# Patient Record
Sex: Male | Born: 1960 | Race: White | Hispanic: No | Marital: Single | Smoking: Never smoker
Health system: Southern US, Community
[De-identification: ages and names within clinical notes are randomized; demographics above are authoritative.]

## PROBLEM LIST (undated history)

## (undated) DIAGNOSIS — M5136 Other intervertebral disc degeneration, lumbar region: Secondary | ICD-10-CM

## (undated) DIAGNOSIS — M109 Gout, unspecified: Secondary | ICD-10-CM

## (undated) DIAGNOSIS — K219 Gastro-esophageal reflux disease without esophagitis: Secondary | ICD-10-CM

## (undated) DIAGNOSIS — F329 Major depressive disorder, single episode, unspecified: Secondary | ICD-10-CM

## (undated) DIAGNOSIS — F419 Anxiety disorder, unspecified: Secondary | ICD-10-CM

## (undated) DIAGNOSIS — M51369 Other intervertebral disc degeneration, lumbar region without mention of lumbar back pain or lower extremity pain: Secondary | ICD-10-CM

## (undated) DIAGNOSIS — F32A Depression, unspecified: Secondary | ICD-10-CM

## (undated) DIAGNOSIS — M1990 Unspecified osteoarthritis, unspecified site: Secondary | ICD-10-CM

## (undated) DIAGNOSIS — R112 Nausea with vomiting, unspecified: Secondary | ICD-10-CM

## (undated) DIAGNOSIS — Z9889 Other specified postprocedural states: Secondary | ICD-10-CM

## (undated) DIAGNOSIS — T8859XA Other complications of anesthesia, initial encounter: Secondary | ICD-10-CM

## (undated) DIAGNOSIS — T4145XA Adverse effect of unspecified anesthetic, initial encounter: Secondary | ICD-10-CM

## (undated) HISTORY — PX: BACK SURGERY: SHX140

---

## 1991-07-20 HISTORY — PX: HERNIA REPAIR: SHX51

## 1991-07-20 HISTORY — PX: APPENDECTOMY: SHX54

## 2002-05-14 ENCOUNTER — Emergency Department (HOSPITAL_COMMUNITY): Admission: EM | Admit: 2002-05-14 | Discharge: 2002-05-14 | Payer: Self-pay | Admitting: Emergency Medicine

## 2002-05-18 ENCOUNTER — Emergency Department (HOSPITAL_COMMUNITY): Admission: EM | Admit: 2002-05-18 | Discharge: 2002-05-18 | Payer: Self-pay | Admitting: *Deleted

## 2003-03-24 ENCOUNTER — Emergency Department (HOSPITAL_COMMUNITY): Admission: EM | Admit: 2003-03-24 | Discharge: 2003-03-25 | Payer: Self-pay | Admitting: *Deleted

## 2003-07-17 ENCOUNTER — Emergency Department (HOSPITAL_COMMUNITY): Admission: EM | Admit: 2003-07-17 | Discharge: 2003-07-17 | Payer: Self-pay | Admitting: Internal Medicine

## 2005-08-23 ENCOUNTER — Emergency Department (HOSPITAL_COMMUNITY): Admission: EM | Admit: 2005-08-23 | Discharge: 2005-08-23 | Payer: Self-pay | Admitting: Emergency Medicine

## 2007-06-06 ENCOUNTER — Emergency Department (HOSPITAL_COMMUNITY): Admission: EM | Admit: 2007-06-06 | Discharge: 2007-06-06 | Payer: Self-pay | Admitting: Emergency Medicine

## 2007-06-14 ENCOUNTER — Emergency Department (HOSPITAL_COMMUNITY): Admission: EM | Admit: 2007-06-14 | Discharge: 2007-06-14 | Payer: Self-pay | Admitting: Emergency Medicine

## 2008-07-19 HISTORY — PX: CHOLECYSTECTOMY: SHX55

## 2008-09-03 ENCOUNTER — Emergency Department (HOSPITAL_COMMUNITY): Admission: EM | Admit: 2008-09-03 | Discharge: 2008-09-04 | Payer: Self-pay | Admitting: Emergency Medicine

## 2008-10-07 ENCOUNTER — Ambulatory Visit (HOSPITAL_COMMUNITY): Admission: RE | Admit: 2008-10-07 | Discharge: 2008-10-07 | Payer: Self-pay | Admitting: Pulmonary Disease

## 2008-12-23 ENCOUNTER — Emergency Department (HOSPITAL_COMMUNITY): Admission: EM | Admit: 2008-12-23 | Discharge: 2008-12-23 | Payer: Self-pay | Admitting: Emergency Medicine

## 2009-02-12 ENCOUNTER — Emergency Department (HOSPITAL_COMMUNITY): Admission: EM | Admit: 2009-02-12 | Discharge: 2009-02-12 | Payer: Self-pay | Admitting: Emergency Medicine

## 2010-10-25 LAB — DIFFERENTIAL
Basophils Absolute: 0 10*3/uL (ref 0.0–0.1)
Basophils Relative: 1 % (ref 0–1)
Eosinophils Absolute: 0 10*3/uL (ref 0.0–0.7)
Eosinophils Relative: 1 % (ref 0–5)
Lymphocytes Relative: 26 % (ref 12–46)
Lymphs Abs: 1.2 10*3/uL (ref 0.7–4.0)
Monocytes Absolute: 0.3 10*3/uL (ref 0.1–1.0)
Monocytes Relative: 7 % (ref 3–12)
Neutro Abs: 2.9 10*3/uL (ref 1.7–7.7)
Neutrophils Relative %: 65 % (ref 43–77)

## 2010-10-25 LAB — CBC
HCT: 38.1 % — ABNORMAL LOW (ref 39.0–52.0)
Hemoglobin: 13.5 g/dL (ref 13.0–17.0)
MCHC: 35.5 g/dL (ref 30.0–36.0)
MCV: 88.2 fL (ref 78.0–100.0)
Platelets: 210 10*3/uL (ref 150–400)
RBC: 4.32 MIL/uL (ref 4.22–5.81)
RDW: 12.6 % (ref 11.5–15.5)
WBC: 4.5 10*3/uL (ref 4.0–10.5)

## 2010-10-25 LAB — COMPREHENSIVE METABOLIC PANEL
ALT: 12 U/L (ref 0–53)
AST: 22 U/L (ref 0–37)
Albumin: 3.9 g/dL (ref 3.5–5.2)
Alkaline Phosphatase: 49 U/L (ref 39–117)
BUN: 6 mg/dL (ref 6–23)
CO2: 31 mEq/L (ref 19–32)
Calcium: 8.8 mg/dL (ref 8.4–10.5)
Chloride: 100 mEq/L (ref 96–112)
Creatinine, Ser: 0.8 mg/dL (ref 0.4–1.5)
GFR calc Af Amer: >60 mL/min (ref >60)
GFR calc non Af Amer: >60 mL/min (ref >60)
Glucose, Bld: 86 mg/dL (ref 70–99)
Potassium: 3.6 mEq/L (ref 3.5–5.1)
Sodium: 135 mEq/L (ref 135–145)
Total Bilirubin: 1.4 mg/dL — ABNORMAL HIGH (ref 0.3–1.2)
Total Protein: 6.2 g/dL (ref 6.0–8.3)

## 2010-10-25 LAB — URINALYSIS, ROUTINE W REFLEX MICROSCOPIC
Bilirubin Urine: NEGATIVE
Glucose, UA: NEGATIVE mg/dL
Hgb urine dipstick: NEGATIVE
Ketones, ur: NEGATIVE mg/dL
Nitrite: NEGATIVE
Protein, ur: NEGATIVE mg/dL
Specific Gravity, Urine: <1.005 — ABNORMAL LOW (ref 1.005–1.030)
Urobilinogen, UA: 0.2 mg/dL (ref 0.0–1.0)
pH: 5.5 (ref 5.0–8.0)

## 2010-10-25 LAB — LIPASE, BLOOD: Lipase: 25 U/L (ref 11–59)

## 2010-11-03 LAB — URINALYSIS, ROUTINE W REFLEX MICROSCOPIC
Bilirubin Urine: NEGATIVE
Glucose, UA: NEGATIVE mg/dL
Hgb urine dipstick: NEGATIVE
Ketones, ur: NEGATIVE mg/dL
Nitrite: NEGATIVE
Protein, ur: NEGATIVE mg/dL
Specific Gravity, Urine: <1.005 — ABNORMAL LOW (ref 1.005–1.030)
Urobilinogen, UA: 0.2 mg/dL (ref 0.0–1.0)
pH: 5 (ref 5.0–8.0)

## 2010-11-03 LAB — CBC
HCT: 41.8 % (ref 39.0–52.0)
Hemoglobin: 14.5 g/dL (ref 13.0–17.0)
MCHC: 34.8 g/dL (ref 30.0–36.0)
MCV: 87.5 fL (ref 78.0–100.0)
Platelets: 210 10*3/uL (ref 150–400)
RBC: 4.77 MIL/uL (ref 4.22–5.81)
RDW: 12.5 % (ref 11.5–15.5)
WBC: 5.6 10*3/uL (ref 4.0–10.5)

## 2010-11-03 LAB — BASIC METABOLIC PANEL
BUN: 5 mg/dL — ABNORMAL LOW (ref 6–23)
CO2: 27 mEq/L (ref 19–32)
Calcium: 9.1 mg/dL (ref 8.4–10.5)
Chloride: 103 mEq/L (ref 96–112)
Creatinine, Ser: 0.9 mg/dL (ref 0.4–1.5)
GFR calc Af Amer: >60 mL/min (ref >60)
GFR calc non Af Amer: >60 mL/min (ref >60)
Glucose, Bld: 87 mg/dL (ref 70–99)
Potassium: 3.7 mEq/L (ref 3.5–5.1)
Sodium: 138 mEq/L (ref 135–145)

## 2010-11-03 LAB — DIFFERENTIAL
Basophils Absolute: 0 10*3/uL (ref 0.0–0.1)
Basophils Relative: 0 % (ref 0–1)
Eosinophils Absolute: 0.1 10*3/uL (ref 0.0–0.7)
Eosinophils Relative: 1 % (ref 0–5)
Lymphocytes Relative: 27 % (ref 12–46)
Lymphs Abs: 1.5 10*3/uL (ref 0.7–4.0)
Monocytes Absolute: 0.4 10*3/uL (ref 0.1–1.0)
Monocytes Relative: 8 % (ref 3–12)
Neutro Abs: 3.6 10*3/uL (ref 1.7–7.7)
Neutrophils Relative %: 64 % (ref 43–77)

## 2010-11-03 LAB — POCT CARDIAC MARKERS
CKMB, poc: 2 ng/mL (ref 1.0–8.0)
Myoglobin, poc: 132 ng/mL (ref 12–200)
Troponin i, poc: <0.05 ng/mL (ref 0.00–0.09)

## 2012-01-08 ENCOUNTER — Emergency Department (HOSPITAL_COMMUNITY)
Admission: EM | Admit: 2012-01-08 | Discharge: 2012-01-08 | Disposition: A | Discharge status: Home / Self Care | Payer: Self-pay | Attending: Emergency Medicine | Admitting: Emergency Medicine

## 2012-01-08 ENCOUNTER — Emergency Department (HOSPITAL_COMMUNITY): Payer: Self-pay

## 2012-01-08 ENCOUNTER — Encounter (HOSPITAL_COMMUNITY): Payer: Self-pay | Admitting: *Deleted

## 2012-01-08 DIAGNOSIS — R531 Weakness: Secondary | ICD-10-CM

## 2012-01-08 DIAGNOSIS — R51 Headache: Secondary | ICD-10-CM | POA: Insufficient documentation

## 2012-01-08 DIAGNOSIS — M109 Gout, unspecified: Secondary | ICD-10-CM | POA: Insufficient documentation

## 2012-01-08 DIAGNOSIS — R112 Nausea with vomiting, unspecified: Secondary | ICD-10-CM | POA: Insufficient documentation

## 2012-01-08 DIAGNOSIS — R5381 Other malaise: Secondary | ICD-10-CM | POA: Insufficient documentation

## 2012-01-08 DIAGNOSIS — R109 Unspecified abdominal pain: Secondary | ICD-10-CM | POA: Insufficient documentation

## 2012-01-08 HISTORY — DX: Unspecified osteoarthritis, unspecified site: M19.90

## 2012-01-08 HISTORY — DX: Gout, unspecified: M10.9

## 2012-01-08 LAB — DIFFERENTIAL
Basophils Absolute: 0 10*3/uL (ref 0.0–0.1)
Basophils Relative: 1 % (ref 0–1)
Eosinophils Absolute: 0.1 10*3/uL (ref 0.0–0.7)
Eosinophils Relative: 3 % (ref 0–5)
Lymphocytes Relative: 32 % (ref 12–46)
Lymphs Abs: 1.3 10*3/uL (ref 0.7–4.0)
Monocytes Absolute: 0.4 10*3/uL (ref 0.1–1.0)
Monocytes Relative: 9 % (ref 3–12)
Neutro Abs: 2.3 10*3/uL (ref 1.7–7.7)
Neutrophils Relative %: 56 % (ref 43–77)

## 2012-01-08 LAB — CBC
HCT: 39.2 % (ref 39.0–52.0)
Hemoglobin: 13.8 g/dL (ref 13.0–17.0)
MCH: 29.4 pg (ref 26.0–34.0)
MCHC: 35.2 g/dL (ref 30.0–36.0)
MCV: 83.4 fL (ref 78.0–100.0)
Platelets: 193 10*3/uL (ref 150–400)
RBC: 4.7 MIL/uL (ref 4.22–5.81)
RDW: 12 % (ref 11.5–15.5)
WBC: 4.1 10*3/uL (ref 4.0–10.5)

## 2012-01-08 LAB — URINALYSIS, ROUTINE W REFLEX MICROSCOPIC
Nitrite: NEGATIVE
Protein, ur: NEGATIVE mg/dL
Specific Gravity, Urine: 1.01 (ref 1.005–1.030)
Urobilinogen, UA: 0.2 mg/dL (ref 0.0–1.0)

## 2012-01-08 LAB — BASIC METABOLIC PANEL
BUN: 7 mg/dL (ref 6–23)
CO2: 30 mEq/L (ref 19–32)
Calcium: 9.6 mg/dL (ref 8.4–10.5)
Chloride: 97 mEq/L (ref 96–112)
Creatinine, Ser: 0.99 mg/dL (ref 0.50–1.35)
GFR calc Af Amer: >90 mL/min (ref >90)
GFR calc non Af Amer: >90 mL/min (ref >90)
Glucose, Bld: 83 mg/dL (ref 70–99)
Potassium: 4.2 mEq/L (ref 3.5–5.1)
Sodium: 134 mEq/L — ABNORMAL LOW (ref 135–145)

## 2012-01-08 LAB — CK: Total CK: 139 U/L (ref 7–232)

## 2012-01-08 LAB — TROPONIN I: Troponin I: <0.3 ng/mL (ref <0.30)

## 2012-01-08 MED ORDER — KETOROLAC TROMETHAMINE 30 MG/ML IJ SOLN
30.0000 mg | Freq: Once | INTRAMUSCULAR | Status: AC
  Administered 2012-01-08: 30 mg via INTRAVENOUS
  Filled 2012-01-08: qty 1

## 2012-01-08 MED ORDER — SODIUM CHLORIDE 0.9 % IV BOLUS (SEPSIS)
1000.0000 mL | Freq: Once | INTRAVENOUS | Status: AC
  Administered 2012-01-08: 1000 mL via INTRAVENOUS

## 2012-01-08 MED ORDER — PROMETHAZINE HCL 25 MG PO TABS: 25.0000 mg | ORAL_TABLET | Freq: Four times a day (QID) | ORAL | Status: DC | PRN

## 2012-01-08 MED ORDER — ONDANSETRON HCL 4 MG/2ML IJ SOLN
4.0000 mg | Freq: Once | INTRAMUSCULAR | Status: AC
  Administered 2012-01-08: 4 mg via INTRAVENOUS
  Filled 2012-01-08: qty 2

## 2012-01-08 NOTE — ED Provider Notes (Cosign Needed)
History    This chart was scribed for Ward Givens, MD, MD by Smitty Pluck. The patient was seen in room APA01 and the patient's care was started at 2:10PM.   CSN: [id number]  Arrival date & time 01/08/12  1253   First MD Initiated Contact with Patient 01/08/12 1352      Chief Complaint  Patient presents with  . Emesis  . Abdominal Pain    (Consider location/radiation/quality/duration/timing/severity/associated sxs/prior treatment) Patient is a 51 y.o. male presenting with vomiting and abdominal pain. The history is provided by the patient.  Emesis  Associated symptoms include abdominal pain.  Abdominal Pain The primary symptoms of the illness include abdominal pain and vomiting.   Montrey Buist Sortor is a 51 y.o. male who presents to the Emergency Department complaining of generalized weakness onset 6 days ago, abdominal pain and nausea with emesis onset 3 days ago. Pt reports having emesis 4x. Pt thinks he might have had a fever. The head pain is located on right side of head. He has had headaches like this in the past but this one is worse. Pain is throbbing. Denies visual changes. Pt denies trouble walking. Symptoms have been constant since onset. Pt denies radiation. He states he does not have energy like normal. Pt reports having mild cough. Denies sore throat and rhinorrhea. Denies sick contact. He has had cholecystomy and appendectomy.   PCP Dr. Juanetta Gosling   Past Medical History  Diagnosis Date  . Arthritis   . Gout     Past Surgical History  Procedure Date  . Cholecystectomy   . Appendectomy   . Hernia repair     No family history on file.  History  Substance Use Topics  . Smoking status: Never Smoker   . Smokeless tobacco: Not on file  . 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  All other systems reviewed and are negative.  10 Systems reviewed and all  are negative for acute change except as noted in the HPI.    Allergies  Penicillins  Home Medications   Current Outpatient Rx  Name Route Sig Dispense Refill  . PROMETHAZINE HCL 25 MG PO TABS Oral Take 1 tablet (25 mg total) by mouth every 6 (six) hours as needed for nausea. 6 tablet 0    BP 125/75  Pulse 60  Temp 97.6 F (36.4 C) (Oral)  Resp 16  Ht 5\' 8"  (1.727 m)  Wt 167 lb (75.751 kg)  BMI 25.39 kg/m2  SpO2 100%  Vital signs normal    Physical Exam  Nursing note and vitals reviewed. Constitutional: He is oriented to person, place, and time. He appears well-developed and well-nourished. He is cooperative. No distress.  HENT:  Head: Normocephalic and atraumatic.  Right Ear: External ear normal.  Left Ear: External ear normal.  Nose: Nose normal.  Mouth/Throat: Oropharynx is clear and moist.  Eyes: Conjunctivae and EOM are normal. Pupils are equal, round, and reactive to light.  Neck: Normal range of motion and full passive range of motion without pain. Neck supple.  Cardiovascular: Normal rate, regular rhythm, normal heart sounds and intact distal pulses.   No murmur heard. Pulmonary/Chest: Effort normal and breath sounds normal. Not tachypneic. No respiratory distress. He has no wheezes. He has no rales.  Abdominal: He exhibits no distension. There is tenderness (mild around umbilicus ).  Surgical scars around umbilicus  Musculoskeletal: Normal range of motion. He exhibits no edema and no tenderness.  Neurological: He is alert and oriented to person, place, and time. He has normal strength and normal reflexes. No cranial nerve deficit.  Skin: Skin is warm, dry and intact.  Psychiatric: His speech is normal and behavior is normal. Thought content normal.       Flat affect    ED Course  Procedures (including critical care time)   Medications  sodium chloride 0.9 % bolus 1,000 mL (1000 mL Intravenous Given 01/08/12 1453)   Orthostatic VS are  normal   DIAGNOSTIC STUDIES: Oxygen Saturation is 100% on room air, normal by my interpretation.    COORDINATION OF CARE: 2:17PM EDP discusses pt ED treatment with pt.   3:00PM EDP ordered medication: 0.9% NaCl infusion  Pt requesting work note  Results for orders placed during the hospital encounter of 01/08/12  CBC      Component Value Range   WBC 4.1  4.0 - 10.5 K/uL   RBC 4.70  4.22 - 5.81 MIL/uL   Hemoglobin 13.8  13.0 - 17.0 g/dL   HCT 16.1  09.6 - 04.5 %   MCV 83.4  78.0 - 100.0 fL   MCH 29.4  26.0 - 34.0 pg   MCHC 35.2  30.0 - 36.0 g/dL   RDW 40.9  81.1 - 91.4 %   Platelets 193  150 - 400 K/uL  DIFFERENTIAL      Component Value Range   Neutrophils Relative 56  43 - 77 %   Neutro Abs 2.3  1.7 - 7.7 K/uL   Lymphocytes Relative 32  12 - 46 %   Lymphs Abs 1.3  0.7 - 4.0 K/uL   Monocytes Relative 9  3 - 12 %   Monocytes Absolute 0.4  0.1 - 1.0 K/uL   Eosinophils Relative 3  0 - 5 %   Eosinophils Absolute 0.1  0.0 - 0.7 K/uL   Basophils Relative 1  0 - 1 %   Basophils Absolute 0.0  0.0 - 0.1 K/uL  BASIC METABOLIC PANEL      Component Value Range   Sodium 134 (*) 135 - 145 mEq/L   Potassium 4.2  3.5 - 5.1 mEq/L   Chloride 97  96 - 112 mEq/L   CO2 30  19 - 32 mEq/L   Glucose, Bld 83  70 - 99 mg/dL   BUN 7  6 - 23 mg/dL   Creatinine, Ser 7.82  0.50 - 1.35 mg/dL   Calcium 9.6  8.4 - 95.6 mg/dL   GFR calc non Af Amer >90  >90 mL/min   GFR calc Af Amer >90  >90 mL/min  TROPONIN I      Component Value Range   Troponin I <0.30  <0.30 ng/mL  URINALYSIS, ROUTINE W REFLEX MICROSCOPIC      Component Value Range   Color, Urine YELLOW  YELLOW   APPearance CLEAR  CLEAR   Specific Gravity, Urine 1.010  1.005 - 1.030   pH 7.0  5.0 - 8.0   Glucose, UA NEGATIVE  NEGATIVE mg/dL   Hgb urine dipstick NEGATIVE  NEGATIVE   Bilirubin Urine NEGATIVE  NEGATIVE   Ketones, ur NEGATIVE  NEGATIVE mg/dL   Protein, ur NEGATIVE  NEGATIVE mg/dL   Urobilinogen, UA 0.2  0.0 - 1.0 mg/dL    Nitrite NEGATIVE  NEGATIVE   Leukocytes, UA NEGATIVE  NEGATIVE  CK  Component Value Range   Total CK 139  7 - 232 U/L   Laboratory interpretation all normal   Dg Chest 2 View  01/08/2012  *RADIOLOGY REPORT*  Clinical Data: 51 year old male with weakness and cough.  CHEST - 2 VIEW  Comparison: 07/04/2011 prior chest radiographs  Findings: The cardiomediastinal silhouette is unremarkable. The lungs are clear. There is no evidence of focal airspace disease, pulmonary edema, suspicious pulmonary nodule/mass, pleural effusion, or pneumothorax. No acute bony abnormalities are identified.  IMPRESSION: No evidence of active cardiopulmonary disease.  Original Report Authenticated By: Rosendo Gros, M.D.    Date: 01/08/2012  Rate: 51  Rhythm: sinus bradycardia  QRS Axis: normal  Intervals: normal  ST/T Wave abnormalities: normal  Conduction Disutrbances:none  Narrative Interpretation:   Old EKG Reviewed: unchanged from 09/03/2008 HR was 61     1. Weakness   2. Nausea and vomiting   3. Headache    New Prescriptions   PROMETHAZINE (PHENERGAN) 25 MG TABLET    Take 1 tablet (25 mg total) by mouth every 6 (six) hours as needed for nausea.    Plan discharge  Devoria Albe, MD, FACEP    MDM    I personally performed the services described in this documentation, which was scribed in my presence. The recorded information has been reviewed and considered.  Devoria Albe, MD, Armando Gang        Ward Givens, MD 01/08/12 [phone]

## 2012-01-08 NOTE — Discharge Instructions (Signed)
Drink plenty of fluids (clear liquids) the next 12-24 hours then start the BRAT diet. Use the phenergan for nausea or vomiting.  Recheck if you get worse. Have Dr Juanetta Gosling recheck you if you aren't feeling better over the weekend.

## 2012-01-08 NOTE — ED Notes (Signed)
Patient advised he did have some slight dizziness upon sitting upright and standing.

## 2012-01-08 NOTE — ED Notes (Signed)
Pt states vomiting, nausea and abdominal pain began Sunday, along with generalized weakness and headache. NAD.

## 2012-04-14 ENCOUNTER — Emergency Department (HOSPITAL_COMMUNITY): Payer: Self-pay

## 2012-04-14 ENCOUNTER — Emergency Department (HOSPITAL_COMMUNITY)
Admission: EM | Admit: 2012-04-14 | Discharge: 2012-04-14 | Disposition: A | Discharge status: Home / Self Care | Payer: Self-pay | Attending: Emergency Medicine | Admitting: Emergency Medicine

## 2012-04-14 ENCOUNTER — Encounter (HOSPITAL_COMMUNITY): Payer: Self-pay | Admitting: Emergency Medicine

## 2012-04-14 DIAGNOSIS — X500XXA Overexertion from strenuous movement or load, initial encounter: Secondary | ICD-10-CM | POA: Insufficient documentation

## 2012-04-14 DIAGNOSIS — S93409A Sprain of unspecified ligament of unspecified ankle, initial encounter: Secondary | ICD-10-CM | POA: Insufficient documentation

## 2012-04-14 DIAGNOSIS — M129 Arthropathy, unspecified: Secondary | ICD-10-CM | POA: Insufficient documentation

## 2012-04-14 DIAGNOSIS — S93402A Sprain of unspecified ligament of left ankle, initial encounter: Secondary | ICD-10-CM

## 2012-04-14 DIAGNOSIS — Y990 Civilian activity done for income or pay: Secondary | ICD-10-CM | POA: Insufficient documentation

## 2012-04-14 DIAGNOSIS — Y9239 Other specified sports and athletic area as the place of occurrence of the external cause: Secondary | ICD-10-CM | POA: Insufficient documentation

## 2012-04-14 DIAGNOSIS — M109 Gout, unspecified: Secondary | ICD-10-CM | POA: Insufficient documentation

## 2012-04-14 MED ORDER — OXYCODONE-ACETAMINOPHEN 5-325 MG PO TABS: 1.0000 | ORAL_TABLET | ORAL | Status: DC | PRN

## 2012-04-14 NOTE — ED Provider Notes (Signed)
History     CSN: [id number]  Arrival date & time 04/14/12  0029   First MD Initiated Contact with Patient 04/14/12 0141      Chief Complaint  Patient presents with  . Ankle Pain     Patient is a 51 y.o. male presenting with ankle pain. The history is provided by the patient.  Ankle Pain  The incident occurred yesterday. The incident occurred at work. Injury mechanism: twisting injury. The pain is present in the left ankle. The pain is moderate. The pain has been constant since onset. Pertinent negatives include no muscle weakness. The symptoms are aggravated by activity and bearing weight. He has tried rest for the symptoms. The treatment provided mild relief.    Past Medical History  Diagnosis Date  . Arthritis   . Gout   . Gout     Past Surgical History  Procedure Date  . Cholecystectomy   . Appendectomy   . Hernia repair     No family history on file.  History  Substance Use Topics  . Smoking status: Never Smoker   . Smokeless tobacco: Not on file  . Alcohol Use: No      Review of Systems  Musculoskeletal: Positive for joint swelling.  Neurological: Negative for weakness.    Allergies  Penicillins  Home Medications   Current Outpatient Rx  Name Route Sig Dispense Refill  . OXYCODONE-ACETAMINOPHEN 5-325 MG PO TABS Oral Take 1 tablet by mouth every 4 (four) hours as needed for pain. 15 tablet 0  . PROMETHAZINE HCL 25 MG PO TABS Oral Take 1 tablet (25 mg total) by mouth every 6 (six) hours as needed for nausea. 6 tablet 0    BP 152/88  Pulse 69  Temp 98 F (36.7 C) (Oral)  Resp 18  Ht 5' 8.5" (1.74 m)  Wt 171 lb (77.565 kg)  BMI 25.62 kg/m2  SpO2 100%  Physical Exam CONSTITUTIONAL: Well developed/well nourished HEAD AND FACE: Normocephalic/atraumatic EYES: EOMI ENMT: Mucous membranes moist NECK: supple no meningeal signs LUNGS: , no apparent distress ABDOMEN: soft NEURO: Pt is awake/alert, moves all extremitiesx4 EXTREMITIES: pulses  normal, full ROM, tenderness to palpation of left lateral malleolus, no open skin, no left prox fib tenderness.  Left achilles intact SKIN: warm, color normal PSYCH: no abnormalities of mood noted  ED Course  Procedures  Labs Reviewed - No data to display Dg Ankle Complete Left  04/14/2012  *RADIOLOGY REPORT*  Clinical Data: 51 year old male medial malleolus pain, redness, swelling.  LEFT ANKLE COMPLETE - 3+ VIEW  Comparison: None.  Findings: Mortise joint alignment preserved.  Talar dome intact. Chronic ossific fragments at the medial and lateral malleolus. Evidence of ankle joint effusion.  Moderate to severe degenerative changes in the tarsal articulations.  Calcaneus intact.  Calcified atherosclerosis.  There is soft tissue swelling medially.  IMPRESSION: Soft tissue swelling and evidence of joint effusion.  No acute fracture or dislocation.  Age advanced mid foot degenerative changes and atherosclerosis.   Original Report Authenticated By: Harley Hallmark, M.D.      1. Left ankle sprain       MDM  Nursing notes including past medical history and social history reviewed and considered in documentation xrays reviewed and considered         Joya Gaskins, MD 04/14/12 [phone]

## 2012-04-14 NOTE — ED Notes (Signed)
Patient c/o left ankle pain x 2 days.

## 2012-04-14 NOTE — ED Notes (Signed)
Pt reports left ankle pain that has increased over the past 2 days. Pt states he is on his feet for 8 hours a day. Pt thinks it may be swollen some. No injury or new activity.

## 2012-04-14 NOTE — ED Notes (Signed)
Pt alert & oriented x4, Patient given discharge instructions, paperwork & prescription(s). Patient instructed to stop at the registration desk to finish any additional paperwork. Patient verbalized understanding. Pt left department w/ no further questions.  

## 2012-04-20 ENCOUNTER — Ambulatory Visit: Payer: Self-pay | Admitting: Orthopedic Surgery

## 2012-05-28 ENCOUNTER — Encounter (HOSPITAL_COMMUNITY): Payer: Self-pay | Admitting: Emergency Medicine

## 2012-05-28 ENCOUNTER — Emergency Department (HOSPITAL_COMMUNITY): Payer: Self-pay

## 2012-05-28 ENCOUNTER — Emergency Department (HOSPITAL_COMMUNITY)
Admission: EM | Admit: 2012-05-28 | Discharge: 2012-05-28 | Disposition: A | Discharge status: Home / Self Care | Payer: Self-pay | Attending: Emergency Medicine | Admitting: Emergency Medicine

## 2012-05-28 DIAGNOSIS — M25519 Pain in unspecified shoulder: Secondary | ICD-10-CM | POA: Insufficient documentation

## 2012-05-28 DIAGNOSIS — Z8639 Personal history of other endocrine, nutritional and metabolic disease: Secondary | ICD-10-CM | POA: Insufficient documentation

## 2012-05-28 DIAGNOSIS — Z862 Personal history of diseases of the blood and blood-forming organs and certain disorders involving the immune mechanism: Secondary | ICD-10-CM | POA: Insufficient documentation

## 2012-05-28 DIAGNOSIS — M129 Arthropathy, unspecified: Secondary | ICD-10-CM | POA: Insufficient documentation

## 2012-05-28 MED ORDER — HYDROCODONE-ACETAMINOPHEN 5-325 MG PO TABS
2.0000 | ORAL_TABLET | Freq: Once | ORAL | Status: AC
  Administered 2012-05-28: 2 via ORAL
  Filled 2012-05-28: qty 2

## 2012-05-28 MED ORDER — HYDROCODONE-ACETAMINOPHEN 5-325 MG PO TABS: 1.0000 | ORAL_TABLET | ORAL | Status: AC | PRN

## 2012-05-28 NOTE — ED Notes (Signed)
Patient transported to X-ray 

## 2012-05-28 NOTE — ED Notes (Signed)
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
## 2012-05-28 NOTE — ED Notes (Signed)
Pt states he started having right shoulder pain about 1 week ago.  States he has a history of shoulder pain, but this time his right shoulder pain is increased.

## 2012-06-11 ENCOUNTER — Encounter (HOSPITAL_COMMUNITY): Payer: Self-pay | Admitting: *Deleted

## 2012-06-11 ENCOUNTER — Emergency Department (HOSPITAL_COMMUNITY): Payer: Self-pay

## 2012-06-11 ENCOUNTER — Emergency Department (HOSPITAL_COMMUNITY)
Admission: EM | Admit: 2012-06-11 | Discharge: 2012-06-11 | Disposition: A | Discharge status: Home / Self Care | Payer: Self-pay | Attending: Emergency Medicine | Admitting: Emergency Medicine

## 2012-06-11 DIAGNOSIS — Z79899 Other long term (current) drug therapy: Secondary | ICD-10-CM | POA: Insufficient documentation

## 2012-06-11 DIAGNOSIS — M109 Gout, unspecified: Secondary | ICD-10-CM | POA: Insufficient documentation

## 2012-06-11 DIAGNOSIS — M129 Arthropathy, unspecified: Secondary | ICD-10-CM | POA: Insufficient documentation

## 2012-06-11 MED ORDER — PREDNISONE 10 MG PO TABS: ORAL_TABLET | ORAL | Status: DC

## 2012-06-11 MED ORDER — PREDNISONE 50 MG PO TABS
60.0000 mg | ORAL_TABLET | Freq: Once | ORAL | Status: AC
  Administered 2012-06-11: 60 mg via ORAL
  Filled 2012-06-11: qty 1

## 2012-06-11 MED ORDER — OXYCODONE-ACETAMINOPHEN 5-325 MG PO TABS: 1.0000 | ORAL_TABLET | ORAL | Status: DC | PRN

## 2012-06-11 NOTE — ED Notes (Signed)
Swelling to left hand x 3 days.  Denies injury.

## 2012-06-11 NOTE — ED Provider Notes (Signed)
History     CSN: [id number]  Arrival date & time 06/11/12  1139   First MD Initiated Contact with Patient 06/11/12 1305      Chief Complaint  Patient presents with  . Hand Pain    (Consider location/radiation/quality/duration/timing/severity/associated sxs/prior treatment) HPI Comments: Jared Beck presents with a 3 day history of increased pain,  Swelling and redness to his left 4th finger and dorsal hand which he states is consistent with an acute gouty flare.  His pain is constant and without radiation.  He has taken motrin with no relief of symptoms. He denies fever and  injury. He does have a history of chronic deformity and decreased mobility of his 4th and 5th fingers on this hand secondary to arthritis changes and gout tophi which is not new or different today.  He has taken colchicine and allopurinol in the past for chronic suppression of his gout, but not in recent months.    The history is provided by the patient.    Past Medical History  Diagnosis Date  . Arthritis   . Gout   . Gout     Past Surgical History  Procedure Date  . Cholecystectomy   . Appendectomy   . Hernia repair     No family history on file.  History  Substance Use Topics  . Smoking status: Never Smoker   . Smokeless tobacco: Not on file  . Alcohol Use: No      Review of Systems  Musculoskeletal: Positive for joint swelling and arthralgias.  Skin: Positive for color change. Negative for rash and wound.  Neurological: Negative for weakness and numbness.    Allergies  Penicillins  Home Medications   Current Outpatient Rx  Name  Route  Sig  Dispense  Refill  . IBUPROFEN 200 MG PO TABS   Oral   Take 400 mg by mouth every 8 (eight) hours as needed. pain         . OXYCODONE-ACETAMINOPHEN 5-325 MG PO TABS   Oral   Take 1 tablet by mouth every 4 (four) hours as needed for pain.   20 tablet   0   . PREDNISONE 10 MG PO TABS      6, 5, 4, 3, 2 then 1 tablet by mouth daily  for 6 days total.   21 tablet   0     BP 120/86  Pulse 82  Temp 97.2 F (36.2 C) (Oral)  Resp 16  Ht 5\' 8"  (1.727 m)  Wt 172 lb (78.019 kg)  BMI 26.15 kg/m2  SpO2 99%  Physical Exam  Constitutional: He appears well-developed and well-nourished.  HENT:  Head: Atraumatic.  Neck: Normal range of motion.  Cardiovascular:       Pulses equal bilaterally  Musculoskeletal: He exhibits edema and tenderness.  Lymphadenopathy:    He has no cervical adenopathy.    He has no axillary adenopathy.       Left: No epitrochlear adenopathy present.  Neurological: He is alert. He has normal strength. He displays normal reflexes. No sensory deficit.       Equal strength  Skin: Skin is warm and dry. There is erythema.       Slight erythema and edema involving pip of left 4th finger with erythema and edema with increased warm to dorsal base of left hand.  There is no rash, lesions or evidence of skin disruption or injury.  Radial pulse intact.  Less than 3 sec cap refill.  Tophi  noted on 4th and 5th dip joints.  TTP.  Psychiatric: He has a normal mood and affect.    ED Course  Procedures (including critical care time)  Labs Reviewed - No data to display Dg Hand Complete Left  06/11/2012  *RADIOLOGY REPORT*  Clinical Data: Pain and swelling.  History rheumatoid arthritis and gout.  LEFT HAND - COMPLETE 3+ VIEW  Comparison: Plain films of the left wrist 06/14/2007.  Findings: Soft tissues and appear swollen.  No fracture or dislocation is identified.  There is widening of the scapholunate interval consistent with scapholunate ligament tear. Associated proximal migration of the capitate is noted.  Advanced degenerative disease is seen at the base of the thumb.  Chondrocalcinosis of the triangular fibrocartilage is identified. No definite erosion is seen.  IMPRESSION:  1.  Soft tissue swelling without acute bony abnormality. 2.  Widened scapholunate interval consistent with ligament tear. Proximal  migration of the capitate is consistent with scapholunate advanced collapse (SLAC wrist) 3.  Severe first CMC degenerative disease. 4.  Chondrocalcinosis.   Original Report Authenticated By: Holley Dexter, M.D.      1. Gout attack       MDM  Pt prescribed oxycodone and prednisone taper,  First dose given in ed.  Encouraged elevation, warm compresses.  Referral to pcp,  Also encouraged recheck here if not improved over the next several days.   xrays reviewed and discussed with patient.  No findings on xray that does not appear chronic.     Burgess Amor, PA 06/11/12 2124  Burgess Amor, PA 06/11/12 2126

## 2012-06-12 NOTE — ED Provider Notes (Signed)
`  Medical screening examination/treatment/procedure(s) were performed by non-physician practitioner and as supervising physician I was immediately available for consultation/collaboration.   Benny Lennert, MD 06/12/12 [phone]

## 2012-06-17 ENCOUNTER — Encounter (HOSPITAL_COMMUNITY): Payer: Self-pay

## 2012-06-17 ENCOUNTER — Emergency Department (HOSPITAL_COMMUNITY): Payer: Self-pay

## 2012-06-17 ENCOUNTER — Emergency Department (HOSPITAL_COMMUNITY)
Admission: EM | Admit: 2012-06-17 | Discharge: 2012-06-17 | Disposition: A | Discharge status: Home / Self Care | Payer: Self-pay | Attending: Emergency Medicine | Admitting: Emergency Medicine

## 2012-06-17 DIAGNOSIS — S0181XA Laceration without foreign body of other part of head, initial encounter: Secondary | ICD-10-CM

## 2012-06-17 DIAGNOSIS — S0180XA Unspecified open wound of other part of head, initial encounter: Secondary | ICD-10-CM | POA: Insufficient documentation

## 2012-06-17 DIAGNOSIS — Z23 Encounter for immunization: Secondary | ICD-10-CM | POA: Insufficient documentation

## 2012-06-17 DIAGNOSIS — S0993XA Unspecified injury of face, initial encounter: Secondary | ICD-10-CM

## 2012-06-17 DIAGNOSIS — S4980XA Other specified injuries of shoulder and upper arm, unspecified arm, initial encounter: Secondary | ICD-10-CM | POA: Insufficient documentation

## 2012-06-17 DIAGNOSIS — Z8639 Personal history of other endocrine, nutritional and metabolic disease: Secondary | ICD-10-CM | POA: Insufficient documentation

## 2012-06-17 DIAGNOSIS — S0990XA Unspecified injury of head, initial encounter: Secondary | ICD-10-CM | POA: Insufficient documentation

## 2012-06-17 DIAGNOSIS — S46909A Unspecified injury of unspecified muscle, fascia and tendon at shoulder and upper arm level, unspecified arm, initial encounter: Secondary | ICD-10-CM | POA: Insufficient documentation

## 2012-06-17 DIAGNOSIS — Z8739 Personal history of other diseases of the musculoskeletal system and connective tissue: Secondary | ICD-10-CM | POA: Insufficient documentation

## 2012-06-17 DIAGNOSIS — Z862 Personal history of diseases of the blood and blood-forming organs and certain disorders involving the immune mechanism: Secondary | ICD-10-CM | POA: Insufficient documentation

## 2012-06-17 DIAGNOSIS — S298XXA Other specified injuries of thorax, initial encounter: Secondary | ICD-10-CM | POA: Insufficient documentation

## 2012-06-17 MED ORDER — HYDROCODONE-ACETAMINOPHEN 5-325 MG PO TABS: 1.0000 | ORAL_TABLET | Freq: Four times a day (QID) | ORAL | Status: DC | PRN

## 2012-06-17 MED ORDER — OXYCODONE-ACETAMINOPHEN 5-325 MG PO TABS
1.0000 | ORAL_TABLET | Freq: Once | ORAL | Status: AC
  Administered 2012-06-17: 1 via ORAL
  Filled 2012-06-17: qty 1

## 2012-06-17 MED ORDER — TETANUS-DIPHTH-ACELL PERTUSSIS 5-2.5-18.5 LF-MCG/0.5 IM SUSP
0.5000 mL | Freq: Once | INTRAMUSCULAR | Status: AC
  Administered 2012-06-17: 0.5 mL via INTRAMUSCULAR
  Filled 2012-06-17: qty 0.5

## 2012-06-17 MED ORDER — LIDOCAINE-EPINEPHRINE 2 %-1:100000 IJ SOLN: 20.0000 mL | Freq: Once | INTRAMUSCULAR | Status: DC

## 2012-06-17 MED ORDER — LIDOCAINE-EPINEPHRINE (PF) 2 %-1:200000 IJ SOLN
INTRAMUSCULAR | Status: AC
  Administered 2012-06-17: 14:00:00
  Filled 2012-06-17: qty 20

## 2012-06-17 MED ORDER — IBUPROFEN 800 MG PO TABS
800.0000 mg | ORAL_TABLET | Freq: Once | ORAL | Status: AC
  Administered 2012-06-17: 800 mg via ORAL
  Filled 2012-06-17: qty 1
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
## 2012-06-17 NOTE — ED Provider Notes (Signed)
History/physical exam/procedure(s) were performed by non-physician practitioner and as supervising physician I was immediately available for consultation/collaboration. I have reviewed all notes and am in agreement with care and plan.   Hilario Quarry, MD 06/17/12 [phone]

## 2012-06-24 ENCOUNTER — Encounter (HOSPITAL_COMMUNITY): Payer: Self-pay | Admitting: Emergency Medicine

## 2012-06-24 ENCOUNTER — Emergency Department (HOSPITAL_COMMUNITY)
Admission: EM | Admit: 2012-06-24 | Discharge: 2012-06-24 | Disposition: A | Discharge status: Home / Self Care | Payer: Self-pay | Attending: Emergency Medicine | Admitting: Emergency Medicine

## 2012-06-24 DIAGNOSIS — Z9889 Other specified postprocedural states: Secondary | ICD-10-CM | POA: Insufficient documentation

## 2012-06-24 DIAGNOSIS — Z8639 Personal history of other endocrine, nutritional and metabolic disease: Secondary | ICD-10-CM | POA: Insufficient documentation

## 2012-06-24 DIAGNOSIS — Z862 Personal history of diseases of the blood and blood-forming organs and certain disorders involving the immune mechanism: Secondary | ICD-10-CM | POA: Insufficient documentation

## 2012-06-24 DIAGNOSIS — M25519 Pain in unspecified shoulder: Secondary | ICD-10-CM | POA: Insufficient documentation

## 2012-06-24 DIAGNOSIS — Z4802 Encounter for removal of sutures: Secondary | ICD-10-CM | POA: Insufficient documentation

## 2012-06-24 DIAGNOSIS — G8929 Other chronic pain: Secondary | ICD-10-CM

## 2012-06-24 DIAGNOSIS — Z8739 Personal history of other diseases of the musculoskeletal system and connective tissue: Secondary | ICD-10-CM | POA: Insufficient documentation

## 2012-06-24 NOTE — ED Notes (Signed)
Pt with continued pain to right shoulder, pt has right arm in sling; here for suture removal above right eye as well, site WNL; suture removal tray at bedside

## 2012-06-24 NOTE — ED Provider Notes (Signed)
Medical screening examination/treatment/procedure(s) were performed by non-physician practitioner and as supervising physician I was immediately available for consultation/collaboration.   Ariyan Sinnett B. Bernette Mayers, MD 06/24/12 1414

## 2012-06-24 NOTE — ED Provider Notes (Signed)
History     CSN: [id number]  Arrival date & time 06/24/12  1319   First MD Initiated Contact with Patient 06/24/12 1353      Chief Complaint  Patient presents with  . Shoulder Pain  . Suture / Staple Removal    (Consider location/radiation/quality/duration/timing/severity/associated sxs/prior treatment) HPI Comments: Pt here for suture  Removal from below R eyebrow.  Patient is a 51 y.o. male presenting with shoulder pain and suture removal. The history is provided by the patient. No language interpreter was used.  Shoulder Pain This is a chronic problem. Episode onset: states he has had problems with R shoulder for a long time.  he's also had two injuries to it in ~ one month. The problem occurs constantly. The problem has been gradually improving. Exacerbated by: movement. He has tried nothing for the symptoms.  Suture / Staple Removal     Past Medical History  Diagnosis Date  . Arthritis   . Gout   . Gout     Past Surgical History  Procedure Date  . Cholecystectomy   . Appendectomy   . Hernia repair     No family history on file.  History  Substance Use Topics  . Smoking status: Never Smoker   . Smokeless tobacco: Not on file  . Alcohol Use: No      Review of Systems  Musculoskeletal:       Shoulder pain   Skin: Positive for wound.  All other systems reviewed and are negative.    Allergies  Penicillins  Home Medications   Current Outpatient Rx  Name  Route  Sig  Dispense  Refill  . NAPROXEN SODIUM 220 MG PO TABS   Oral   Take 220 mg by mouth 2 (two) times daily as needed. For pain           BP 111/71  Pulse 70  Temp 98.1 F (36.7 C) (Oral)  Resp 20  SpO2 100%  Physical Exam  Nursing note and vitals reviewed. Constitutional: He is oriented to person, place, and time. He appears well-developed and well-nourished.  HENT:  Head: Normocephalic and atraumatic.  Eyes: EOM are normal.    Neck: Normal range of motion.    Cardiovascular: Normal rate, regular rhythm and intact distal pulses.   Pulmonary/Chest: Effort normal. No respiratory distress.  Abdominal: Soft. He exhibits no distension. There is no tenderness.  Musculoskeletal: Normal range of motion.  Neurological: He is alert and oriented to person, place, and time.  Skin: Skin is warm and dry.  Psychiatric: He has a normal mood and affect. Judgment normal.    ED Course  Procedures (including critical care time)  Labs Reviewed - No data to display No results found.   1. Visit for suture removal   2. Chronic right shoulder pain       MDM  F/u with dr. Hilda Lias prn        Evalina Field, PA 06/24/12 1410
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
## 2012-07-19 HISTORY — PX: OTHER SURGICAL HISTORY: SHX169

## 2012-10-15 DIAGNOSIS — M109 Gout, unspecified: Secondary | ICD-10-CM | POA: Insufficient documentation

## 2012-10-16 ENCOUNTER — Encounter (HOSPITAL_COMMUNITY): Payer: Self-pay | Admitting: Emergency Medicine

## 2012-10-16 ENCOUNTER — Emergency Department (HOSPITAL_COMMUNITY)
Admission: EM | Admit: 2012-10-16 | Discharge: 2012-10-16 | Disposition: A | Discharge status: Home / Self Care | Payer: BC Managed Care – PPO | Attending: Emergency Medicine | Admitting: Emergency Medicine

## 2012-10-16 DIAGNOSIS — M109 Gout, unspecified: Secondary | ICD-10-CM

## 2012-10-16 MED ORDER — OXYCODONE-ACETAMINOPHEN 5-325 MG PO TABS
1.0000 | ORAL_TABLET | Freq: Once | ORAL | Status: AC
  Administered 2012-10-16: 1 via ORAL
  Filled 2012-10-16: qty 1

## 2012-10-16 MED ORDER — COLCHICINE 0.6 MG PO TABS: 0.6000 mg | ORAL_TABLET | Freq: Two times a day (BID) | ORAL | Status: DC

## 2012-10-16 MED ORDER — OXYCODONE-ACETAMINOPHEN 5-325 MG PO TABS: 1.0000 | ORAL_TABLET | ORAL | Status: DC | PRN

## 2012-10-16 MED ORDER — COLCHICINE 0.6 MG PO TABS
0.6000 mg | ORAL_TABLET | Freq: Once | ORAL | Status: AC
  Administered 2012-10-16: 0.6 mg via ORAL
  Filled 2012-10-16: qty 1

## 2012-10-16 MED ORDER — ONDANSETRON 8 MG PO TBDP: 8.0000 mg | ORAL_TABLET | Freq: Three times a day (TID) | ORAL | Status: DC | PRN

## 2012-10-16 NOTE — ED Notes (Signed)
Patient c/o redness and swelling to right knee, right ankle and right foot.  Patient has history of gout.
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
## 2012-10-19 NOTE — ED Provider Notes (Signed)
Medical screening examination/treatment/procedure(s) were performed by non-physician practitioner and as supervising physician I was immediately available for consultation/collaboration.  Nicoletta Dress. Colon Branch, MD 10/19/12 [phone]

## 2013-02-13 DIAGNOSIS — Z880 Allergy status to penicillin: Secondary | ICD-10-CM | POA: Insufficient documentation

## 2013-04-27 ENCOUNTER — Ambulatory Visit (HOSPITAL_COMMUNITY)
Admission: RE | Admit: 2013-04-27 | Discharge: 2013-04-27 | Disposition: A | Discharge status: Home / Self Care | Payer: BC Managed Care – PPO | Source: Ambulatory Visit | Attending: Orthopedic Surgery | Admitting: Orthopedic Surgery

## 2013-04-27 DIAGNOSIS — M75122 Complete rotator cuff tear or rupture of left shoulder, not specified as traumatic: Secondary | ICD-10-CM

## 2013-04-27 DIAGNOSIS — M25512 Pain in left shoulder: Secondary | ICD-10-CM

## 2013-04-27 DIAGNOSIS — M6281 Muscle weakness (generalized): Secondary | ICD-10-CM

## 2013-04-27 DIAGNOSIS — IMO0001 Reserved for inherently not codable concepts without codable children: Secondary | ICD-10-CM | POA: Insufficient documentation

## 2013-04-27 DIAGNOSIS — M25519 Pain in unspecified shoulder: Secondary | ICD-10-CM | POA: Insufficient documentation

## 2013-04-27 DIAGNOSIS — M75120 Complete rotator cuff tear or rupture of unspecified shoulder, not specified as traumatic: Secondary | ICD-10-CM | POA: Insufficient documentation

## 2013-04-27 NOTE — Evaluation (Signed)
Occupational Therapy Evaluation  Patient Details  Name: Jared Beck MRN: [id number] Date of Birth: [birth year]  Today's Date: 04/27/2013 Time: 1315-1350 OT Time Calculation (min): 35 min OT Evaluation 35' Visit#: 1 of 16  Re-eval: 05/25/13  Assessment Diagnosis: S/P Left RCR Surgical Date: 03/14/13 Next MD Visit: 05/16/13 with Dr. Marquis Beck Prior Therapy: n/a  Authorization: n/a  Authorization Time Period:    Authorization Visit#:   of     Past Medical History:  Past Medical History  Diagnosis Date  . Arthritis   . Gout   . Gout    Past Surgical History:  Past Surgical History  Procedure Laterality Date  . Cholecystectomy    . Appendectomy    . Hernia repair      Subjective  S:  I got kicked by a cow at work, and the injury caused my problem with my rotator cuff.   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  He consulted with his MD and was diagnosed with a ruptured left rotator cuff.  He had surgery on 03/14/13 to repair his left rotator cuff.  He has been referred to occupational therapy for evaluation and treatment. Limitations: PROM progressing to AAROM and AROM as tolerated Special Tests: FOTO score 37 Patient Stated Goals: I want to be back to normal Pain Assessment Currently in Pain?: Yes Pain Score: 5  Pain Location: Shoulder Pain Orientation: Left Pain Type: Acute pain  Precautions/Restrictions  Precautions Precautions: None Restrictions Weight Bearing Restrictions: No  Balance Screening Balance Screen Has the patient fallen in the past 6 months: No Has the patient had a decrease in activity level because of a fear of falling? : No Is the patient reluctant to leave their home because of a fear of falling? : No  Prior Functioning  Prior Function Driving: Yes Vocation: Full time employment Vocation Requirements: worked as a farm hand, he has been let go of  this job and is seeking employment currently Comments: enjoys watching TV and Presenter, broadcasting, he lives with his fiance.  Assessment ADL/Vision/Perception ADL ADL Comments: Unable to use his left arm to lift anything heavy, he is not able to reach overhead. Dominant Hand: Right Vision - History Baseline Vision: No visual deficits  Cognition/Observation Cognition Overall Cognitive Status: Within Functional Limits for tasks assessed  Sensation/Coordination/Edema Sensation Light Touch: Appears Intact Coordination Gross Motor Movements are Fluid and Coordinated: Yes Fine Motor Movements are Fluid and Coordinated: Yes  Additional Assessments LUE AROM (degrees) LUE Overall AROM Comments: not assessed LUE PROM (degrees) LUE Overall PROM Comments: assessed in supine, ER/IR with shoulder adducted Left Shoulder Flexion: 98 Degrees Left Shoulder ABduction: 80 Degrees Left Shoulder Internal Rotation: 52 Degrees Left Shoulder External Rotation: 50 Degrees LUE Strength LUE Overall Strength Comments: not assessed due to recent surgery Grip (lbs): 18 (right 60) Palpation Palpation: mod-max fascial restrictions in his left upper arm, shoulder, and scapular region.     Exercise/Treatments    Manual Therapy Manual Therapy: Myofascial release Myofascial Release: MFR and manual stretching to left upper arm, shoulder, and scapular region to assess fascial restrictions.   Occupational Therapy Assessment and Plan OT Assessment and Plan Clinical Impression Statement: A:  Patient presents with decreased A/PROM and strength and increased pain and fascial restrictions s/p left rcr surgery.  Patients deficts are causing decreased indepdendence with his B/IADLs, work, and leisure activities.  Pt will benefit from  skilled therapeutic intervention in order to improve on the following deficits: Decreased range of motion;Decreased strength;Increased muscle spasms;Increased fascial restricitons;Pain Rehab  Potential: Good OT Frequency: Min 2X/week OT Duration: 8 weeks OT Treatment/Interventions: Self-care/ADL training;Therapeutic exercise;Manual therapy;Modalities;Therapeutic activities;Patient/family education OT Plan: P:  Skilled OT intervention to decrease pain and fascial restrictions and increase pain free mobility needed to return to prior level of independence with all B/IADLs, work, and leisure activities.  Treatment Plan:  MFR and manual stretching, PROM progressing to AAROM in supine, isometric strengthening, seated ext, elev, row, ball stretches, thumb tacks, prot/ret//elev/dep.    Goals Short Term Goals Time to Complete Short Term Goals: 4 weeks Short Term Goal 1: Patient will be educated on HEP. Short Term Goal 2: Patient will increase left shoulder PROM to [facility] for increased ability to reach overhead into cabinets. Short Term Goal 3: Patient will increase left shoulder strength to 3+/5 for increased ability to lift yardwork tools. Short Term Goal 4: Patient will decrease pain in left shoulder to 3/10 when completing functional activities.  Short Term Goal 5: Patient will decrease fascial restrictions to mod in his left shoulder region.  Additional Short Term Goals?: Yes Short Term Goal 6: Patient will increase left grip strength by 10# for greater abiilty to maintain grasp on a soda can when opening the can.  Long Term Goals Time to Complete Long Term Goals: 8 weeks Long Term Goal 1: Patient will return to his prior level of independence with all B/IADLs, work, and leisure activities. Long Term Goal 2: Patient will increase his left shoulder AROM to WNL for increased abiity to reach into overhead cabinets and behind his back. Long Term Goal 3: Patient will increase left shoulder strength to 5/5 for increased ability to lift yardwork tools. Long Term Goal 4: Patient will decrease pain in left shoulder to 1/10 when completing functional activities.  Long Term Goal 5: Patient will  decrease fascial restrictions to min in his left shoulder region.  Additional Long Term Goals?: Yes Long Term Goal 6: Patient will increase his left grip strength to 40 pounds or greater for increased ability to open containers.   Problem List Patient Active Problem List   Diagnosis Date Noted  . Complete rupture of rotator cuff 04/27/2013  . Pain in joint, shoulder region 04/27/2013  . Muscle weakness (generalized) 04/27/2013    End of Session Activity Tolerance: Patient tolerated treatment well General Behavior During Therapy: WFL for tasks assessed/performed OT Plan of Care OT Home Exercise Plan: towel slides and pink tputty for grip strengthening.   Consulted and Agree with Plan of Care: Patient  GO    Shirlean Mylar, OTR/L  04/27/2013, 3:18 PM  Physician Documentation Your signature is required to indicate approval of the treatment plan as stated above.  Please sign and either send electronically or make a copy of this report for your files and return this physician signed original.  Please mark one 1.__approve of plan  2. ___approve of plan with the following conditions.   ______________________________                                                          _____________________ Physician Signature  Date  

## 2013-05-01 ENCOUNTER — Ambulatory Visit (HOSPITAL_COMMUNITY)
Admission: RE | Admit: 2013-05-01 | Discharge: 2013-05-01 | Disposition: A | Discharge status: Home / Self Care | Payer: BC Managed Care – PPO | Source: Ambulatory Visit | Attending: Pulmonary Disease | Admitting: Pulmonary Disease

## 2013-05-01 DIAGNOSIS — M6281 Muscle weakness (generalized): Secondary | ICD-10-CM

## 2013-05-01 NOTE — Progress Notes (Signed)
Occupational Therapy Treatment Patient Details  Name: Jared Beck MRN: [id number] Date of Birth: [birth year]  Today's Date: 05/01/2013 Time: 4098-1191 OT Time Calculation (min): 42 min Manual 4782-9562 (18') TherEx 1308-6578 (24')  Visit#: 2 of 16  Re-eval: 05/25/13    Authorization: n/a  Authorization Time Period:    Authorization Visit#:   of    Subjective Symptoms/Limitations Symptoms: S: I feel llike I am able to use it a little bit better than I did last week.  Pain Assessment Currently in Pain?: Yes Pain Score: 5  Pain Location: Shoulder Pain Orientation: Left Pain Type: Acute pain Multiple Pain Sites: No       Exercise/Treatments Supine Protraction: PROM;AAROM;10 reps Horizontal ABduction: PROM;AAROM;10 reps External Rotation: PROM;AAROM;10 reps Internal Rotation: PROM;AAROM;10 reps Flexion: PROM;AAROM;10 reps ABduction: PROM;AAROM;10 reps Seated Elevation: AROM;10 reps Extension: AROM;10 reps Row: AROM;10 reps    Therapy Ball Flexion: 20 reps ABduction: 20 reps ROM / Strengthening / Isometric Strengthening Thumb Tacks: 1' Flexion: 3X3" (performed in supine with elbow at 90degrees ) Extension: 3X3" (performed in supine with elbow at 90degrees) External Rotation: 3X3" (performed in supine with elbow at 90degrees) Internal Rotation: 3X3" (performed in supine with elbow at 90degrees) ABduction: 3X3" (performed in supine with elbow at 90degrees) ADduction: 3X3" (performed in supine with elbow at 90degrees)     Manual Therapy Manual Therapy: Myofascial release Myofascial Release: MFR and manual stretching to left upper arm, shoulder, and scapular region to assess fascial restrictions  Occupational Therapy Assessment and Plan OT Assessment and Plan Clinical Impression Statement: A: Patient with report of increased functional use of LUE in daily tasks over weekend; continue with increase complaint of soreness following A/AAROM activities this date;  Patient reports improved discomfort in shoulder with initial isometric exercises this date.  OT Plan: P: Isometric strengthening; PROM progressing to AAROM in supine, thumbtacks, prot/ret/elev/dep.    Goals Short Term Goals Short Term Goal 1: Patient will be educated on HEP. Short Term Goal 1 Progress: Progressing toward goal Short Term Goal 2: Patient will increase left shoulder PROM to [facility] for increased ability to reach overhead into cabinets. Short Term Goal 2 Progress: Progressing toward goal Short Term Goal 3: Patient will increase left shoulder strength to 3+/5 for increased ability to lift yardwork tools. Short Term Goal 3 Progress: Progressing toward goal Short Term Goal 4: Patient will decrease pain in left shoulder to 3/10 when completing functional activities.  Short Term Goal 4 Progress: Progressing toward goal Short Term Goal 5: Patient will decrease fascial restrictions to mod in his left shoulder region.  Short Term Goal 5 Progress: Progressing toward goal Additional Short Term Goals?: Yes Short Term Goal 6: Patient will increase left grip strength by 10# for greater abiilty to maintain grasp on a soda can when opening the can.  Short Term Goal 6 Progress: Progressing toward goal Long Term Goals Long Term Goal 1: Patient will return to his prior level of independence with all B/IADLs, work, and leisure activities. Long Term Goal 1 Progress: Progressing toward goal Long Term Goal 2: Patient will increase his left shoulder AROM to WNL for increased abiity to reach into overhead cabinets and behind his back. Long Term Goal 2 Progress: Progressing toward goal Long Term Goal 3: Patient will increase left shoulder strength to 5/5 for increased ability to lift yardwork tools. Long Term Goal 3 Progress: Progressing toward goal Long Term Goal 4: Patient will decrease pain in left shoulder to 1/10 when completing functional activities.  Long  Term Goal 4 Progress: Progressing toward  goal Long Term Goal 5: Patient will decrease fascial restrictions to min in his left shoulder region.  Long Term Goal 5 Progress: Progressing toward goal Additional Long Term Goals?: Yes Long Term Goal 6: Patient will increase his left grip strength to 40 pounds or greater for increased ability to open containers.  Long Term Goal 6 Progress: Progressing toward goal  Problem List Patient Active Problem List   Diagnosis Date Noted  . Complete rupture of rotator cuff 04/27/2013  . Pain in joint, shoulder region 04/27/2013  . Muscle weakness (generalized) 04/27/2013    End of Session Activity Tolerance: Patient tolerated treatment well General Behavior During Therapy: [facility] for tasks assessed/performed  GO    Velora Mediate, OTR/L   05/01/2013, 11:46 AM

## 2013-05-03 ENCOUNTER — Ambulatory Visit (HOSPITAL_COMMUNITY)
Admission: RE | Admit: 2013-05-03 | Discharge: 2013-05-03 | Disposition: A | Discharge status: Home / Self Care | Payer: BC Managed Care – PPO | Source: Ambulatory Visit | Attending: Pulmonary Disease | Admitting: Pulmonary Disease

## 2013-05-03 DIAGNOSIS — M6281 Muscle weakness (generalized): Secondary | ICD-10-CM

## 2013-05-03 NOTE — Progress Notes (Signed)
Occupational Therapy Treatment Patient Details  Name: Jared Beck MRN: [id number] Date of Birth: 02-Apr-1961  Today's Date: 05/03/2013 Time: 4098-1191 OT Time Calculation (min): 44 min Manual 4782-9562 (20') TherExercise 1308-6578 (24')  Visit#: 3 of 16  Re-eval: 05/25/13    Authorization: n/a   Subjective Symptoms/Limitations Symptoms: S:  The shoulder is feelinig better but my elbow is feeling worse...  Pain Assessment Currently in Pain?: Yes Pain Score: 4  Pain Location: Shoulder Pain Orientation: Left Pain Type: Acute pain Multiple Pain Sites: Yes      Exercise/Treatments Supine Protraction: PROM;AAROM;10 reps Horizontal ABduction: PROM;AAROM;10 reps External Rotation: PROM;AAROM;10 reps Internal Rotation: PROM;AAROM;10 reps Flexion: PROM;AAROM;10 reps ABduction: PROM;AAROM;10 reps Seated Elevation: AROM;12 reps Extension: AROM;12 reps Row: AROM;12 reps External Rotation: AROM;12 reps Internal Rotation: AROM;10 reps   Therapy Ball Flexion: 20 reps ABduction: 20 reps               Manual Therapy Manual Therapy: Myofascial release Myofascial Release: MFR and manual stretching to left upper arm, shoulder, and scapular region to assess fascial restrictionstaken   Occupational Therapy Assessment and Plan OT Assessment and Plan Clinical Impression Statement: A: Patient with noted slight increased edema in left elbow and wrist this date.  Patient states he has gout flare-ups 3-4 times a month and he feels as though this is the difficulty.  Encouraged patient to contact physician to assist in management to prevent any further complications. Patient with improved shoulder range this date and complaint of pain ~3/10 at end of treatment with reports of improved comfort.   OT Plan: P: Isometric strengthening; prot/ret/elev/dep in sitting.    Goals Short Term Goals Short Term Goal 1: Patient will be educated on HEP. Short Term Goal 1 Progress:  Progressing toward goal Short Term Goal 2: Patient will increase left shoulder PROM to [facility] for increased ability to reach overhead into cabinets. Short Term Goal 2 Progress: Progressing toward goal Short Term Goal 3: Patient will increase left shoulder strength to 3+/5 for increased ability to lift yardwork tools. Short Term Goal 3 Progress: Progressing toward goal Short Term Goal 4: Patient will decrease pain in left shoulder to 3/10 when completing functional activities.  Short Term Goal 4 Progress: Progressing toward goal Short Term Goal 5: Patient will decrease fascial restrictions to mod in his left shoulder region.  Short Term Goal 5 Progress: Progressing toward goal Short Term Goal 6: Patient will increase left grip strength by 10# for greater abiilty to maintain grasp on a soda can when opening the can.  Short Term Goal 6 Progress: Progressing toward goal Long Term Goals Long Term Goal 1: Patient will return to his prior level of independence with all B/IADLs, work, and leisure activities. Long Term Goal 1 Progress: Progressing toward goal Long Term Goal 2: Patient will increase his left shoulder AROM to WNL for increased abiity to reach into overhead cabinets and behind his back. Long Term Goal 2 Progress: Progressing toward goal Long Term Goal 3: Patient will increase left shoulder strength to 5/5 for increased ability to lift yardwork tools. Long Term Goal 3 Progress: Progressing toward goal Long Term Goal 4: Patient will decrease pain in left shoulder to 1/10 when completing functional activities.  Long Term Goal 4 Progress: Progressing toward goal Long Term Goal 5: Patient will decrease fascial restrictions to min in his left shoulder region.  Long Term Goal 5 Progress: Progressing toward goal Long Term Goal 6: Patient will increase his left grip strength to 40  pounds or greater for increased ability to open containers.  Long Term Goal 6 Progress: Progressing toward  goal  Problem List Patient Active Problem List   Diagnosis Date Noted  . Complete rupture of rotator cuff 04/27/2013  . Pain in joint, shoulder region 04/27/2013  . Muscle weakness (generalized) 04/27/2013    End of Session Activity Tolerance: Patient tolerated treatment well General Behavior During Therapy: [facility] for tasks assessed/performed  GO    Velora Mediate, OTR/L  05/03/2013, 9:32 AM

## 2013-05-08 ENCOUNTER — Ambulatory Visit (HOSPITAL_COMMUNITY): Payer: BC Managed Care – PPO | Admitting: Occupational Therapy

## 2013-05-10 ENCOUNTER — Ambulatory Visit (HOSPITAL_COMMUNITY)
Admission: RE | Admit: 2013-05-10 | Discharge: 2013-05-10 | Disposition: A | Discharge status: Home / Self Care | Payer: BC Managed Care – PPO | Source: Ambulatory Visit | Attending: Occupational Therapy | Admitting: Occupational Therapy

## 2013-05-10 DIAGNOSIS — M6281 Muscle weakness (generalized): Secondary | ICD-10-CM

## 2013-05-10 DIAGNOSIS — M75122 Complete rotator cuff tear or rupture of left shoulder, not specified as traumatic: Secondary | ICD-10-CM

## 2013-05-10 DIAGNOSIS — M25512 Pain in left shoulder: Secondary | ICD-10-CM

## 2013-05-10 NOTE — Progress Notes (Signed)
Occupational Therapy Treatment Patient Details  Name: Jared Beck MRN: [id number] Date of Birth: 1960/12/17  Today's Date: 05/10/2013 Time: 1308-6578 OT Time Calculation (min): 44 min Manual 1302-1319 (17') TherExercise [id number]  Visit#: 4 of 16  Re-eval: 05/25/13     Subjective Symptoms/Limitations Symptoms: S: I have been exercising it at home and I believe I am doing better Pain Assessment Currently in Pain?: No/denies      Exercise/Treatments Supine Protraction: PROM;AAROM;10 reps Horizontal ABduction: PROM;AAROM;10 reps External Rotation: PROM;AAROM;10 reps Internal Rotation: PROM;AAROM;10 reps Flexion: PROM;AAROM;10 reps ABduction: PROM;AAROM;10 reps Seated Elevation: AROM;12 reps Extension: AROM;12 reps Row: AROM;12 reps External Rotation: AROM;12 reps Internal Rotation: AROM;10 reps Therapy Ball Flexion: 25 reps ABduction: 25 reps ROM / Strengthening / Isometric Strengthening Wall Wash: 1' Thumb Tacks: 1' Flexion: 3X5" Extension: 3X5" External Rotation: 3X5" Internal Rotation: 3X5" ABduction: 3X5" ADduction: 3X5"   Manual Therapy Manual Therapy: Myofascial release Myofascial Release: MFR and manual stretching to left upper arm, shoulder, and scapular region to assess fascial restrictions  Occupational Therapy Assessment and Plan OT Assessment and Plan Clinical Impression Statement: A: Added wall wash this date with fair tolerance. Reports improvement in elbow pain from last session.  Increased exercise reps with good tolerance.  Performed Isometric exercises with no complaint of pain.  0/10 pain at end of session, complaint of moderate fatigue.   OT Plan: P: AROM in supine to increase strength and functional use; prot/ret/elev/dep in sitting.    Goals Short Term Goals Short Term Goal 1: Patient will be educated on HEP. Short Term Goal 1 Progress: Progressing toward goal Short Term Goal 2: Patient will increase left shoulder PROM to [facility] for  increased ability to reach overhead into cabinets. Short Term Goal 2 Progress: Progressing toward goal Short Term Goal 3: Patient will increase left shoulder strength to 3+/5 for increased ability to lift yardwork tools. Short Term Goal 3 Progress: Progressing toward goal Short Term Goal 4: Patient will decrease pain in left shoulder to 3/10 when completing functional activities.  Short Term Goal 4 Progress: Progressing toward goal Short Term Goal 5: Patient will decrease fascial restrictions to mod in his left shoulder region.  Short Term Goal 5 Progress: Progressing toward goal Short Term Goal 6: Patient will increase left grip strength by 10# for greater abiilty to maintain grasp on a soda can when opening the can.  Short Term Goal 6 Progress: Progressing toward goal Long Term Goals Long Term Goal 1: Patient will return to his prior level of independence with all B/IADLs, work, and leisure activities. Long Term Goal 1 Progress: Progressing toward goal Long Term Goal 2: Patient will increase his left shoulder AROM to WNL for increased abiity to reach into overhead cabinets and behind his back. Long Term Goal 3: Patient will increase left shoulder strength to 5/5 for increased ability to lift yardwork tools. Long Term Goal 3 Progress: Progressing toward goal Long Term Goal 4: Patient will decrease pain in left shoulder to 1/10 when completing functional activities.  Long Term Goal 4 Progress: Progressing toward goal Long Term Goal 5: Patient will decrease fascial restrictions to min in his left shoulder region.  Long Term Goal 5 Progress: Progressing toward goal Long Term Goal 6: Patient will increase his left grip strength to 40 pounds or greater for increased ability to open containers.  Long Term Goal 6 Progress: Progressing toward goal  Problem List Patient Active Problem List   Diagnosis Date Noted  . Complete rupture of rotator cuff  04/27/2013  . Pain in joint, shoulder region  04/27/2013  . Muscle weakness (generalized) 04/27/2013    End of Session Activity Tolerance: Patient tolerated treatment well General Behavior During Therapy: [facility] for tasks assessed/performed  GO    Velora Mediate, OTR/L  05/10/2013, 1:49 PM

## 2013-05-15 ENCOUNTER — Ambulatory Visit (HOSPITAL_COMMUNITY)
Admission: RE | Admit: 2013-05-15 | Discharge: 2013-05-15 | Disposition: A | Discharge status: Home / Self Care | Payer: BC Managed Care – PPO | Source: Ambulatory Visit | Attending: Pulmonary Disease | Admitting: Pulmonary Disease

## 2013-05-16 NOTE — Progress Notes (Signed)
Occupational Therapy Treatment Patient Details  Name: Jared Beck MRN: [id number] Date of Birth: 1961-03-12  Today's Date: 05/16/2013 Time: 7829-5621 OT Time Calculation (min): 43 min Manual 1305-1323 (18') TherExercise 1323-1348 (25')  Visit#: 5 of 16  Re-eval: 05/25/13    Authorization: n/a   Subjective Symptoms/Limitations Symptoms: S: It usually hurts more the next day or two following therapy but then i know I can move it better.  Pain Assessment Currently in Pain?: Yes Pain Score: 2  Pain Location: Shoulder Pain Orientation: Left Pain Type: Acute pain Multiple Pain Sites: No     Exercise/Treatments Supine Protraction: PROM;10 reps;AAROM;15 reps Horizontal ABduction: PROM;AAROM;10 reps External Rotation: PROM;10 reps;AAROM;15 reps Internal Rotation: PROM;10 reps;AAROM;15 reps Flexion: PROM;10 reps;AAROM;15 reps ABduction: PROM;10 reps;AAROM;12 reps Seated Elevation: AROM;15 reps Extension: AROM;15 reps Row: AROM;15 reps External Rotation: AROM;15 reps Internal Rotation: AROM;15 reps Therapy Ball Flexion: 25 reps ABduction: 25 reps Right/Left: 5 reps ROM / Strengthening / Isometric Strengthening Thumb Tacks: 1' Flexion: 3X5" Extension: 3X5" External Rotation: 3X5" Internal Rotation: 3X5" ABduction: 3X5" ADduction: 3X5"            Manual Therapy Manual Therapy: Myofascial release Myofascial Release: MFR and manual stretching to left upper arm, shoulder, and scapular region to assess fascial restrictions  Occupational Therapy Assessment and Plan OT Assessment and Plan Clinical Impression Statement: A:  Increase ROM repititions in supine with good tolerance.  Initiated right/left with therapy ball with fair+ tolerance and no increased complaints.  Continue to report increasing functional use of LUE in daily tasks.   OT Plan: P: AROM in supine to increase strength and functional use; prot/ret/elev/dep in sitting.    Goals Short Term  Goals Short Term Goal 1: Patient will be educated on HEP. Short Term Goal 1 Progress: Progressing toward goal Short Term Goal 2: Patient will increase left shoulder PROM to [facility] for increased ability to reach overhead into cabinets. Short Term Goal 2 Progress: Progressing toward goal Short Term Goal 3: Patient will increase left shoulder strength to 3+/5 for increased ability to lift yardwork tools. Short Term Goal 3 Progress: Progressing toward goal Short Term Goal 4: Patient will decrease pain in left shoulder to 3/10 when completing functional activities.  Short Term Goal 4 Progress: Progressing toward goal Short Term Goal 5: Patient will decrease fascial restrictions to mod in his left shoulder region.  Short Term Goal 5 Progress: Progressing toward goal Short Term Goal 6: Patient will increase left grip strength by 10# for greater abiilty to maintain grasp on a soda can when opening the can.  Short Term Goal 6 Progress: Progressing toward goal Long Term Goals Long Term Goal 1: Patient will return to his prior level of independence with all B/IADLs, work, and leisure activities. Long Term Goal 1 Progress: Progressing toward goal Long Term Goal 2: Patient will increase his left shoulder AROM to WNL for increased abiity to reach into overhead cabinets and behind his back. Long Term Goal 2 Progress: Progressing toward goal Long Term Goal 3: Patient will increase left shoulder strength to 5/5 for increased ability to lift yardwork tools. Long Term Goal 3 Progress: Progressing toward goal Long Term Goal 4: Patient will decrease pain in left shoulder to 1/10 when completing functional activities.  Long Term Goal 4 Progress: Progressing toward goal Long Term Goal 5: Patient will decrease fascial restrictions to min in his left shoulder region.  Long Term Goal 5 Progress: Progressing toward goal Long Term Goal 6: Patient will increase his left  grip strength to 40 pounds or greater for increased  ability to open containers.  Long Term Goal 6 Progress: Progressing toward goal  Problem List Patient Active Problem List   Diagnosis Date Noted  . Complete rupture of rotator cuff 04/27/2013  . Pain in joint, shoulder region 04/27/2013  . Muscle weakness (generalized) 04/27/2013    End of Session Activity Tolerance: Patient tolerated treatment well General Behavior During Therapy: [facility] for tasks assessed/performed  GO    Velora Mediate, OTR/L  05/16/2013, 9:26 AM

## 2013-05-17 ENCOUNTER — Ambulatory Visit (HOSPITAL_COMMUNITY): Payer: BC Managed Care – PPO | Admitting: Occupational Therapy

## 2013-05-22 ENCOUNTER — Ambulatory Visit (HOSPITAL_COMMUNITY)
Admission: RE | Admit: 2013-05-22 | Discharge: 2013-05-22 | Disposition: A | Discharge status: Home / Self Care | Payer: Disability Insurance | Source: Ambulatory Visit | Attending: Pulmonary Disease | Admitting: Pulmonary Disease

## 2013-05-22 DIAGNOSIS — M25519 Pain in unspecified shoulder: Secondary | ICD-10-CM | POA: Insufficient documentation

## 2013-05-22 DIAGNOSIS — M6281 Muscle weakness (generalized): Secondary | ICD-10-CM | POA: Insufficient documentation

## 2013-05-22 DIAGNOSIS — IMO0001 Reserved for inherently not codable concepts without codable children: Secondary | ICD-10-CM | POA: Insufficient documentation

## 2013-05-23 NOTE — Progress Notes (Signed)
Occupational Therapy Treatment Patient Details  Name: Jared Beck MRN: [id number] Date of Birth: 1960/08/26  Today's Date: 05/22/2013 Time: 1191-4782 OT Time Calculation (min): 46 min Manual 1304-1324 (20') TherExercises 1324-1350 (26')   Visit#: 6 of 16  Re-eval: 05/25/13    Authorization: n/a   Subjective Symptoms/Limitations Symptoms: S: I dont know what is going on but certain ways i move my arm it feels just like someone is stabbing me Pain Assessment Currently in Pain?: Yes Pain Score: 3  Pain Location: Shoulder Pain Orientation: Left Pain Type: Acute pain Effect of Pain on Daily Activities: Patient reports increased pain over weekend of 6/10 in shoulder area where he is still having pain today  Multiple Pain Sites: No       Exercise/Treatments Supine Protraction: PROM;10 reps;AAROM;15 reps Horizontal ABduction: PROM;AAROM;10 reps External Rotation: PROM;10 reps;AAROM;15 reps Internal Rotation: PROM;10 reps;AAROM;15 reps Flexion: PROM;10 reps;AAROM;15 reps (with increased complaint of pain ) ABduction: PROM;10 reps;AAROM;12 reps Seated Elevation: AROM;15 reps Extension: AROM;15 reps Row: AROM;15 reps External Rotation: AROM;15 reps Internal Rotation: AROM;15 reps   Therapy Ball Flexion: 10 reps (increased complaint of anterior shoulder pain ) ABduction: 20 reps ROM / Strengthening / Isometric Strengthening   Flexion: 3X5" Extension: 3X5" External Rotation: 3X5" Internal Rotation: 3X5" ABduction: 3X5" ADduction: 3X5"            Manual Therapy Manual Therapy: Myofascial release Myofascial Release: MFR and manual stretching to left upper arm, shoulder, and scapular region to assess fascial restrictions Weight Bearing Technique Weight Bearing Technique: No  Occupational Therapy Assessment and Plan OT Assessment and Plan Clinical Impression Statement: A: Patient with increased complaint of pain this date.  Increased difficulty with flexion  and abduction movements this date.  Encouraged patient to contact MD for follow up appt as he does not have one scheduled at this time.  Patient reports good follow through with HEP at home since unable to attend last tretment.   OT Plan: P: Reassessment. AROM in supine to increase strength and functional use; prot/ret/elev/dep in sitting.    Goals Short Term Goals Short Term Goal 1: Patient will be educated on HEP. Short Term Goal 1 Progress: Progressing toward goal Short Term Goal 2: Patient will increase left shoulder PROM to [facility] for increased ability to reach overhead into cabinets. Short Term Goal 2 Progress: Progressing toward goal Short Term Goal 3: Patient will increase left shoulder strength to 3+/5 for increased ability to lift yardwork tools. Short Term Goal 3 Progress: Progressing toward goal Short Term Goal 4: Patient will decrease pain in left shoulder to 3/10 when completing functional activities.  Short Term Goal 4 Progress: Progressing toward goal Short Term Goal 5: Patient will decrease fascial restrictions to mod in his left shoulder region.  Short Term Goal 5 Progress: Progressing toward goal Short Term Goal 6: Patient will increase left grip strength by 10# for greater abiilty to maintain grasp on a soda can when opening the can.  Short Term Goal 6 Progress: Progressing toward goal Long Term Goals Long Term Goal 1: Patient will return to his prior level of independence with all B/IADLs, work, and leisure activities. Long Term Goal 1 Progress: Progressing toward goal Long Term Goal 2: Patient will increase his left shoulder AROM to WNL for increased abiity to reach into overhead cabinets and behind his back. Long Term Goal 2 Progress: Progressing toward goal Long Term Goal 3: Patient will increase left shoulder strength to 5/5 for increased ability to lift yardwork tools.  Long Term Goal 3 Progress: Progressing toward goal Long Term Goal 4: Patient will decrease pain in  left shoulder to 1/10 when completing functional activities.  Long Term Goal 4 Progress: Progressing toward goal Long Term Goal 5: Patient will decrease fascial restrictions to min in his left shoulder region.  Long Term Goal 5 Progress: Progressing toward goal Long Term Goal 6: Patient will increase his left grip strength to 40 pounds or greater for increased ability to open containers.  Long Term Goal 6 Progress: Progressing toward goal  Problem List Patient Active Problem List   Diagnosis Date Noted  . Complete rupture of rotator cuff 04/27/2013  . Pain in joint, shoulder region 04/27/2013  . Muscle weakness (generalized) 04/27/2013    End of Session Activity Tolerance: Patient tolerated treatment well;Patient limited by pain General Behavior During Therapy: [facility] for tasks assessed/performed   Velora Mediate, OTR/L 05/22/2013, 11:10 PM

## 2013-05-25 ENCOUNTER — Ambulatory Visit (HOSPITAL_COMMUNITY)
Admission: RE | Admit: 2013-05-25 | Discharge: 2013-05-25 | Disposition: A | Discharge status: Home / Self Care | Payer: Disability Insurance | Source: Ambulatory Visit | Attending: Pulmonary Disease | Admitting: Pulmonary Disease

## 2013-05-25 DIAGNOSIS — M75122 Complete rotator cuff tear or rupture of left shoulder, not specified as traumatic: Secondary | ICD-10-CM

## 2013-05-25 DIAGNOSIS — M25512 Pain in left shoulder: Secondary | ICD-10-CM

## 2013-05-25 DIAGNOSIS — M6281 Muscle weakness (generalized): Secondary | ICD-10-CM

## 2013-05-25 NOTE — Progress Notes (Signed)
Occupational Therapy Treatment Patient Details  Name: Jared Beck MRN: [id number] Date of Birth: [birth year]  Today's Date: 05/25/2013 Time: 1310-1340 OT Time Calculation (min): 30 min Manual therapy 9147-8295 12' Therapeutic exercises 1322-1340 18' Visit#: 7 of 16  Re-eval: 05/25/13    Subjective S:  My arm has been having a stabbing pain in the joint and down my arm.  It really hurts when I reach forward (flexion).  I called the MD and I have an appt next Friday. Limitations: PROM progressing to AAROM and AROM as tolerated Pain Assessment Currently in Pain?: Yes Pain Score: 5  Pain Location: Shoulder Pain Orientation: Left Pain Type: Acute pain  Precautions/Restrictions   progress as tolerated  Exercise/Treatments Supine Protraction: PROM;10 reps;AAROM;15 reps Horizontal ABduction: PROM;10 reps;AAROM;15 reps External Rotation: PROM;10 reps;AAROM;15 reps Internal Rotation: PROM;10 reps;AAROM;15 reps Flexion: PROM;10 reps;AAROM;15 reps;Limitations Flexion Limitations: to 70 ABduction: PROM;10 reps;AAROM;15 reps;Limitations ABduction Limitations: to 70 Seated Elevation: AROM;15 reps Extension: AROM;15 reps Row: AROM;15 reps External Rotation: AROM;15 reps Internal Rotation: AROM;15 reps Therapy Ball Flexion:  (hold due to shoulder pain) ABduction: 25 reps Right/Left:  (hold due to shoudler pain) ROM / Strengthening / Isometric Strengthening Thumb Tacks: patient declined       Manual Therapy Manual Therapy: Myofascial release Myofascial Release: MFR and manual stretching to left upper arm, shoulder, and scapular region to assess fascial restrictions  Occupational Therapy Assessment and Plan OT Assessment and Plan Clinical Impression Statement: A: treatment tolerated well, very limited with AAROM due to pain in anterior shoulder and upper arm.   Hold reassessment until next week, as patient is returning to MD at the end of next week. OT Plan: P:  Attempt AAROM  in supine with less facilitation   Goals Short Term Goals Short Term Goal 1: Patient will be educated on HEP. Short Term Goal 2: Patient will increase left shoulder PROM to [facility] for increased ability to reach overhead into cabinets. Short Term Goal 3: Patient will increase left shoulder strength to 3+/5 for increased ability to lift yardwork tools. Short Term Goal 4: Patient will decrease pain in left shoulder to 3/10 when completing functional activities.  Short Term Goal 5: Patient will decrease fascial restrictions to mod in his left shoulder region.  Short Term Goal 6: Patient will increase left grip strength by 10# for greater abiilty to maintain grasp on a soda can when opening the can.  Long Term Goals Long Term Goal 1: Patient will return to his prior level of independence with all B/IADLs, work, and leisure activities. Long Term Goal 2: Patient will increase his left shoulder AROM to WNL for increased abiity to reach into overhead cabinets and behind his back. Long Term Goal 3: Patient will increase left shoulder strength to 5/5 for increased ability to lift yardwork tools. Long Term Goal 4: Patient will decrease pain in left shoulder to 1/10 when completing functional activities.  Long Term Goal 5: Patient will decrease fascial restrictions to min in his left shoulder region.  Long Term Goal 6: Patient will increase his left grip strength to 40 pounds or greater for increased ability to open containers.   Problem List Patient Active Problem List   Diagnosis Date Noted  . Complete rupture of rotator cuff 04/27/2013  . Pain in joint, shoulder region 04/27/2013  . Muscle weakness (generalized) 04/27/2013    End of Session Activity Tolerance: Patient tolerated treatment well General Behavior During Therapy: [facility] for tasks assessed/performed  GO    Bethany H.  Dayton Scrape, OTR/L  05/25/2013, 1:44 PM

## 2013-05-29 ENCOUNTER — Ambulatory Visit (HOSPITAL_COMMUNITY)
Admission: RE | Admit: 2013-05-29 | Payer: Disability Insurance | Source: Ambulatory Visit | Attending: Pulmonary Disease | Admitting: Pulmonary Disease

## 2013-05-31 ENCOUNTER — Ambulatory Visit (HOSPITAL_COMMUNITY): Payer: BC Managed Care – PPO | Admitting: Specialist

## 2013-06-04 ENCOUNTER — Ambulatory Visit (HOSPITAL_COMMUNITY): Payer: BC Managed Care – PPO | Admitting: Specialist

## 2013-06-07 ENCOUNTER — Ambulatory Visit (HOSPITAL_COMMUNITY): Payer: BC Managed Care – PPO | Admitting: Occupational Therapy

## 2013-06-08 ENCOUNTER — Emergency Department (HOSPITAL_COMMUNITY)
Admission: EM | Admit: 2013-06-08 | Discharge: 2013-06-08 | Disposition: A | Discharge status: Home / Self Care | Payer: Disability Insurance | Attending: Emergency Medicine | Admitting: Emergency Medicine

## 2013-06-08 ENCOUNTER — Encounter (HOSPITAL_COMMUNITY): Payer: Self-pay | Admitting: Emergency Medicine

## 2013-06-08 DIAGNOSIS — IMO0002 Reserved for concepts with insufficient information to code with codable children: Secondary | ICD-10-CM | POA: Insufficient documentation

## 2013-06-08 DIAGNOSIS — M109 Gout, unspecified: Secondary | ICD-10-CM

## 2013-06-08 DIAGNOSIS — Z880 Allergy status to penicillin: Secondary | ICD-10-CM | POA: Insufficient documentation

## 2013-06-08 MED ORDER — OXYCODONE-ACETAMINOPHEN 5-325 MG PO TABS: 2.0000 | ORAL_TABLET | ORAL | Status: DC | PRN

## 2013-06-08 MED ORDER — PREDNISONE 10 MG PO TABS: ORAL_TABLET | ORAL | Status: DC

## 2013-06-08 MED ORDER — PREDNISONE 50 MG PO TABS
60.0000 mg | ORAL_TABLET | Freq: Once | ORAL | Status: AC
  Administered 2013-06-08: 60 mg via ORAL
  Filled 2013-06-08 (×2): qty 1

## 2013-06-08 NOTE — ED Notes (Signed)
Pt c/o left foot pain. Pain mostly in the left big toe and 2nd toe. Pt c/o warmth, redness, and swelling to left foot and toes.

## 2013-06-11 ENCOUNTER — Ambulatory Visit (HOSPITAL_COMMUNITY): Payer: BC Managed Care – PPO | Admitting: Occupational Therapy

## 2013-06-11 MED FILL — Oxycodone w/ Acetaminophen Tab 5-325 MG: ORAL | Qty: 6 | Status: AC

## 2013-06-11 NOTE — ED Provider Notes (Signed)
CSN: [id number]     Arrival date & time 06/08/13  2131 History   First MD Initiated Contact with Patient 06/08/13 2155     Chief Complaint  Patient presents with  . Foot Pain  . Foot Swelling   (Consider location/radiation/quality/duration/timing/severity/associated sxs/prior Treatment) Patient is a 52 y.o. male presenting with lower extremity pain. The history is provided by the patient.  Foot Pain This is a recurrent (Patient has a history of gout with todays symptoms similar and in the same location of prior gouty attacks.  He denies injury) problem. The current episode started yesterday. The problem occurs constantly. The problem has been gradually worsening. Associated symptoms include arthralgias and joint swelling. Pertinent negatives include no chills, diaphoresis, fever, myalgias, numbness, sore throat or weakness. The symptoms are aggravated by walking (bending and palpation). He has tried rest and NSAIDs for the symptoms. The treatment provided no relief.    Past Medical History  Diagnosis Date  . Arthritis   . Gout   . Gout    Past Surgical History  Procedure Laterality Date  . Cholecystectomy    . Appendectomy    . Hernia repair    . Rotator cuff surgery     History reviewed. No pertinent family history. History  Substance Use Topics  . Smoking status: Never Smoker   . Smokeless tobacco: Not on file  . Alcohol Use: No    Review of Systems  Constitutional: Negative for fever, chills and diaphoresis.  HENT: Negative for sore throat.   Musculoskeletal: Positive for arthralgias and joint swelling. Negative for myalgias.  Skin: Positive for color change.  Neurological: Negative for weakness and numbness.    Allergies  Penicillins  Home Medications   Current Outpatient Rx  Name  Route  Sig  Dispense  Refill  . naproxen sodium (ALEVE) 220 MG tablet   Oral   Take 220 mg by mouth 2 (two) times daily as needed. For pain         . oxyCODONE-acetaminophen  (PERCOCET/ROXICET) 5-325 MG per tablet   Oral   Take 2 tablets by mouth every 4 (four) hours as needed for severe pain.   6 tablet   0   . predniSONE (DELTASONE) 10 MG tablet      6, 5, 4, 3, 2 then 1 tablet by mouth daily for 6 days total.   21 tablet   0    BP 114/75  Pulse 74  Temp(Src) 97.9 F (36.6 C) (Oral)  Resp 18  Ht 5\' 8"  (1.727 m)  Wt 173 lb (78.472 kg)  BMI 26.31 kg/m2  SpO2 100% Physical Exam  Constitutional: He appears well-developed and well-nourished.  HENT:  Head: Atraumatic.  Neck: Normal range of motion.  Cardiovascular:  Pulses equal bilaterally  Musculoskeletal: He exhibits edema and tenderness.       Feet:  Erythema, edema and ttp with even gentle touch to skin.  No red streaking,  Skin intact.  Neurological: He is alert. He has normal strength. He displays normal reflexes. No sensory deficit.  Equal strength  Skin: Skin is warm and dry. There is erythema.  No punctures, abrasions,  Skin intact   Psychiatric: He has a normal mood and affect.    ED Course  Procedures (including critical care time) Labs Review Labs Reviewed - No data to display Imaging Review No results found.  EKG Interpretation   None       MDM   1. Gout attack    Pt  was prescribed oxycodone and prednisone,  Encouraged elevation, heat tx.  First dose of prednisone given here.  He was advised to f/u with pcp prn.  No risk for infection, no injury to site.  Sx c/w prior gouty flairs.    Burgess Amor, PA-C 06/11/13 1310

## 2013-06-13 ENCOUNTER — Ambulatory Visit (HOSPITAL_COMMUNITY): Payer: BC Managed Care – PPO | Admitting: Specialist

## 2013-06-13 NOTE — ED Provider Notes (Signed)
Medical screening examination/treatment/procedure(s) were performed by non-physician practitioner and as supervising physician I was immediately available for consultation/collaboration.  EKG Interpretation   None       Devoria Albe, MD, Armando Gang   Ward Givens, MD 06/13/13 1501

## 2013-06-15 ENCOUNTER — Emergency Department (HOSPITAL_COMMUNITY): Payer: Disability Insurance

## 2013-06-15 ENCOUNTER — Emergency Department (HOSPITAL_COMMUNITY)
Admission: EM | Admit: 2013-06-15 | Discharge: 2013-06-15 | Disposition: A | Discharge status: Home / Self Care | Payer: Disability Insurance | Attending: Emergency Medicine | Admitting: Emergency Medicine

## 2013-06-15 ENCOUNTER — Encounter (HOSPITAL_COMMUNITY): Payer: Self-pay | Admitting: Emergency Medicine

## 2013-06-15 DIAGNOSIS — Z8639 Personal history of other endocrine, nutritional and metabolic disease: Secondary | ICD-10-CM | POA: Insufficient documentation

## 2013-06-15 DIAGNOSIS — Z862 Personal history of diseases of the blood and blood-forming organs and certain disorders involving the immune mechanism: Secondary | ICD-10-CM | POA: Insufficient documentation

## 2013-06-15 DIAGNOSIS — IMO0002 Reserved for concepts with insufficient information to code with codable children: Secondary | ICD-10-CM | POA: Insufficient documentation

## 2013-06-15 DIAGNOSIS — M25579 Pain in unspecified ankle and joints of unspecified foot: Secondary | ICD-10-CM | POA: Insufficient documentation

## 2013-06-15 DIAGNOSIS — Z880 Allergy status to penicillin: Secondary | ICD-10-CM | POA: Insufficient documentation

## 2013-06-15 DIAGNOSIS — M79671 Pain in right foot: Secondary | ICD-10-CM

## 2013-06-15 DIAGNOSIS — M069 Rheumatoid arthritis, unspecified: Secondary | ICD-10-CM | POA: Insufficient documentation

## 2013-06-15 MED ORDER — KETOROLAC TROMETHAMINE 60 MG/2ML IM SOLN
60.0000 mg | Freq: Once | INTRAMUSCULAR | Status: AC
  Administered 2013-06-15: 60 mg via INTRAMUSCULAR
  Filled 2013-06-15: qty 2

## 2013-06-15 MED ORDER — PREDNISONE 20 MG PO TABS: 60.0000 mg | ORAL_TABLET | Freq: Every day | ORAL | Status: DC

## 2013-06-15 MED ORDER — TRAMADOL HCL 50 MG PO TABS: 50.0000 mg | ORAL_TABLET | Freq: Four times a day (QID) | ORAL | Status: DC | PRN

## 2013-06-15 NOTE — ED Provider Notes (Signed)
CSN: [id number]     Arrival date & time 06/15/13  0409 History   First MD Initiated Contact with Patient 06/15/13 0414     Chief Complaint  Patient presents with  . Foot Pain   (Consider location/radiation/quality/duration/timing/severity/associated sxs/prior Treatment) HPI History per patient. Recently evaluated here for gout in his left great toe. For the last 2 weeks has been working with the [employer] on his feet all day long. Tonight he presents with bilateral foot pain and swelling, worse with ambulating and tonight unable to sleep due to discomfort. No redness or swelling. States this feels different than his gout. He does have a history of rheumatoid arthritis denies history of pain in his feet like this. No fevers. No vomiting. Pain sharp in quality. No fall or trauma otherwise. Past Medical History  Diagnosis Date  . Arthritis   . Gout   . Gout    Past Surgical History  Procedure Laterality Date  . Cholecystectomy    . Appendectomy    . Hernia repair    . Rotator cuff surgery     No family history on file. History  Substance Use Topics  . Smoking status: Never Smoker   . Smokeless tobacco: Not on file  . Alcohol Use: No    Review of Systems  Constitutional: Negative for fever and chills.  Eyes: Negative for visual disturbance.  Respiratory: Negative for shortness of breath.   Cardiovascular: Negative for leg swelling.  Gastrointestinal: Negative for abdominal pain.  Genitourinary: Negative for flank pain.  Musculoskeletal: Negative for back pain, neck pain and neck stiffness.  Skin: Negative for rash.  Neurological: Negative for weakness and numbness.  All other systems reviewed and are negative.    Allergies  Penicillins  Home Medications   Current Outpatient Rx  Name  Route  Sig  Dispense  Refill  . naproxen sodium (ALEVE) 220 MG tablet   Oral   Take 220 mg by mouth 2 (two) times daily as needed. For pain         . oxyCODONE-acetaminophen  (PERCOCET/ROXICET) 5-325 MG per tablet   Oral   Take 2 tablets by mouth every 4 (four) hours as needed for severe pain.   6 tablet   0   . predniSONE (DELTASONE) 10 MG tablet      6, 5, 4, 3, 2 then 1 tablet by mouth daily for 6 days total.   21 tablet   0   . predniSONE (DELTASONE) 20 MG tablet   Oral   Take 3 tablets (60 mg total) by mouth daily.   15 tablet   0   . traMADol (ULTRAM) 50 MG tablet   Oral   Take 1 tablet (50 mg total) by mouth every 6 (six) hours as needed.   15 tablet   0    BP 148/88  Pulse 77  Temp(Src) 97.6 F (36.4 C) (Oral)  Resp 18  Ht 5\' 8"  (1.727 m)  Wt 173 lb (78.472 kg)  BMI 26.31 kg/m2  SpO2 100% Physical Exam  Constitutional: He is oriented to person, place, and time. He appears well-developed and well-nourished.  HENT:  Head: Normocephalic and atraumatic.  Eyes: EOM are normal. Pupils are equal, round, and reactive to light.  Neck: Neck supple.  Cardiovascular: Normal rate, regular rhythm and intact distal pulses.   Pulmonary/Chest: Effort normal and breath sounds normal. No respiratory distress.  Abdominal: Soft. There is no tenderness.  Musculoskeletal: Normal range of motion.  Bilateral foot  tenderness and swelling over the dorsum. No erythema. No increased test. No tenderness over include. No calf tenderness. Distal neurovascular intact. Skin intact throughout.  Neurological: He is alert and oriented to person, place, and time.  Skin: Skin is warm and dry.    ED Course  Procedures (including critical care time) Labs Review Labs Reviewed - No data to display Imaging Review Dg Foot Complete Left  06/15/2013   CLINICAL DATA:  Bilateral foot pain for 2-3 weeks. History of rheumatoid arthritis and gout.  EXAM: LEFT FOOT - COMPLETE 3+ VIEW  COMPARISON:  Left ankle 04/14/2012  FINDINGS: Prominent degenerative changes with joint space loss and sclerosis throughout the intertarsal joints and 1st through 3rd tarsometatarsal joints.  Probable hammertoe deformities. No evidence of acute fracture or subluxation. Vascular calcifications.  IMPRESSION: Prominent degenerative changes throughout the intertarsal joints. No acute fractures.   Electronically Signed   By: Burman Nieves M.D.   On: 06/15/2013 05:06   Dg Foot Complete Right  06/15/2013   CLINICAL DATA:  Bilateral foot pain for 2-3 weeks. History of rheumatoid arthritis and gout. With right 5th toe has been sitting on top of the right 4th toe for 1 year.  EXAM: RIGHT FOOT COMPLETE - 3+ VIEW  COMPARISON:  None.  FINDINGS: Hammertoe deformities and extension contraction of the 5th toe. Prominent degenerative changes with sclerosis, productive osteophytic changes, and bone loss involving the tarsal bones of the midfoot. Cystic changes are demonstrated in the tarsal bones. Changes suggest Charcot joint. Vascular calcifications. No acute fracture.  IMPRESSION: Prominent arthritic changes in the intertarsal joints consistent with Charcot joint. Hammertoe deformities. No acute fracture.   Electronically Signed   By: Burman Nieves M.D.   On: 06/15/2013 05:04   Toradol provided  X-rays reviewed as above. Plan discharge home with prescription for medications as needed. Work note x2 days. Patient advised to stay off his feet keep them elevated when not working. He will followup with his doctor as needed.  Return precautions provided.      MDM   1. Bilateral foot pain    History of arthritis and recently spending a lot of time on his feet. X-rays as above consistent with recurrent No clinical gout Vital signs and nursing notes reviewed and considered   Sunnie Nielsen, MD 06/15/13 [phone]

## 2013-06-15 NOTE — ED Notes (Signed)
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  Patient states he is on his feet 10 hours a day and the pain is getting worse.

## 2013-07-18 ENCOUNTER — Emergency Department (HOSPITAL_COMMUNITY)
Admission: EM | Admit: 2013-07-18 | Discharge: 2013-07-18 | Disposition: A | Discharge status: Home / Self Care | Payer: Disability Insurance | Attending: Emergency Medicine | Admitting: Emergency Medicine

## 2013-07-18 ENCOUNTER — Emergency Department (HOSPITAL_COMMUNITY): Payer: Disability Insurance

## 2013-07-18 ENCOUNTER — Encounter (HOSPITAL_COMMUNITY): Payer: Self-pay | Admitting: Emergency Medicine

## 2013-07-18 DIAGNOSIS — Z79899 Other long term (current) drug therapy: Secondary | ICD-10-CM | POA: Insufficient documentation

## 2013-07-18 DIAGNOSIS — R509 Fever, unspecified: Secondary | ICD-10-CM | POA: Insufficient documentation

## 2013-07-18 DIAGNOSIS — R197 Diarrhea, unspecified: Secondary | ICD-10-CM | POA: Insufficient documentation

## 2013-07-18 DIAGNOSIS — R059 Cough, unspecified: Secondary | ICD-10-CM | POA: Insufficient documentation

## 2013-07-18 DIAGNOSIS — R05 Cough: Secondary | ICD-10-CM

## 2013-07-18 DIAGNOSIS — Z8739 Personal history of other diseases of the musculoskeletal system and connective tissue: Secondary | ICD-10-CM | POA: Insufficient documentation

## 2013-07-18 DIAGNOSIS — R52 Pain, unspecified: Secondary | ICD-10-CM

## 2013-07-18 DIAGNOSIS — R110 Nausea: Secondary | ICD-10-CM | POA: Insufficient documentation

## 2013-07-18 DIAGNOSIS — M109 Gout, unspecified: Secondary | ICD-10-CM | POA: Insufficient documentation

## 2013-07-18 DIAGNOSIS — R6889 Other general symptoms and signs: Secondary | ICD-10-CM

## 2013-07-18 DIAGNOSIS — Z880 Allergy status to penicillin: Secondary | ICD-10-CM | POA: Insufficient documentation

## 2013-07-18 LAB — URINALYSIS, ROUTINE W REFLEX MICROSCOPIC
Glucose, UA: NEGATIVE mg/dL
Ketones, ur: 40 mg/dL — AB
Leukocytes, UA: NEGATIVE
Protein, ur: NEGATIVE mg/dL
pH: 6 (ref 5.0–8.0)

## 2013-07-18 MED ORDER — ACETAMINOPHEN 325 MG PO TABS
650.0000 mg | ORAL_TABLET | Freq: Once | ORAL | Status: AC
  Administered 2013-07-18: 650 mg via ORAL
  Filled 2013-07-18: qty 2

## 2013-07-18 MED ORDER — IBUPROFEN 400 MG PO TABS
600.0000 mg | ORAL_TABLET | Freq: Once | ORAL | Status: AC
  Administered 2013-07-18: 600 mg via ORAL
  Filled 2013-07-18: qty 2

## 2013-07-18 NOTE — ED Provider Notes (Signed)
CSN: [id number]     Arrival date & time 07/18/13  1210 History   This chart was scribed for Enid Skeens, MD, by Yevette Edwards, ED Scribe. This patient was seen in room APA03/APA03 and the patient's care was started at 1:39 PM. First MD Initiated Contact with Patient 07/18/13 1254     Chief Complaint  Patient presents with  . Back Pain  . Diarrhea  . Cough   The history is provided by the patient. No language interpreter was used.   HPI Comments: Jared Beck is a 52 y.o. Male, with a h/o gout and arthritis, who presents to the Emergency Department complaining of an intermittent fever which began four days ago. In the ED his temperature is 97.8 F. As associated symptoms, the pt has also experienced a cough productive of black phlegm, nausea, diarrhea, chills, and myalgia including back pain, neck pain, and headaches. He denies emesis. He reports sick contacts including his girlfriend. He does not use prednisone except when he has a flare-up of gout. He denies any recent travels. The pt is a non-smoker.   Past Medical History  Diagnosis Date  . Arthritis   . Gout   . Gout    Past Surgical History  Procedure Laterality Date  . Cholecystectomy    . Appendectomy    . Hernia repair    . Rotator cuff surgery     No family history on file. History  Substance Use Topics  . Smoking status: Never Smoker   . Smokeless tobacco: Not on file  . Alcohol Use: No    Review of Systems  Constitutional: Positive for fever and chills.  Eyes: Negative for visual disturbance.  Respiratory: Positive for cough.   Gastrointestinal: Positive for nausea and diarrhea. Negative for vomiting and abdominal pain.  Musculoskeletal: Positive for back pain, myalgias and neck pain.  Skin: Negative for rash.  Neurological: Positive for headaches.  All other systems reviewed and are negative.   Allergies  Penicillins  Home Medications   Current Outpatient Rx  Name  Route  Sig  Dispense  Refill   . dextromethorphan (DELSYM) 30 MG/5ML liquid   Oral   Take 60 mg by mouth as needed for cough.         . naproxen sodium (ALEVE) 220 MG tablet   Oral   Take 220 mg by mouth 2 (two) times daily as needed. For pain          Triage Vitals: BP 113/74  Pulse 95  Temp(Src) 97.8 F (36.6 C) (Oral)  Resp 20  Ht 5\' 8"  (1.727 m)  Wt 173 lb (78.472 kg)  BMI 26.31 kg/m2  SpO2 100%  Physical Exam  Nursing note and vitals reviewed. Constitutional: He is oriented to person, place, and time. He appears well-developed and well-nourished. No distress.  HENT:  Head: Normocephalic and atraumatic.  Mild dry mucous membranes.  No exudate.  No posterior swelling.   Eyes: Conjunctivae and EOM are normal. Pupils are equal, round, and reactive to light.  Neck: Normal range of motion. Neck supple. No tracheal deviation present.  No mengismus.   Cardiovascular: Normal rate, regular rhythm and normal heart sounds.   Pulmonary/Chest: Effort normal and breath sounds normal. No respiratory distress. He has no rales.  No retractions.   Abdominal: Soft. He exhibits no distension. There is no tenderness.  Musculoskeletal: Normal range of motion. He exhibits tenderness.  Mild tenderness paraspinal,  no midline vertebral tenderness.    Lymphadenopathy:  He has no cervical adenopathy.  Neurological: He is alert and oriented to person, place, and time. No cranial nerve deficit.  Normal strength at major joints with F/E. Sensation intact.   Skin: Skin is warm and dry. No rash noted.  No petechiae. No purpura. Mild dryness to legs.   Psychiatric: He has a normal mood and affect. His behavior is normal.    ED Course  Procedures (including critical care time)  DIAGNOSTIC STUDIES: Oxygen Saturation is 1002% on room air, normal by my interpretation.    COORDINATION OF CARE:  1:45 PM- Discussed treatment plan with patient, and the patient agreed to the plan.   Labs Review Labs Reviewed   URINALYSIS, ROUTINE W REFLEX MICROSCOPIC - Abnormal; Notable for the following:    Specific Gravity, Urine >1.030 (*)    Bilirubin Urine MODERATE (*)    Ketones, ur 40 (*)    All other components within normal limits   Imaging Review Dg Chest 2 View  07/18/2013   CLINICAL DATA:  Cough and fever.  EXAM: CHEST  2 VIEW  COMPARISON:  06/17/2012  FINDINGS: The heart size and mediastinal contours are within normal limits. Both lungs are clear. Mild thoracic spine degenerative changes noted.  IMPRESSION: No active cardiopulmonary disease.   Electronically Signed   By: Myles Rosenthal M.D.   On: 07/18/2013 13:16    EKG Interpretation   None       MDM   1. Body aches   2. Cough   3. Flu-like symptoms    I personally performed the services described in this documentation, which was scribed in my presence. The recorded information has been reviewed and is accurate.  Well appearing, no risk factors for immunosuppression, flu like illness, no meningismus. No midline back pain, more body aches.   Tylenol, motrin, po fluids.   UA mild dehydration.  CXR no acute process, reviewed.  Results and differential diagnosis were discussed with the patient. Close follow up outpatient was discussed, patient comfortable with the plan.   Diagnosis: above  Enid Skeens, MD 07/18/13 1420

## 2013-07-18 NOTE — ED Notes (Signed)
Pt c/o intermittent fever, back, pain, cough, and diarrhea since Sunday.  Denies vomiting.

## 2013-07-27 ENCOUNTER — Emergency Department (HOSPITAL_COMMUNITY)
Admission: EM | Admit: 2013-07-27 | Discharge: 2013-07-27 | Disposition: A | Discharge status: Home / Self Care | Payer: Disability Insurance | Attending: Emergency Medicine | Admitting: Emergency Medicine

## 2013-07-27 ENCOUNTER — Encounter (HOSPITAL_COMMUNITY): Payer: Self-pay | Admitting: Emergency Medicine

## 2013-07-27 DIAGNOSIS — M2040 Other hammer toe(s) (acquired), unspecified foot: Secondary | ICD-10-CM | POA: Insufficient documentation

## 2013-07-27 DIAGNOSIS — M109 Gout, unspecified: Secondary | ICD-10-CM | POA: Insufficient documentation

## 2013-07-27 DIAGNOSIS — Z880 Allergy status to penicillin: Secondary | ICD-10-CM | POA: Insufficient documentation

## 2013-07-27 DIAGNOSIS — M79671 Pain in right foot: Secondary | ICD-10-CM

## 2013-07-27 MED ORDER — PREDNISONE 5 MG PO TABS: 40.0000 mg | ORAL_TABLET | Freq: Every day | ORAL | Status: DC

## 2013-07-27 MED ORDER — ACETAMINOPHEN 325 MG PO TABS
650.0000 mg | ORAL_TABLET | Freq: Once | ORAL | Status: AC
  Administered 2013-07-27: 650 mg via ORAL
  Filled 2013-07-27: qty 2

## 2013-07-27 NOTE — Discharge Instructions (Signed)
Arthritis, Nonspecific Arthritis is inflammation of a joint. This usually means pain, redness, warmth or swelling are present. One or more joints may be involved. There are a number of types of arthritis. Your caregiver may not be able to tell what type of arthritis you have right away. CAUSES  The most common cause of arthritis is the wear and tear on the joint (osteoarthritis). This causes damage to the cartilage, which can break down over time. The knees, hips, back and neck are most often affected by this type of arthritis. Other types of arthritis and common causes of joint pain include:  Sprains and other injuries near the joint. Sometimes minor sprains and injuries cause pain and swelling that develop hours later.  Rheumatoid arthritis. This affects hands, feet and knees. It usually affects both sides of your body at the same time. It is often associated with chronic ailments, fever, weight loss and general weakness.  Crystal arthritis. Gout and pseudo gout can cause occasional acute severe pain, redness and swelling in the foot, ankle, or knee.  Infectious arthritis. Bacteria can get into a joint through a break in overlying skin. This can cause infection of the joint. Bacteria and viruses can also spread through the blood and affect your joints.  Drug, infectious and allergy reactions. Sometimes joints can become mildly painful and slightly swollen with these types of illnesses. SYMPTOMS   Pain is the main symptom.  Your joint or joints can also be red, swollen and warm or hot to the touch.  You may have a fever with certain types of arthritis, or even feel overall ill.  The joint with arthritis will hurt with movement. Stiffness is present with some types of arthritis. DIAGNOSIS  Your caregiver will suspect arthritis based on your description of your symptoms and on your exam. Testing may be needed to find the type of arthritis:  Blood and sometimes urine tests.  X-ray tests  and sometimes CT or MRI scans.  Removal of fluid from the joint (arthrocentesis) is done to check for bacteria, crystals or other causes. Your caregiver (or a specialist) will numb the area over the joint with a local anesthetic, and use a needle to remove joint fluid for examination. This procedure is only minimally uncomfortable.  Even with these tests, your caregiver may not be able to tell what kind of arthritis you have. Consultation with a specialist (rheumatologist) may be helpful. TREATMENT  Your caregiver will discuss with you treatment specific to your type of arthritis. If the specific type cannot be determined, then the following general recommendations may apply. Treatment of severe joint pain includes:  Rest.  Elevation.  Anti-inflammatory medication (for example, ibuprofen) may be prescribed. Avoiding activities that cause increased pain.  Only take over-the-counter or prescription medicines for pain and discomfort as recommended by your caregiver.  Cold packs over an inflamed joint may be used for 10 to 15 minutes every hour. Hot packs sometimes feel better, but do not use overnight. Do not use hot packs if you are diabetic without your caregiver's permission.  A cortisone shot into arthritic joints may help reduce pain and swelling.  Any acute arthritis that gets worse over the next 1 to 2 days needs to be looked at to be sure there is no joint infection. Long-term arthritis treatment involves modifying activities and lifestyle to reduce joint stress jarring. This can include weight loss. Also, exercise is needed to nourish the joint cartilage and remove waste. This helps keep the muscles   around the joint strong. HOME CARE INSTRUCTIONS   Do not take aspirin to relieve pain if gout is suspected. This elevates uric acid levels.  Only take over-the-counter or prescription medicines for pain, discomfort or fever as directed by your caregiver.  Rest the joint as much as  possible.  If your joint is swollen, keep it elevated.  Use crutches if the painful joint is in your leg.  Drinking plenty of fluids may help for certain types of arthritis.  Follow your caregiver's dietary instructions.  Try low-impact exercise such as:  Swimming.  Water aerobics.  Biking.  Walking.  Morning stiffness is often relieved by a warm shower.  Put your joints through regular range-of-motion. SEEK MEDICAL CARE IF:   You do not feel better in 24 hours or are getting worse.  You have side effects to medications, or are not getting better with treatment. SEEK IMMEDIATE MEDICAL CARE IF:   You have a fever.  You develop severe joint pain, swelling or redness.  Many joints are involved and become painful and swollen.  There is severe back pain and/or leg weakness.  You have loss of bowel or bladder control. Document Released: 08/12/2004 Document Revised: 09/27/2011 Document Reviewed: 08/28/2008 Exit[employer] Patient Information 2014 [employer]. Gout Gout is an inflammatory arthritis caused by a buildup of uric acid crystals in the joints. Uric acid is a chemical that is normally present in the blood. When the level of uric acid in the blood is too high it can form crystals that deposit in your joints and tissues. This causes joint redness, soreness, and swelling (inflammation). Repeat attacks are common. Over time, uric acid crystals can form into masses (tophi) near a joint, destroying bone and causing disfigurement. Gout is treatable and often preventable. CAUSES  The disease begins with elevated levels of uric acid in the blood. Uric acid is produced by your body when it breaks down a naturally found substance called purines. Certain foods you eat, such as meats and fish, contain high amounts of purines. Causes of an elevated uric acid level include:  Being passed down from parent to child (heredity).  Diseases that cause increased uric acid production (such as  obesity, psoriasis, and certain cancers).  Excessive alcohol use.  Diet, especially diets rich in meat and seafood.  Medicines, including certain cancer-fighting medicines (chemotherapy), water pills (diuretics), and aspirin.  Chronic kidney disease. The kidneys are no longer able to remove uric acid well.  Problems with metabolism. Conditions strongly associated with gout include:  Obesity.  High blood pressure.  High cholesterol.  Diabetes. Not everyone with elevated uric acid levels gets gout. It is not understood why some people get gout and others do not. Surgery, joint injury, and eating too much of certain foods are some of the factors that can lead to gout attacks. SYMPTOMS   An attack of gout comes on quickly. It causes intense pain with redness, swelling, and warmth in a joint.  Fever can occur.  Often, only one joint is involved. Certain joints are more commonly involved:  Base of the big toe.  Knee.  Ankle.  Wrist.  Finger. Without treatment, an attack usually goes away in a few days to weeks. Between attacks, you usually will not have symptoms, which is different from many other forms of arthritis. DIAGNOSIS  Your caregiver will suspect gout based on your symptoms and exam. In some cases, tests may be recommended. The tests may include:  Blood tests.  Urine tests.    X-rays.  Joint fluid exam. This exam requires a needle to remove fluid from the joint (arthrocentesis). Using a microscope, gout is confirmed when uric acid crystals are seen in the joint fluid. TREATMENT  There are two phases to gout treatment: treating the sudden onset (acute) attack and preventing attacks (prophylaxis).  Treatment of an Acute Attack.  Medicines are used. These include anti-inflammatory medicines or steroid medicines.  An injection of steroid medicine into the affected joint is sometimes necessary.  The painful joint is rested. Movement can worsen the  arthritis.  You may use warm or cold treatments on painful joints, depending which works best for you.  Treatment to Prevent Attacks.  If you suffer from frequent gout attacks, your caregiver may advise preventive medicine. These medicines are started after the acute attack subsides. These medicines either help your kidneys eliminate uric acid from your body or decrease your uric acid production. You may need to stay on these medicines for a very long time.  The early phase of treatment with preventive medicine can be associated with an increase in acute gout attacks. For this reason, during the first few months of treatment, your caregiver may also advise you to take medicines usually used for acute gout treatment. Be sure you understand your caregiver's directions. Your caregiver may make several adjustments to your medicine dose before these medicines are effective.  Discuss dietary treatment with your caregiver or dietitian. Alcohol and drinks high in sugar and fructose and foods such as meat, poultry, and seafood can increase uric acid levels. Your caregiver or dietician can advise you on drinks and foods that should be limited. HOME CARE INSTRUCTIONS   Do not take aspirin to relieve pain. This raises uric acid levels.  Only take over-the-counter or prescription medicines for pain, discomfort, or fever as directed by your caregiver.  Rest the joint as much as possible. When in bed, keep sheets and blankets off painful areas.  Keep the affected joint raised (elevated).  Apply warm or cold treatments to painful joints. Use of warm or cold treatments depends on which works best for you.  Use crutches if the painful joint is in your leg.  Drink enough fluids to keep your urine clear or pale yellow. This helps your body get rid of uric acid. Limit alcohol, sugary drinks, and fructose drinks.  Follow your dietary instructions. Pay careful attention to the amount of protein you eat. Your  daily diet should emphasize fruits, vegetables, whole grains, and fat-free or low-fat milk products. Discuss the use of coffee, vitamin C, and cherries with your caregiver or dietician. These may be helpful in lowering uric acid levels.  Maintain a healthy body weight. SEEK MEDICAL CARE IF:   You develop diarrhea, vomiting, or any side effects from medicines.  You do not feel better in 24 hours, or you are getting worse. SEEK IMMEDIATE MEDICAL CARE IF:   Your joint becomes suddenly more tender, and you have chills or a fever. MAKE SURE YOU:   Understand these instructions.  Will watch your condition.  Will get help right away if you are not doing well or get worse. Document Released: 07/02/2000 Document Revised: 10/30/2012 Document Reviewed: 02/16/2012 [employer] Patient Information 2014 [employer].  

## 2013-07-27 NOTE — ED Provider Notes (Signed)
CSN: [id number]     Arrival date & time 07/27/13  1726 History   First MD Initiated Contact with Patient 07/27/13 1745     Chief Complaint  Patient presents with  . Foot Pain   (Consider location/radiation/quality/duration/timing/severity/associated sxs/prior Treatment) HPI  Patient with past medical history of gout and arthritis presents to the emergency department with complaints of right foot pain for the past couple of days. He reports that he has had been getting gout since he was in his 73s. He states he used to take all to seen but they no longer have a generic version therefore he cannot take it anymore. He's been taking ibuprofen for pain but has not helped. He states that prednisone normally helps for his pain. Denies there being any redness to the joints or having any fevers, nausea, vomiting, diarrhea, chills or weakness.  Past Medical History  Diagnosis Date  . Arthritis   . Gout   . Gout    Past Surgical History  Procedure Laterality Date  . Cholecystectomy    . Appendectomy    . Hernia repair    . Rotator cuff surgery     Family History  Problem Relation Age of Onset  . Cancer Mother   . Cancer Father    History  Substance Use Topics  . Smoking status: Never Smoker   . Smokeless tobacco: Not on file  . Alcohol Use: No    Review of Systems The patient denies anorexia, fever, weight loss,, vision loss, decreased hearing, hoarseness, chest pain, syncope, dyspnea on exertion, peripheral edema, balance deficits, hemoptysis, abdominal pain, melena, hematochezia, severe indigestion/heartburn, hematuria, incontinence, genital sores, muscle weakness, suspicious skin lesions, transient blindness, difficulty walking, depression, unusual weight change, abnormal bleeding, enlarged lymph nodes, angioedema, and breast masses.   Allergies  Penicillins  Home Medications   Current Outpatient Rx  Name  Route  Sig  Dispense  Refill  . naproxen sodium (ALEVE) 220 MG tablet    Oral   Take 220 mg by mouth 2 (two) times daily as needed. For pain         . predniSONE (DELTASONE) 5 MG tablet   Oral   Take 8 tablets (40 mg total) by mouth daily.   40 tablet   0    BP 111/75  Pulse 88  Temp(Src) 98.2 F (36.8 C) (Oral)  Resp 16  Ht 5\' 8"  (1.727 m)  Wt 170 lb (77.111 kg)  BMI 25.85 kg/m2  SpO2 99% Physical Exam  Nursing note and vitals reviewed. Constitutional: He appears well-developed and well-nourished. No distress.  HENT:  Head: Normocephalic and atraumatic.  Eyes: Pupils are equal, round, and reactive to light.  Neck: Normal range of motion. Neck supple.  Cardiovascular: Normal rate and regular rhythm.   Pulmonary/Chest: Effort normal.  Abdominal: Soft.  Musculoskeletal:  Hammertoe deformity of right pinky toe Tenderness to palpation of dorsum of foot No cellulitis or joint swelling pedal pulses are symmetrical.  Neurological: He is alert.  Skin: Skin is warm and dry.    ED Course  Procedures (including critical care time) Labs Review Labs Reviewed - No data to display Imaging Review No results found.  EKG Interpretation   None       MDM   1. Foot pain, right    He shouldn't with symptoms consistent of arthritis versus gout. He is a nondiabetic therefore will start on prednisone. Colchicine is too expensive he says he will not be able to afford it. He  can take Tylenol for pain. Patient told not to take any NSAIDs while on prednisone.  52 y.o.Jared Beck's evaluation in the Emergency Department is complete. It has been determined that no acute conditions requiring further emergency intervention are present at this time. The patient/guardian have been advised of the diagnosis and plan. We have discussed signs and symptoms that warrant return to the ED, such as changes or worsening in symptoms.  Vital signs are stable at discharge. Filed Vitals:   07/27/13 1742  BP: 111/75  Pulse: 88  Temp: 98.2 F (36.8 C)  Resp: 16     Patient/guardian has voiced understanding and agreed to follow-up with the PCP or specialist.    Dorthula Matasiffany G Ellard Nan, PA-C 07/27/13 1755

## 2013-07-27 NOTE — ED Notes (Signed)
Pt c/o pain to R foot, reports hx of gout.

## 2013-07-28 NOTE — ED Provider Notes (Signed)
Medical screening examination/treatment/procedure(s) were performed by non-physician practitioner and as supervising physician I was immediately available for consultation/collaboration.  Toy BakerAnthony T Rashawnda Gaba, MD 07/28/13 [phone]

## 2013-08-03 ENCOUNTER — Encounter (HOSPITAL_COMMUNITY): Payer: Self-pay | Admitting: Emergency Medicine

## 2013-08-03 ENCOUNTER — Emergency Department (HOSPITAL_COMMUNITY)
Admission: EM | Admit: 2013-08-03 | Discharge: 2013-08-03 | Disposition: A | Discharge status: Home / Self Care | Payer: Disability Insurance | Attending: Emergency Medicine | Admitting: Emergency Medicine

## 2013-08-03 DIAGNOSIS — M79673 Pain in unspecified foot: Secondary | ICD-10-CM

## 2013-08-03 DIAGNOSIS — Z880 Allergy status to penicillin: Secondary | ICD-10-CM | POA: Insufficient documentation

## 2013-08-03 DIAGNOSIS — M1990 Unspecified osteoarthritis, unspecified site: Secondary | ICD-10-CM | POA: Insufficient documentation

## 2013-08-03 DIAGNOSIS — G8929 Other chronic pain: Secondary | ICD-10-CM | POA: Insufficient documentation

## 2013-08-03 DIAGNOSIS — M109 Gout, unspecified: Secondary | ICD-10-CM

## 2013-08-03 DIAGNOSIS — Z9889 Other specified postprocedural states: Secondary | ICD-10-CM | POA: Insufficient documentation

## 2013-08-03 MED ORDER — OXYCODONE-ACETAMINOPHEN 5-325 MG PO TABS: 1.0000 | ORAL_TABLET | ORAL | Status: DC | PRN

## 2013-08-03 MED ORDER — OXYCODONE-ACETAMINOPHEN 5-325 MG PO TABS
1.0000 | ORAL_TABLET | Freq: Once | ORAL | Status: AC
  Administered 2013-08-03: 1 via ORAL
  Filled 2013-08-03: qty 1

## 2013-08-03 MED ORDER — PREDNISONE 50 MG PO TABS
50.0000 mg | ORAL_TABLET | Freq: Once | ORAL | Status: AC
Start: 2013-08-03 — End: 2013-08-03
  Administered 2013-08-03: 50 mg via ORAL
  Filled 2013-08-03: qty 1

## 2013-08-03 MED ORDER — PREDNISONE 50 MG PO TABS: ORAL_TABLET | ORAL | Status: DC

## 2013-08-03 NOTE — Discharge Instructions (Signed)
Gout  Gout is when your joints become red, sore, and swell (inflammed). This is caused by the buildup of uric acid crystals in the joints. Uric acid is a chemical that is normally in the blood. If the level of uric acid gets too high in the blood, these crystals form in your joints and tissues. Over time, these crystals can form into masses near the joints and tissues. These masses can destroy bone and cause the bone to look misshapen (deformed).  HOME CARE   · Do not take aspirin for pain.  · Only take medicine as told by your doctor.  · Rest the joint as much as you can. When in bed, keep sheets and blankets off painful areas.  · Keep the sore joints raised (elevated).  · Put warm or cold packs on painful joints. Use of warm or cold packs depends on which works best for you.  · Use crutches if the painful joint is in your leg.  · Drink enough fluids to keep your pee (urine) clear or pale yellow. Limit alcohol, sugary drinks, and drinks with fructose in them.  · Follow your diet instructions. Pay careful attention to how much protein you eat. Include fruits, vegetables, whole grains, and fat-free or low-fat milk products in your daily diet. Talk to your doctor or dietician about the use of coffee, vitamin C, and cherries. These may help lower uric acid levels.  · Keep a healthy body weight.  GET HELP RIGHT AWAY IF:   · You have watery poop (diarrhea), throw up (vomit), or have any side effects from medicines.  · You do not feel better in 24 hours, or you are getting worse.  · Your joint becomes suddenly more tender, and you have chills or a fever.  MAKE SURE YOU:   · Understand these instructions.  · Will watch your condition.  · Will get help right away if you are not doing well or get worse.  Document Released: 04/13/2008 Document Revised: 10/30/2012 Document Reviewed: 10/13/2009  [employer]® Patient Information ©2014 [employer].

## 2013-08-03 NOTE — ED Notes (Signed)
Pain rt foot,/ankle, chronic from arthritis, No injury.  Seen here recently and treated with prednisone.  Good pedal pulse , foot warm ,dry.. Swelling present.

## 2013-08-03 NOTE — ED Notes (Signed)
Patient has chronic problems w/bilateral foot pain, R worse than L.  Seen here last Friday for c/o R foot, ankle pain and swelling. Was given Prednisone course x 5 days.  This helped ease pain and decreased swelling.  After last dose, pain returned along w/swelling.

## 2013-08-05 NOTE — ED Provider Notes (Signed)
CSN: [id number]     Arrival date & time 08/03/13  1931 History   First MD Initiated Contact with Patient 08/03/13 1947     Chief Complaint  Patient presents with  . Foot Pain   (Consider location/radiation/quality/duration/timing/severity/associated sxs/prior Treatment) Patient is a 53 y.o. male presenting with lower extremity pain. The history is provided by the patient.  Foot Pain This is a chronic problem. The current episode started 1 to 4 weeks ago. The problem occurs constantly. The problem has been gradually improving. Associated symptoms include arthralgias. Pertinent negatives include no chills, fever, joint swelling, nausea, numbness, rash or weakness. The symptoms are aggravated by standing and walking. He has tried NSAIDs and position changes (prednisone) for the symptoms. The treatment provided mild relief.   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  He was seen here recently and treated for same.  He was given prednisone and has been taking aleve.  States the pain was improving but returned as soon as the finished the prednisone.  He denies recent injury, redness , fever, or chills.  Pain feels similar to previous foot pain  Past Medical History  Diagnosis Date  . Arthritis   . Gout   . Gout    Past Surgical History  Procedure Laterality Date  . Cholecystectomy    . Appendectomy    . Hernia repair    . Rotator cuff surgery     Family History  Problem Relation Age of Onset  . Cancer Mother   . Cancer Father    History  Substance Use Topics  . Smoking status: Never Smoker   . Smokeless tobacco: Not on file  . Alcohol Use: No    Review of Systems  Constitutional: Negative for fever and chills.  Gastrointestinal: Negative for nausea.  Genitourinary: Negative for dysuria and difficulty urinating.  Musculoskeletal: Positive for arthralgias. Negative for joint swelling.  Skin: Negative for color change, rash and wound.  Neurological: Negative for weakness and  numbness.  All other systems reviewed and are negative.    Allergies  Penicillins  Home Medications   Current Outpatient Rx  Name  Route  Sig  Dispense  Refill  . naproxen sodium (ALEVE) 220 MG tablet   Oral   Take 220 mg by mouth 2 (two) times daily as needed. For pain         . oxyCODONE-acetaminophen (PERCOCET/ROXICET) 5-325 MG per tablet   Oral   Take 1 tablet by mouth every 4 (four) hours as needed for severe pain.   20 tablet   0   . predniSONE (DELTASONE) 50 MG tablet      One tab po qd x 3 days   3 tablet   0    BP 108/85  Pulse 89  Temp(Src) 97.4 F (36.3 C) (Oral)  Resp 17  Ht 5\' 8"  (1.727 m)  Wt 170 lb (77.111 kg)  BMI 25.85 kg/m2  SpO2 100% Physical Exam  Nursing note and vitals reviewed. Constitutional: He is oriented to person, place, and time. He appears well-developed and well-nourished. No distress.  HENT:  Head: Normocephalic and atraumatic.  Cardiovascular: Normal rate, regular rhythm, normal heart sounds and intact distal pulses.   Pulmonary/Chest: Effort normal and breath sounds normal.  Musculoskeletal: He exhibits tenderness.  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   ttp of the medial aspect of the right foot.   DP pulse is brisk,distal sensation intact.  No erythema, abrasion, or  edema.  No proximal tenderness.  Neurological: He is alert and oriented to person, place, and time. He exhibits normal muscle tone. Coordination normal.  Skin: Skin is warm and dry.    ED Course  Procedures (including critical care time) Labs Review Labs Reviewed - No data to display Imaging Review No results found.  EKG Interpretation   None       MDM   1. Foot pain   2. Gout    Patient with hx of chronic pain of the right foot secondary to OA and gout.  NO concerning sx's for septic joint or cellulitis.  Foot is NV intact.  No proximal tenderness.  Compartments soft.  Will prescribe prednisone short coarse and #20  percocet for pain.  Pt advised to f/u with podiatry.  Referral info given.      Querida Beretta L. Kenedie Dirocco, PA-C 08/05/13 0038

## 2013-08-05 NOTE — ED Provider Notes (Signed)
Medical screening examination/treatment/procedure(s) were performed by non-physician practitioner and as supervising physician I was immediately available for consultation/collaboration.  EKG Interpretation   None        Glynn OctaveStephen Marsa Matteo, MD 08/05/13 [phone]

## 2013-09-27 ENCOUNTER — Other Ambulatory Visit (HOSPITAL_COMMUNITY): Payer: Self-pay | Admitting: Family Medicine

## 2013-09-27 ENCOUNTER — Ambulatory Visit (HOSPITAL_COMMUNITY)
Admission: RE | Admit: 2013-09-27 | Discharge: 2013-09-27 | Disposition: A | Discharge status: Home / Self Care | Payer: Disability Insurance | Source: Ambulatory Visit | Attending: Family Medicine | Admitting: Family Medicine

## 2013-09-27 DIAGNOSIS — M47817 Spondylosis without myelopathy or radiculopathy, lumbosacral region: Secondary | ICD-10-CM | POA: Insufficient documentation

## 2013-09-27 DIAGNOSIS — M545 Low back pain, unspecified: Secondary | ICD-10-CM | POA: Insufficient documentation

## 2013-09-27 DIAGNOSIS — IMO0002 Reserved for concepts with insufficient information to code with codable children: Secondary | ICD-10-CM

## 2013-09-27 DIAGNOSIS — M259 Joint disorder, unspecified: Secondary | ICD-10-CM | POA: Insufficient documentation

## 2013-09-27 DIAGNOSIS — R52 Pain, unspecified: Secondary | ICD-10-CM

## 2013-09-27 DIAGNOSIS — M25519 Pain in unspecified shoulder: Secondary | ICD-10-CM | POA: Insufficient documentation

## 2013-12-13 ENCOUNTER — Emergency Department (HOSPITAL_COMMUNITY)
Admission: EM | Admit: 2013-12-13 | Discharge: 2013-12-13 | Disposition: A | Discharge status: Home / Self Care | Payer: Medicaid Other | Attending: Emergency Medicine | Admitting: Emergency Medicine

## 2013-12-13 ENCOUNTER — Encounter (HOSPITAL_COMMUNITY): Payer: Self-pay | Admitting: Emergency Medicine

## 2013-12-13 DIAGNOSIS — Z9889 Other specified postprocedural states: Secondary | ICD-10-CM | POA: Insufficient documentation

## 2013-12-13 DIAGNOSIS — M109 Gout, unspecified: Secondary | ICD-10-CM

## 2013-12-13 DIAGNOSIS — M549 Dorsalgia, unspecified: Secondary | ICD-10-CM | POA: Insufficient documentation

## 2013-12-13 DIAGNOSIS — G8929 Other chronic pain: Secondary | ICD-10-CM | POA: Insufficient documentation

## 2013-12-13 DIAGNOSIS — IMO0002 Reserved for concepts with insufficient information to code with codable children: Secondary | ICD-10-CM | POA: Insufficient documentation

## 2013-12-13 DIAGNOSIS — Z79899 Other long term (current) drug therapy: Secondary | ICD-10-CM | POA: Insufficient documentation

## 2013-12-13 DIAGNOSIS — Z880 Allergy status to penicillin: Secondary | ICD-10-CM | POA: Insufficient documentation

## 2013-12-13 MED ORDER — PREDNISONE 50 MG PO TABS
60.0000 mg | ORAL_TABLET | Freq: Once | ORAL | Status: AC
  Administered 2013-12-13: 60 mg via ORAL
  Filled 2013-12-13 (×2): qty 1

## 2013-12-13 MED ORDER — PREDNISONE 10 MG PO TABS
40.0000 mg | ORAL_TABLET | Freq: Every day | ORAL | Status: DC
Start: 2013-12-13 — End: 2013-12-23

## 2013-12-13 MED ORDER — HYDROCODONE-ACETAMINOPHEN 5-325 MG PO TABS: 1.0000 | ORAL_TABLET | Freq: Four times a day (QID) | ORAL | Status: DC | PRN

## 2013-12-13 NOTE — ED Provider Notes (Signed)
CSN: [id number]     Arrival date & time 12/13/13  0508 History  This chart was scribed for Vanetta MuldersScott Nancyann Cotterman, MD by Dorothey Basemania Sutton, ED Scribe. This patient was seen in room APA01/APA01 and the patient's care was started at 7:25 AM.   Chief Complaint  Patient presents with  . Gout   Patient is a 53 y.o. male presenting with lower extremity pain and wrist pain. The history is provided by the patient. No language interpreter was used.  Foot Pain This is a recurrent problem. The current episode started 12 to 24 hours ago. The problem occurs constantly. The problem has not changed since onset.Pertinent negatives include no chest pain, no abdominal pain, no headaches and no shortness of breath. The symptoms are aggravated by walking. Nothing relieves the symptoms. He has tried nothing for the symptoms.  Wrist Pain This is a recurrent problem. The current episode started 12 to 24 hours ago. The problem occurs constantly. The problem has not changed since onset.Pertinent negatives include no chest pain, no abdominal pain, no headaches and no shortness of breath. Nothing relieves the symptoms. He has tried nothing for the symptoms.   HPI Comments: Jared Beck is a 53 y.o. Male with a history of gout and arthritis who presents to the Emergency Department complaining of a constant pain to the left foot and right wrist onset yesterday that he states feels similar to his prior history of gout. He denies any potential injury or trauma to the areas. Patient states that the foot pain is exacerbated with walking/bearing weight. He rates the foot pain a 7/10 currently and the wrist pain a 9/10 currently. Patient reports an associated fever (patient is afebrile at 97.7 in the ED). Patient states that his gout has been treated in the past with prednisone and hydrocodone. He reports some back pain that he states is chronic in nature and denies any recent changes. He denies visual disturbance, cough, rhinorrhea, sore throat,  abdominal pain, nausea, vomiting, diarrhea, chest pain, shortness of breath, rash, headache. Patient has an allergy to penicillins. Patient has no other pertinent medical history.    Past Medical History  Diagnosis Date  . Arthritis   . Gout   . Gout    Past Surgical History  Procedure Laterality Date  . Cholecystectomy    . Appendectomy    . Hernia repair    . Rotator cuff surgery     Family History  Problem Relation Age of Onset  . Cancer Mother   . Cancer Father    History  Substance Use Topics  . Smoking status: Never Smoker   . Smokeless tobacco: Not on file  . Alcohol Use: No    Review of Systems  Constitutional: Positive for fever (subjective, resolved). Negative for chills.  HENT: Negative for rhinorrhea and sore throat.   Eyes: Negative for visual disturbance.  Respiratory: Negative for cough and shortness of breath.   Cardiovascular: Negative for chest pain.  Gastrointestinal: Negative for nausea, vomiting, abdominal pain and diarrhea.  Genitourinary: Negative for dysuria and hematuria.  Musculoskeletal: Positive for arthralgias and back pain (chronic, unchanged). Negative for neck pain.  Skin: Negative for rash.  Neurological: Negative for headaches.  Hematological: Does not bruise/bleed easily.  Psychiatric/Behavioral: Negative for confusion.      Allergies  Penicillins  Home Medications   Prior to Admission medications   Medication Sig Start Date End Date Taking? Authorizing Provider  naproxen sodium (ALEVE) 220 MG tablet Take 220 mg by mouth  2 (two) times daily as needed. For pain   Yes Historical Provider, MD  oxyCODONE-acetaminophen (PERCOCET/ROXICET) 5-325 MG per tablet Take 1 tablet by mouth every 4 (four) hours as needed for severe pain. 08/03/13   Tammy L. Triplett, PA-C  predniSONE (DELTASONE) 50 MG tablet One tab po qd x 3 days 08/03/13   Tammy L. Triplett, PA-C   Triage Vitals: BP 124/75  Pulse 72  Temp(Src) 97.7 F (36.5 C) (Oral)   Resp 18  Ht 5\' 8"  (1.727 m)  Wt 180 lb (81.647 kg)  BMI 27.38 kg/m2  SpO2 98%  Physical Exam  Nursing note and vitals reviewed. Constitutional: He is oriented to person, place, and time. He appears well-developed and well-nourished. No distress.  HENT:  Head: Normocephalic and atraumatic.  Eyes: Conjunctivae are normal.  Neck: Normal range of motion. Neck supple.  Cardiovascular: Normal rate, regular rhythm, normal heart sounds and intact distal pulses.   Pulmonary/Chest: Effort normal. No respiratory distress.  Abdominal: He exhibits no distension.  Musculoskeletal: Normal range of motion. He exhibits tenderness.  Swelling to the back of the left hand without tenderness to palpation, increased warmth, erythema.   Right wrist has similar swelling with some increased warmth and mild erythema. Brisk capillary refill. 2+ radial pulse.   No knee swelling. No calf swelling. Capillary refill < 1 second in bilateral great toes.   Neurological: He is alert and oriented to person, place, and time. No cranial nerve deficit. He exhibits normal muscle tone. Coordination normal.  Skin: Skin is warm and dry. There is erythema.  Psychiatric: He has a normal mood and affect. His behavior is normal.    ED Course  Procedures (including critical care time)  DIAGNOSTIC STUDIES: Oxygen Saturation is 98% on room air, normal by my interpretation.    COORDINATION OF CARE: 7:30 AM- Will discharge patient with prednisone and hydrocodone to manage symptoms. Discussed treatment plan with patient at bedside and patient verbalized agreement.     Labs Review Labs Reviewed - No data to display  Imaging Review No results found.   EKG Interpretation None      MDM   Final diagnoses:  Gout attack    Patient with history of gout and history of arthritis. Patient was swelling and some mild redness to right wrist and pain in the left foot. No history of any trauma x-rays not done. Patient's had this  problem in the past. Normally treated with prednisone and pain medication. Patient has primary care Dr. to followup with. Nontoxic no acute distress. Patient given first dose prednisone here in the emergency department.    I personally performed the services described in this documentation, which was scribed in my presence. The recorded information has been reviewed and is accurate.      Vanetta Mulders, MD 12/13/13 [phone]

## 2013-12-13 NOTE — ED Notes (Signed)
nad noted prior to dc. Dc instructions reviewed and explained. Voiced understanding. 2 scripts given.

## 2013-12-13 NOTE — ED Notes (Signed)
Pain in left foot and right wrist since yesterday, relates as being his typical gout pain

## 2013-12-13 NOTE — Discharge Instructions (Signed)
Gout Gout is when your joints become red, sore, and swell (inflammed). This is caused by the buildup of uric acid crystals in the joints. Uric acid is a chemical that is normally in the blood. If the level of uric acid gets too high in the blood, these crystals form in your joints and tissues. Over time, these crystals can form into masses near the joints and tissues. These masses can destroy bone and cause the bone to look misshapen (deformed). HOME CARE   Do not take aspirin for pain.  Only take medicine as told by your doctor.  Rest the joint as much as you can. When in bed, keep sheets and blankets off painful areas.  Keep the sore joints raised (elevated).  Put warm or cold packs on painful joints. Use of warm or cold packs depends on which works best for you.  Use crutches if the painful joint is in your leg.  Drink enough fluids to keep your pee (urine) clear or pale yellow. Limit alcohol, sugary drinks, and drinks with fructose in them.  Follow your diet instructions. Pay careful attention to how much protein you eat. Include fruits, vegetables, whole grains, and fat-free or low-fat milk products in your daily diet. Talk to your doctor or dietician about the use of coffee, vitamin C, and cherries. These may help lower uric acid levels.  Keep a healthy body weight. GET HELP RIGHT AWAY IF:   You have watery poop (diarrhea), throw up (vomit), or have any side effects from medicines.  You do not feel better in 24 hours, or you are getting worse.  Your joint becomes suddenly more tender, and you have chills or a fever. MAKE SURE YOU:   Understand these instructions.  Will watch your condition.  Will get help right away if you are not doing well or get worse. Document Released: 04/13/2008 Document Revised: 10/30/2012 Document Reviewed: 10/13/2009 [facility] Patient Information 2014 [place], [place].  Take medications as directed. Followup with your doctor next few days if not  better. Work note provided.

## 2013-12-23 ENCOUNTER — Encounter (HOSPITAL_COMMUNITY): Payer: Self-pay | Admitting: Emergency Medicine

## 2013-12-23 ENCOUNTER — Emergency Department (HOSPITAL_COMMUNITY)
Admission: EM | Admit: 2013-12-23 | Discharge: 2013-12-23 | Disposition: A | Discharge status: Home / Self Care | Payer: Medicaid Other | Attending: Emergency Medicine | Admitting: Emergency Medicine

## 2013-12-23 DIAGNOSIS — M1A9XX0 Chronic gout, unspecified, without tophus (tophi): Secondary | ICD-10-CM | POA: Insufficient documentation

## 2013-12-23 DIAGNOSIS — Z880 Allergy status to penicillin: Secondary | ICD-10-CM | POA: Insufficient documentation

## 2013-12-23 DIAGNOSIS — Z791 Long term (current) use of non-steroidal anti-inflammatories (NSAID): Secondary | ICD-10-CM | POA: Insufficient documentation

## 2013-12-23 DIAGNOSIS — M1A00X0 Idiopathic chronic gout, unspecified site, without tophus (tophi): Secondary | ICD-10-CM | POA: Insufficient documentation

## 2013-12-23 DIAGNOSIS — Z79899 Other long term (current) drug therapy: Secondary | ICD-10-CM | POA: Insufficient documentation

## 2013-12-23 DIAGNOSIS — M1990 Unspecified osteoarthritis, unspecified site: Secondary | ICD-10-CM

## 2013-12-23 MED ORDER — OXYCODONE-ACETAMINOPHEN 5-325 MG PO TABS: 1.0000 | ORAL_TABLET | ORAL | Status: DC | PRN

## 2013-12-23 MED ORDER — NAPROXEN 500 MG PO TABS: 500.0000 mg | ORAL_TABLET | Freq: Two times a day (BID) | ORAL | Status: DC

## 2013-12-23 NOTE — ED Notes (Signed)
Pt with known dx of arthritis to left wrist, pt states swelling and pain has increased

## 2013-12-23 NOTE — Discharge Instructions (Signed)
Arthritis, Nonspecific °Arthritis is inflammation of a joint. This usually means pain, redness, warmth or swelling are present. One or more joints may be involved. There are a number of types of arthritis. Your caregiver may not be able to tell what type of arthritis you have right away. °CAUSES  °The most common cause of arthritis is the wear and tear on the joint (osteoarthritis). This causes damage to the cartilage, which can break down over time. The knees, hips, back and neck are most often affected by this type of arthritis. °Other types of arthritis and common causes of joint pain include: °· Sprains and other injuries near the joint. Sometimes minor sprains and injuries cause pain and swelling that develop hours later. °· Rheumatoid arthritis. This affects hands, feet and knees. It usually affects both sides of your body at the same time. It is often associated with chronic ailments, fever, weight loss and general weakness. °· Crystal arthritis. Gout and pseudo gout can cause occasional acute severe pain, redness and swelling in the foot, ankle, or knee. °· Infectious arthritis. Bacteria can get into a joint through a break in overlying skin. This can cause infection of the joint. Bacteria and viruses can also spread through the blood and affect your joints. °· Drug, infectious and allergy reactions. Sometimes joints can become mildly painful and slightly swollen with these types of illnesses. °SYMPTOMS  °· Pain is the main symptom. °· Your joint or joints can also be red, swollen and warm or hot to the touch. °· You may have a fever with certain types of arthritis, or even feel overall ill. °· The joint with arthritis will hurt with movement. Stiffness is present with some types of arthritis. °DIAGNOSIS  °Your caregiver will suspect arthritis based on your description of your symptoms and on your exam. Testing may be needed to find the type of arthritis: °· Blood and sometimes urine tests. °· X-ray tests  and sometimes CT or MRI scans. °· Removal of fluid from the joint (arthrocentesis) is done to check for bacteria, crystals or other causes. Your caregiver (or a specialist) will numb the area over the joint with a local anesthetic, and use a needle to remove joint fluid for examination. This procedure is only minimally uncomfortable. °· Even with these tests, your caregiver may not be able to tell what kind of arthritis you have. Consultation with a specialist (rheumatologist) may be helpful. °TREATMENT  °Your caregiver will discuss with you treatment specific to your type of arthritis. If the specific type cannot be determined, then the following general recommendations may apply. °Treatment of severe joint pain includes: °· Rest. °· Elevation. °· Anti-inflammatory medication (for example, ibuprofen) may be prescribed. Avoiding activities that cause increased pain. °· Only take over-the-counter or prescription medicines for pain and discomfort as recommended by your caregiver. °· Cold packs over an inflamed joint may be used for 10 to 15 minutes every hour. Hot packs sometimes feel better, but do not use overnight. Do not use hot packs if you are diabetic without your caregiver's permission. °· A cortisone shot into arthritic joints may help reduce pain and swelling. °· Any acute arthritis that gets worse over the next 1 to 2 days needs to be looked at to be sure there is no joint infection. °Long-term arthritis treatment involves modifying activities and lifestyle to reduce joint stress jarring. This can include weight loss. Also, exercise is needed to nourish the joint cartilage and remove waste. This helps keep the muscles   around the joint strong. HOME CARE INSTRUCTIONS   Do not take aspirin to relieve pain if gout is suspected. This elevates uric acid levels.  Only take over-the-counter or prescription medicines for pain, discomfort or fever as directed by your caregiver.  Rest the joint as much as  possible.  If your joint is swollen, keep it elevated.  Use crutches if the painful joint is in your leg.  Drinking plenty of fluids may help for certain types of arthritis.  Follow your caregiver's dietary instructions.  Try low-impact exercise such as:  Swimming.  Water aerobics.  Biking.  Walking.  Morning stiffness is often relieved by a warm shower.  Put your joints through regular range-of-motion. SEEK MEDICAL CARE IF:   You do not feel better in 24 hours or are getting worse.  You have side effects to medications, or are not getting better with treatment. SEEK IMMEDIATE MEDICAL CARE IF:   You have a fever.  You develop severe joint pain, swelling or redness.  Many joints are involved and become painful and swollen.  There is severe back pain and/or leg weakness.  You have loss of bowel or bladder control. Document Released: 08/12/2004 Document Revised: 09/27/2011 Document Reviewed: 08/28/2008 [facility] Patient Information 2014 [place], [place].    You may take the oxycodone prescribed for pain relief.  This will make you drowsy - do not drive within 4 hours of taking this medication.  Plan to establish care with Dr Janna Arch as planned.

## 2013-12-23 NOTE — ED Provider Notes (Signed)
CSN: [id number]     Arrival date & time 12/23/13  1046 History  This chart was scribed for non-physician practitioner Burgess Amor, PA-C working with Laray Anger, DO by Danella Maiers, ED Scribe. This patient was seen in room APFT22/APFT22 and the patient's care was started at 12:30 PM.    Chief Complaint  Patient presents with  . Arthritis   The history is provided by the patient. No language interpreter was used.   HPI Comments: Jared Beck is a 53 y.o. male with a h/o gout-related arthritis who presents to the Emergency Department complaining of worsening bilateral wrist pain with associated swelling. He describes the pain as aching. He states it feels the same as the arthritic pain he has had in the past, but more severe. He denies injuries or falls. He was seen here one week ago and told he was having a gout flare-up in his right wrist. He also reports bilateral foot pain when he is on his feet for a while. He works at [employer] and has to be on his feet a lot and move furniture. He usually takes Aleve for the pain. He has not been taking Aleve since he started a course of prednisone, which he finished 4 days ago. He states he has been on prednisone "a lot" in the past year. States it helps a little.  He denies fevers or chills.  He has taken colchicine in the past for chronic daily use with some relief but states cannot afford that medicine currently.  He is scheduled to establish care with Dr. Janna Arch within the next month.  PCP - Oval Linsey   Past Medical History  Diagnosis Date  . Arthritis   . Gout   . Gout    Past Surgical History  Procedure Laterality Date  . Cholecystectomy    . Appendectomy    . Hernia repair    . Rotator cuff surgery     Family History  Problem Relation Age of Onset  . Cancer Mother   . Cancer Father    History  Substance Use Topics  . Smoking status: Never Smoker   . Smokeless tobacco: Not on file  . Alcohol Use: No    Review  of Systems  Constitutional: Negative for fever.  Musculoskeletal: Positive for arthralgias and joint swelling. Negative for myalgias.  Neurological: Negative for weakness and numbness.  All other systems reviewed and are negative.     Allergies  Penicillins  Home Medications   Prior to Admission medications   Medication Sig Start Date End Date Taking? Authorizing Provider  naproxen (NAPROSYN) 500 MG tablet Take 1 tablet (500 mg total) by mouth 2 (two) times daily. 12/23/13   Burgess Amor, PA-C  oxyCODONE-acetaminophen (PERCOCET/ROXICET) 5-325 MG per tablet Take 1 tablet by mouth every 4 (four) hours as needed for severe pain. 12/23/13   Burgess Amor, PA-C   BP 121/62  Pulse 74  Temp(Src) 97.6 F (36.4 C) (Oral)  Resp 16  Ht 5\' 8"  (1.727 m)  Wt 173 lb (78.472 kg)  BMI 26.31 kg/m2  SpO2 100% Physical Exam  Constitutional: He appears well-developed and well-nourished.  HENT:  Head: Atraumatic.  Eyes: Conjunctivae are normal.  Neck: Normal range of motion.  Cardiovascular: Normal rate.   Pulses equal bilaterally  Pulmonary/Chest: Effort normal. No respiratory distress.  Musculoskeletal: He exhibits tenderness.  generalized pain across bilateral wrists with tophi nodules and deformity of several fingers.  No edema or erythema in the wrists  or hands. Equal radial pulses.  No erythema or increased warmth.  He does not have increased pain with touch.  Neurological: He is alert. He has normal strength. He displays normal reflexes. No sensory deficit.  Skin: Skin is warm and dry.  Psychiatric: He has a normal mood and affect.    ED Course  Procedures (including critical care time) Medications - No data to display  DIAGNOSTIC STUDIES: Oxygen Saturation is 100% on RA, normal by my interpretation.    COORDINATION OF CARE: 1:42 PM- Discussed treatment plan with pt which includes discharge home with percocet. Pt agrees to plan.    Labs Review Labs Reviewed - No data to  display  Imaging Review No results found.   EKG Interpretation None      MDM   Final diagnoses:  Arthritis    Pt with chronic gouty arthritis changes.  Exam is not consistent with acute flare as he has no edema or erythema.  He was prescribed oxycodone.  Encouraged warm compresses or heat pad.  F/u with new pcp as planned.  I personally performed the services described in this documentation, which was scribed in my presence. The recorded information has been reviewed and is accurate.   Burgess AmorJulie Cieara Stierwalt, PA-C 12/24/13 2050

## 2013-12-23 NOTE — ED Notes (Signed)
Pt verbalized understanding to use caution and no driving within 4 hours of taking percocet due to med causes drowsiness, pt also made aware that med causes constipation as well

## 2013-12-25 NOTE — ED Provider Notes (Signed)
Medical screening examination/treatment/procedure(s) were performed by non-physician practitioner and as supervising physician I was immediately available for consultation/collaboration.   EKG Interpretation None        Geovany Trudo M Damondre Pfeifle, DO 12/25/13 1136 

## 2014-02-13 ENCOUNTER — Emergency Department (HOSPITAL_COMMUNITY)
Admission: EM | Admit: 2014-02-13 | Discharge: 2014-02-13 | Disposition: A | Discharge status: Home / Self Care | Payer: Medicaid Other | Attending: Emergency Medicine | Admitting: Emergency Medicine

## 2014-02-13 ENCOUNTER — Encounter (HOSPITAL_COMMUNITY): Payer: Self-pay | Admitting: Emergency Medicine

## 2014-02-13 DIAGNOSIS — M2550 Pain in unspecified joint: Secondary | ICD-10-CM

## 2014-02-13 DIAGNOSIS — X30XXXA Exposure to excessive natural heat, initial encounter: Secondary | ICD-10-CM | POA: Diagnosis not present

## 2014-02-13 DIAGNOSIS — M25579 Pain in unspecified ankle and joints of unspecified foot: Secondary | ICD-10-CM | POA: Insufficient documentation

## 2014-02-13 DIAGNOSIS — M79609 Pain in unspecified limb: Secondary | ICD-10-CM | POA: Diagnosis present

## 2014-02-13 DIAGNOSIS — M109 Gout, unspecified: Secondary | ICD-10-CM | POA: Diagnosis not present

## 2014-02-13 DIAGNOSIS — Z880 Allergy status to penicillin: Secondary | ICD-10-CM | POA: Diagnosis not present

## 2014-02-13 DIAGNOSIS — Y9289 Other specified places as the place of occurrence of the external cause: Secondary | ICD-10-CM | POA: Diagnosis not present

## 2014-02-13 DIAGNOSIS — T678XXA Other effects of heat and light, initial encounter: Secondary | ICD-10-CM | POA: Insufficient documentation

## 2014-02-13 DIAGNOSIS — Y9389 Activity, other specified: Secondary | ICD-10-CM | POA: Diagnosis not present

## 2014-02-13 DIAGNOSIS — R197 Diarrhea, unspecified: Secondary | ICD-10-CM | POA: Diagnosis not present

## 2014-02-13 DIAGNOSIS — Z791 Long term (current) use of non-steroidal anti-inflammatories (NSAID): Secondary | ICD-10-CM | POA: Insufficient documentation

## 2014-02-13 DIAGNOSIS — T679XXA Effect of heat and light, unspecified, initial encounter: Secondary | ICD-10-CM

## 2014-02-13 LAB — I-STAT CHEM 8, ED
BUN: 7 mg/dL (ref 6–23)
CREATININE: 1 mg/dL (ref 0.50–1.35)
Calcium, Ion: 1.15 mmol/L (ref 1.12–1.23)
Chloride: 99 mEq/L (ref 96–112)
Glucose, Bld: 90 mg/dL (ref 70–99)
HEMATOCRIT: 40 % (ref 39.0–52.0)
HEMOGLOBIN: 13.6 g/dL (ref 13.0–17.0)
POTASSIUM: 3.5 meq/L — AB (ref 3.7–5.3)
SODIUM: 137 meq/L (ref 137–147)
TCO2: 26 mmol/L (ref 0–100)

## 2014-02-13 MED ORDER — HYDROCODONE-ACETAMINOPHEN 5-325 MG PO TABS: 2.0000 | ORAL_TABLET | ORAL | Status: DC | PRN

## 2014-02-13 MED ORDER — POTASSIUM CHLORIDE CRYS ER 20 MEQ PO TBCR
40.0000 meq | EXTENDED_RELEASE_TABLET | Freq: Once | ORAL | Status: AC
  Administered 2014-02-13: 40 meq via ORAL
  Filled 2014-02-13: qty 2

## 2014-02-13 MED ORDER — IBUPROFEN 800 MG PO TABS: 800.0000 mg | ORAL_TABLET | Freq: Three times a day (TID) | ORAL | Status: DC

## 2014-02-13 MED ORDER — SODIUM CHLORIDE 0.9 % IV BOLUS (SEPSIS)
1000.0000 mL | Freq: Once | INTRAVENOUS | Status: AC
  Administered 2014-02-13: 1000 mL via INTRAVENOUS

## 2014-02-13 NOTE — ED Provider Notes (Signed)
CSN: [id number]     Arrival date & time 02/13/14  2156 History  This chart was scribed for Gilda Creasehristopher J. Pollina, * by Milly JakobJohn Lee Graves, ED Scribe. The patient was seen in room APA01/APA01. Patient's care was started at 10:10 PM.  Chief Complaint  Patient presents with  . Leg Pain   The history is provided by the patient. No language interpreter was used.   HPI Comments: Jared Beck is a 53 y.o. male with a history of rheumatoid arthritis and gout who presents to the Emergency Department complaining of bilateral leg cramps and ankle pain onset 2 days ago. He reports diarrhea, which he attributes to lack of eating and extra hydration for the past few days. He reports that he has been staying in his car and walking around in the heat for the past 7 days.   Past Medical History  Diagnosis Date  . Arthritis   . Gout   . Gout    Past Surgical History  Procedure Laterality Date  . Cholecystectomy    . Appendectomy    . Hernia repair    . Rotator cuff surgery     Family History  Problem Relation Age of Onset  . Cancer Mother   . Cancer Father    History  Substance Use Topics  . Smoking status: Never Smoker   . Smokeless tobacco: Not on file  . Alcohol Use: No    Review of Systems  Gastrointestinal: Positive for diarrhea.  Musculoskeletal: Positive for arthralgias (bilateral ankle pain) and myalgias (bilateral leg cramps).  All other systems reviewed and are negative.  Allergies  Penicillins  Home Medications   Prior to Admission medications   Medication Sig Start Date End Date Taking? Authorizing Provider  naproxen (NAPROSYN) 500 MG tablet Take 1 tablet (500 mg total) by mouth 2 (two) times daily. 12/23/13   Burgess AmorJulie Idol, PA-C  oxyCODONE-acetaminophen (PERCOCET/ROXICET) 5-325 MG per tablet Take 1 tablet by mouth every 4 (four) hours as needed for severe pain. 12/23/13   Burgess AmorJulie Idol, PA-C   Triage Vitals: BP 122/74  Pulse 97  Temp(Src) 97.8 F (36.6 C) (Oral)  Resp 22  Wt  174 lb (78.926 kg)  SpO2 99% Physical Exam  Constitutional: He is oriented to person, place, and time. He appears well-developed and well-nourished. No distress.  HENT:  Head: Normocephalic and atraumatic.  Right Ear: Hearing normal.  Left Ear: Hearing normal.  Nose: Nose normal.  Mouth/Throat: Oropharynx is clear and moist and mucous membranes are normal.  Eyes: Conjunctivae and EOM are normal. Pupils are equal, round, and reactive to light.  Neck: Normal range of motion. Neck supple.  Cardiovascular: Regular rhythm, S1 normal and S2 normal.  Exam reveals no gallop and no friction rub.   No murmur heard. Pulmonary/Chest: Effort normal and breath sounds normal. No respiratory distress. He exhibits no tenderness.  Abdominal: Soft. Normal appearance and bowel sounds are normal. There is no hepatosplenomegaly. There is no tenderness. There is no rebound, no guarding, no tenderness at McBurney's point and negative Murphy's sign. No hernia.  Musculoskeletal: Normal range of motion. He exhibits tenderness (bilateral ankles. No swelling, no effusion, no erythema, no warmth.). He exhibits no edema.  Neurological: He is alert and oriented to person, place, and time. He has normal strength. No cranial nerve deficit or sensory deficit. Coordination normal. GCS eye subscore is 4. GCS verbal subscore is 5. GCS motor subscore is 6.  Skin: Skin is warm, dry and intact. No rash  noted. No cyanosis or erythema.  Psychiatric: He has a normal mood and affect. His speech is normal and behavior is normal. Thought content normal.    ED Course  Procedures (including critical care time) DIAGNOSTIC STUDIES: Oxygen Saturation is 99% on room air, normal by my interpretation.    COORDINATION OF CARE: 10:14 PM-Discussed treatment plan with pt at bedside and pt agreed to plan.   Labs Review Labs Reviewed - No data to display  Imaging Review No results found.   EKG Interpretation None      MDM   Final  diagnoses:  None   arthralgia  Heat exposure   Patient presented to the ER for evaluation of pain in the ankles bilaterally. Patient does have history of gout as well as rheumatoid arthritis. Patient reports that he had a dispute with his significant other and has been staying in his car for the last 5 days. He has been doing a lot of walking as well. The patient has been to the heat, cramps from hypokalemia and dehydration was considered. Patient administered IV fluids. Potassium is 3.5, was given repletion here in the ER. Examination of the joints does not reveal any swelling, effusion, redness, I do not suspect gout. This is likely from increased ambulation and walking and his known arthritis. Will be treated with rest and analgesia.  Patient did develop diarrhea today. He has not had any diarrhea here in the ER. Abdominal exam is benign, nontender.  I personally performed the services described in this documentation, which was scribed in my presence. The recorded information has been reviewed and is accurate.     Gilda Crease, MD 02/13/14 2312

## 2014-02-13 NOTE — ED Notes (Signed)
Leg cramps for the past 3-4 days, ankles have been burning. Also had some diarrhea for the past few days.

## 2014-02-13 NOTE — Discharge Instructions (Signed)
Arthralgia °Your caregiver has diagnosed you as suffering from an arthralgia. Arthralgia means there is pain in a joint. This can come from many reasons including: °· Bruising the joint which causes soreness (inflammation) in the joint. °· Wear and tear on the joints which occur as we grow older (osteoarthritis). °· Overusing the joint. °· Various forms of arthritis. °· Infections of the joint. °Regardless of the cause of pain in your joint, most of these different pains respond to anti-inflammatory drugs and rest. The exception to this is when a joint is infected, and these cases are treated with antibiotics, if it is a bacterial infection. °HOME CARE INSTRUCTIONS  °· Rest the injured area for as long as directed by your caregiver. Then slowly start using the joint as directed by your caregiver and as the pain allows. Crutches as directed may be useful if the ankles, knees or hips are involved. If the knee was splinted or casted, continue use and care as directed. If an stretchy or elastic wrapping bandage has been applied today, it should be removed and re-applied every 3 to 4 hours. It should not be applied tightly, but firmly enough to keep swelling down. Watch toes and feet for swelling, bluish discoloration, coldness, numbness or excessive pain. If any of these problems (symptoms) occur, remove the ace bandage and re-apply more loosely. If these symptoms persist, contact your caregiver or return to this location. °· For the first 24 hours, keep the injured extremity elevated on pillows while lying down. °· Apply ice for 15-20 minutes to the sore joint every couple hours while awake for the first half day. Then 03-04 times per day for the first 48 hours. Put the ice in a plastic bag and place a towel between the bag of ice and your skin. °· Wear any splinting, casting, elastic bandage applications, or slings as instructed. °· Only take over-the-counter or prescription medicines for pain, discomfort, or fever as  directed by your caregiver. Do not use aspirin immediately after the injury unless instructed by your physician. Aspirin can cause increased bleeding and bruising of the tissues. °· If you were given crutches, continue to use them as instructed and do not resume weight bearing on the sore joint until instructed. °Persistent pain and inability to use the sore joint as directed for more than 2 to 3 days are warning signs indicating that you should see a caregiver for a follow-up visit as soon as possible. Initially, a hairline fracture (break in bone) may not be evident on X-rays. Persistent pain and swelling indicate that further evaluation, non-weight bearing or use of the joint (use of crutches or slings as instructed), or further X-rays are indicated. X-rays may sometimes not show a small fracture until a week or 10 days later. Make a follow-up appointment with your own caregiver or one to whom we have referred you. A radiologist (specialist in reading X-rays) may read your X-rays. Make sure you know how you are to obtain your X-ray results. Do not assume everything is normal if you do not hear from us. °SEEK MEDICAL CARE IF: °Bruising, swelling, or pain increases. °SEEK IMMEDIATE MEDICAL CARE IF:  °· Your fingers or toes are numb or blue. °· The pain is not responding to medications and continues to stay the same or get worse. °· The pain in your joint becomes severe. °· You develop a fever over 102° F (38.9° C). °· It becomes impossible to move or use the joint. °MAKE SURE YOU:  °·   Understand these instructions.  Will watch your condition.  Will get help right away if you are not doing well or get worse. Document Released: 07/05/2005 Document Revised: 09/27/2011 Document Reviewed: 02/21/2008 [facility] Patient Information 2015 [place], [place]. This information is not intended to replace advice given to you by your health care provider. Make sure you discuss any questions you have with your health care  provider.  Heat-Related Illness Heat-related illnesses occur when the body is unable to properly cool itself. The body normally cools itself by sweating. However, under some conditions sweating is not enough. In these cases, a person's body temperature rises rapidly. Very high body temperatures may damage the brain or other vital organs. Some examples of heat-related illnesses include:  Heat stroke. This occurs when the body is unable to regulate its temperature. The body's temperature rises rapidly, the sweating mechanism fails, and the body is unable to cool down. Body temperature may rise to 106 F (41 C) or higher within 10 to 15 minutes. Heat stroke can cause death or permanent disability if emergency treatment is not provided.  Heat exhaustion. This is a milder form of heat-related illness that can develop after several days of exposure to high temperatures and not enough fluids. It is the body's response to an excessive loss of the water and salt contained in sweat.  Heat cramps. These usually affect people who sweat a lot during heavy activity. This sweating drains the body's salt and moisture. The low salt level in the muscles causes painful cramps. Heat cramps may also be a symptom of heat exhaustion. Heat cramps usually occur in the abdomen, arms, or legs. Get medical attention for cramps if you have heart problems or are on a low-sodium diet. Those that are at greatest risk for heat-related illnesses include:   The elderly.  Infant and the very young.  People with mental illness and chronic diseases.  People who are overweight (obese).  Young and healthy people can even succumb to heat if they participate in strenuous physical activities during hot weather. CAUSES  Several factors affect the body's ability to cool itself during extremely hot weather. When the humidity is high, sweat will not evaporate as quickly. This prevents the body from releasing heat quickly. Other factors  that can affect the body's ability to cool down include:   Age.  Obesity.  Fever.  Dehydration.  Heart disease.  Mental illness.  Poor circulation.  Sunburn.  Prescription drug use.  Alcohol use. SYMPTOMS  Heat stroke: Warning signs of heat stroke vary, but may include:  An extremely high body temperature (above 103F orally).  A fast, strong pulse.  Dizziness.  Confusion.  Red, hot, and dry skin.  No sweating.  Throbbing headache.  Feeling sick to your stomach (nauseous).  Unconsciousness. Heat exhaustion: Warning signs of heat exhaustion include:  Heavy sweating.  Tiredness.  Headache.  Paleness.  Weakness.  Feeling sick to your stomach (nauseous) or vomiting.  Muscle cramps. Heat cramps  Muscle pains or spasms. TREATMENT  Heat stroke  Get into a cool environment. An indoor place that is air-conditioned may be best.  Take a cool shower or bath. Have someone around to make sure you are okay.  Take your temperature. Make sure it is going down. Heat exhaustion  Drink plenty of fluids. Do not drink liquids that contain caffeine, alcohol, or large amounts of sugar. These cause you to lose more body fluid. Also, avoid very cold drinks. They can cause stomach cramps.  Get  into a cool environment. An indoor place that is air-conditioned may be best.  Take a cool shower or bath. Have someone around to make sure you are okay.  Put on lightweight clothing. Heat cramps  Stop whatever activity you were doing. Do not attempt to do that activity for at least 3 hours after the cramps have gone away.  Get into a cool environment. An indoor place that is air-conditioned may be best. HOME CARE INSTRUCTIONS  To protect your health when temperatures are extremely high, follow these tips:  During heavy exercise in a hot environment, drink two to four glasses (16-32 ounces) of cool fluids each hour. Do not wait until you are thirsty to drink. Warning: If  your caregiver limits the amount of fluid you drink or has you on water pills, ask how much you should drink while the weather is hot.  Do not drink liquids that contain caffeine, alcohol, or large amounts of sugar. These cause you to lose more body fluid.  Avoid very cold drinks. They can cause stomach cramps.  Wear appropriate clothing. Choose lightweight, light-colored, loose-fitting clothing.  If you must be outdoors, try to limit your outdoor activity to morning and evening hours. Try to rest often in shady areas.  If you are not used to working or exercising in a hot environment, start slowly and pick up the pace gradually.  Stay cool in an air-conditioned place if possible. If your home does not have air conditioning, go to the shopping mall or [employer].  Taking a cool shower or bath may help you cool off. SEEK MEDICAL CARE IF:   You see any of the symptoms listed above. You may be dealing with a life-threatening emergency.  Symptoms worsen or last longer than 1 hour.  Heat cramps do not get better in 1 hour. MAKE SURE YOU:   Understand these instructions.  Will watch your condition.  Will get help right away if you are not doing well or get worse. Document Released: 04/13/2008 Document Revised: 09/27/2011 Document Reviewed: 04/13/2008 [facility] Patient Information 2015 [place], [place]. This information is not intended to replace advice given to you by your health care provider. Make sure you discuss any questions you have with your health care provider.

## 2014-03-20 ENCOUNTER — Encounter (HOSPITAL_COMMUNITY): Payer: Self-pay | Admitting: Emergency Medicine

## 2014-03-20 ENCOUNTER — Emergency Department (HOSPITAL_COMMUNITY)
Admission: EM | Admit: 2014-03-20 | Discharge: 2014-03-20 | Disposition: A | Discharge status: Home / Self Care | Payer: Medicaid Other | Attending: Emergency Medicine | Admitting: Emergency Medicine

## 2014-03-20 ENCOUNTER — Emergency Department (HOSPITAL_COMMUNITY): Payer: Medicaid Other

## 2014-03-20 DIAGNOSIS — S99919A Unspecified injury of unspecified ankle, initial encounter: Secondary | ICD-10-CM | POA: Diagnosis present

## 2014-03-20 DIAGNOSIS — Y9389 Activity, other specified: Secondary | ICD-10-CM | POA: Diagnosis not present

## 2014-03-20 DIAGNOSIS — Z880 Allergy status to penicillin: Secondary | ICD-10-CM | POA: Insufficient documentation

## 2014-03-20 DIAGNOSIS — W2203XA Walked into furniture, initial encounter: Secondary | ICD-10-CM | POA: Diagnosis not present

## 2014-03-20 DIAGNOSIS — Z791 Long term (current) use of non-steroidal anti-inflammatories (NSAID): Secondary | ICD-10-CM | POA: Insufficient documentation

## 2014-03-20 DIAGNOSIS — Y929 Unspecified place or not applicable: Secondary | ICD-10-CM | POA: Diagnosis not present

## 2014-03-20 DIAGNOSIS — M109 Gout, unspecified: Secondary | ICD-10-CM | POA: Insufficient documentation

## 2014-03-20 DIAGNOSIS — M79671 Pain in right foot: Secondary | ICD-10-CM

## 2014-03-20 DIAGNOSIS — S99929A Unspecified injury of unspecified foot, initial encounter: Principal | ICD-10-CM

## 2014-03-20 DIAGNOSIS — S8990XA Unspecified injury of unspecified lower leg, initial encounter: Secondary | ICD-10-CM | POA: Insufficient documentation

## 2014-03-20 MED ORDER — HYDROCODONE-ACETAMINOPHEN 5-325 MG PO TABS: 2.0000 | ORAL_TABLET | ORAL | Status: DC | PRN

## 2014-03-20 MED ORDER — DOXYCYCLINE HYCLATE 100 MG PO CAPS: 100.0000 mg | ORAL_CAPSULE | Freq: Two times a day (BID) | ORAL | Status: DC

## 2014-03-20 NOTE — ED Notes (Signed)
Patient complaining of right foot swelling x 2 days. States "I stumped my toe a couple of nights ago but didn't hurt it." States he has history of arthritis and gout.

## 2014-03-20 NOTE — ED Provider Notes (Signed)
CSN: [id number]     Arrival date & time 03/20/14  2040 History  This chart was scribed for Jared Octave, MD by Bronson Curb, ED Scribe. This patient was seen in room APA06/APA06 and the patient's care was started at 9:22 PM.      Chief Complaint  Patient presents with  . Foot Pain      The history is provided by the patient. No language interpreter was used.    HPI Comments: Jared Beck is a 53 y.o. male who presents to the Emergency Department complaining of right foot pain onset 2 days ago. Pateint states he stubbed the fifth toe on his right foot on the wooden leg of his couch, and has experienced pain and swelling since. He denies CP, SOB, emesis, abdominal pain, neck pain, back pain, dizziness, or light headedness. He has taken naproxen without significant improvement. Patient has history of arthritis and gout.    Past Medical History  Diagnosis Date  . Arthritis   . Gout   . Gout   . Arthritis    Past Surgical History  Procedure Laterality Date  . Cholecystectomy    . Appendectomy    . Hernia repair    . Rotator cuff surgery     Family History  Problem Relation Age of Onset  . Cancer Mother   . Cancer Father    History  Substance Use Topics  . Smoking status: Never Smoker   . Smokeless tobacco: Not on file  . Alcohol Use: No    Review of Systems A complete 10 system review of systems was obtained and all systems are negative except as noted in the HPI and PMH.     Allergies  Penicillins  Home Medications   Prior to Admission medications   Medication Sig Start Date End Date Taking? Authorizing Provider  naproxen (NAPROSYN) 500 MG tablet Take 500 mg by mouth 2 (two) times daily with a meal.   Yes Historical Provider, MD  doxycycline (VIBRAMYCIN) 100 MG capsule Take 1 capsule (100 mg total) by mouth 2 (two) times daily. 03/20/14   Jared Octave, MD  HYDROcodone-acetaminophen (NORCO/VICODIN) 5-325 MG per tablet Take 2 tablets by mouth every 4  (four) hours as needed. 03/20/14   Jared Octave, MD   Triage Vitals: BP 130/66  Pulse 74  Temp(Src) 98.2 F (36.8 C) (Oral)  Resp 20  Ht  (1.727 m)  Wt 173 lb (78.472 kg)  BMI 26.31 kg/m2  SpO2 99%  Physical Exam  Nursing note and vitals reviewed. Constitutional: He is oriented to person, place, and time. He appears well-developed and well-nourished. No distress.  HENT:  Head: Normocephalic and atraumatic.  Mouth/Throat: Oropharynx is clear and moist. No oropharyngeal exudate.  Eyes: Conjunctivae and EOM are normal. Pupils are equal, round, and reactive to light.  Neck: Normal range of motion. Neck supple.  No meningismus.  Cardiovascular: Normal rate, regular rhythm, normal heart sounds and intact distal pulses.   No murmur heard. Pulmonary/Chest: Effort normal and breath sounds normal. No respiratory distress.  Abdominal: Soft. There is no tenderness. There is no rebound and no guarding.  Musculoskeletal: Normal range of motion. He exhibits no edema and no tenderness.  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 5th toe is erythematous without open wound. No red streaking Diffuse swelling of the entire right foot with tenderness along the lateral aspect. Intact DP and PT pulses. FROM of the ankle. No erythema no open wounds. No calf swelling or  tenderness.  Neurological: He is alert and oriented to person, place, and time. No cranial nerve deficit. He exhibits normal muscle tone. Coordination normal.  No ataxia on finger to nose bilaterally. No pronator drift. 5/5 strength throughout. CN 2-12 intact. Negative Romberg. Equal grip strength. Sensation intact. Gait is normal.   Skin: Skin is warm.  Psychiatric: He has a normal mood and affect. His behavior is normal.    ED Course  Procedures (including critical care time)  DIAGNOSTIC STUDIES: Oxygen Saturation is 99% on room air, normal by my interpretation.    COORDINATION OF CARE: At 2127 Discussed treatment plan with  patient which includes imaging. Patient agrees.   Labs Review Labs Reviewed - No data to display  Imaging Review Dg Ankle Complete Right  03/20/2014   CLINICAL DATA:  Lateral right ankle pain and swelling. Recent trauma. History of arthritis and gout.  EXAM: RIGHT ANKLE - COMPLETE 3+ VIEW  COMPARISON:  06/15/2013 radiographs  FINDINGS: Soft tissue swelling about the ankle. Ankle mortise intact. No displaced acute fracture or dislocation. Advanced atherosclerotic vascular calcifications. Several small rounded soft tissue calcifications anterior to the mid to distal tibia. Severe talonavicular hypertrophic/ degenerative changes. See separate foot radiograph report.  IMPRESSION: Soft tissue swelling without acute osseous finding of the right ankle.   Electronically Signed   By: Jearld Lesch M.D.   On: 03/20/2014 21:58   Dg Foot Complete Right  03/20/2014   CLINICAL DATA:  Lateral right foot and ankle pain and swelling.  EXAM: RIGHT FOOT COMPLETE - 3+ VIEW  COMPARISON:  Contemporaneous ankle radiographs, 11/30/2013 radiographs  FINDINGS: Increased soft tissue swelling about the foot. Medial angulation and hyperextension at the fifth MTP. Hyperflexion at the second PIP. Lisfranc joint intact. No displaced acute fracture or dislocation. 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  IMPRESSION: Increased soft tissue swelling about the foot. No acute osseous finding.   Electronically Signed   By: Jearld Lesch M.D.   On: 03/20/2014 22:01     EKG Interpretation None      MDM   Final diagnoses:  Foot pain, right   Right foot pain and swelling after hitting it on a piece of furniture 2 days ago. No fever. No weakness, numbness or tingling.  There is swelling to the entire right foot with slight area of erythema overlying the fifth toe. Neurovascular intact.  X-rays negative for fracture. Will give empiric Antibiotics for possible early cellulitis   discussed pain control, ice, elevation, followup with PCP. Return to ED sooner with spreading redness, worsening swelling or fever or any other concerns   I personally performed the services described in this documentation, which was scribed in my presence. The recorded information has been reviewed and is accurate.   Jared Octave, MD 03/20/14 [phone]

## 2014-03-20 NOTE — Discharge Instructions (Signed)
Foot Contusion Take the antibiotic for a possible early infection. Return to the ED if you develop worsening pain, swelling, fever or any other concerns. A foot contusion is a deep bruise to the foot. Contusions are the result of an injury that caused bleeding under the skin. The contusion may turn blue, purple, or yellow. Minor injuries will give you a painless contusion, but more severe contusions may stay painful and swollen for a few weeks. CAUSES  A foot contusion comes from a direct blow to that area, such as a heavy object falling on the foot. SYMPTOMS   Swelling of the foot.  Discoloration of the foot.  Tenderness or soreness of the foot. DIAGNOSIS  You will have a physical exam and will be asked about your history. You may need an X-ray of your foot to look for a broken bone (fracture).  TREATMENT  An elastic wrap may be recommended to support your foot. Resting, elevating, and applying cold compresses to your foot are often the best treatments for a foot contusion. Over-the-counter medicines may also be recommended for pain control. HOME CARE INSTRUCTIONS   Put ice on the injured area.  Put ice in a plastic bag.  Place a towel between your skin and the bag.  Leave the ice on for 15-20 minutes, 03-04 times a day.  Only take over-the-counter or prescription medicines for pain, discomfort, or fever as directed by your caregiver.  If told, use an elastic wrap as directed. This can help reduce swelling. You may remove the wrap for sleeping, showering, and bathing. If your toes become numb, cold, or blue, take the wrap off and reapply it more loosely.  Elevate your foot with pillows to reduce swelling.  Try to avoid standing or walking while the foot is painful. Do not resume use until instructed by your caregiver. Then, begin use gradually. If pain develops, decrease use. Gradually increase activities that do not cause discomfort until you have normal use of your foot.  See  your caregiver as directed. It is very important to keep all follow-up appointments in order to avoid any lasting problems with your foot, including long-term (chronic) pain. SEEK IMMEDIATE MEDICAL CARE IF:   You have increased redness, swelling, or pain in your foot.  Your swelling or pain is not relieved with medicines.  You have loss of feeling in your foot or are unable to move your toes.  Your foot turns cold or blue.  You have pain when you move your toes.  Your foot becomes warm to the touch.  Your contusion does not improve in 2 days. MAKE SURE YOU:   Understand these instructions.  Will watch your condition.  Will get help right away if you are not doing well or get worse. Document Released: 04/26/2006 Document Revised: 01/04/2012 Document Reviewed: 06/08/2011 [facility] Patient Information 2015 [place], [place]. This information is not intended to replace advice given to you by your health care provider. Make sure you discuss any questions you have with your health care provider.

## 2014-03-26 DIAGNOSIS — M19049 Primary osteoarthritis, unspecified hand: Secondary | ICD-10-CM | POA: Insufficient documentation

## 2014-03-26 DIAGNOSIS — M19079 Primary osteoarthritis, unspecified ankle and foot: Secondary | ICD-10-CM | POA: Insufficient documentation

## 2014-04-09 ENCOUNTER — Emergency Department (HOSPITAL_COMMUNITY): Payer: Medicaid Other

## 2014-04-09 ENCOUNTER — Encounter (HOSPITAL_COMMUNITY): Payer: Self-pay | Admitting: Emergency Medicine

## 2014-04-09 ENCOUNTER — Emergency Department (HOSPITAL_COMMUNITY)
Admission: EM | Admit: 2014-04-09 | Discharge: 2014-04-09 | Disposition: A | Discharge status: Home / Self Care | Payer: Medicaid Other | Attending: Emergency Medicine | Admitting: Emergency Medicine

## 2014-04-09 DIAGNOSIS — Z791 Long term (current) use of non-steroidal anti-inflammatories (NSAID): Secondary | ICD-10-CM | POA: Insufficient documentation

## 2014-04-09 DIAGNOSIS — Z862 Personal history of diseases of the blood and blood-forming organs and certain disorders involving the immune mechanism: Secondary | ICD-10-CM | POA: Insufficient documentation

## 2014-04-09 DIAGNOSIS — Y9289 Other specified places as the place of occurrence of the external cause: Secondary | ICD-10-CM | POA: Insufficient documentation

## 2014-04-09 DIAGNOSIS — Z880 Allergy status to penicillin: Secondary | ICD-10-CM | POA: Insufficient documentation

## 2014-04-09 DIAGNOSIS — R296 Repeated falls: Secondary | ICD-10-CM | POA: Diagnosis not present

## 2014-04-09 DIAGNOSIS — M129 Arthropathy, unspecified: Secondary | ICD-10-CM | POA: Diagnosis not present

## 2014-04-09 DIAGNOSIS — S46001A Unspecified injury of muscle(s) and tendon(s) of the rotator cuff of right shoulder, initial encounter: Secondary | ICD-10-CM

## 2014-04-09 DIAGNOSIS — Z8639 Personal history of other endocrine, nutritional and metabolic disease: Secondary | ICD-10-CM | POA: Insufficient documentation

## 2014-04-09 DIAGNOSIS — S46909A Unspecified injury of unspecified muscle, fascia and tendon at shoulder and upper arm level, unspecified arm, initial encounter: Secondary | ICD-10-CM | POA: Diagnosis not present

## 2014-04-09 DIAGNOSIS — S4980XA Other specified injuries of shoulder and upper arm, unspecified arm, initial encounter: Secondary | ICD-10-CM | POA: Insufficient documentation

## 2014-04-09 DIAGNOSIS — Y9389 Activity, other specified: Secondary | ICD-10-CM | POA: Insufficient documentation

## 2014-04-09 DIAGNOSIS — Z792 Long term (current) use of antibiotics: Secondary | ICD-10-CM | POA: Insufficient documentation

## 2014-04-09 MED ORDER — HYDROCODONE-ACETAMINOPHEN 5-325 MG PO TABS: 2.0000 | ORAL_TABLET | Freq: Four times a day (QID) | ORAL | Status: DC | PRN

## 2014-04-09 NOTE — ED Notes (Signed)
Patient states he fell last night and is complaining of right shoulder pain.

## 2014-04-09 NOTE — Discharge Instructions (Signed)
Rotator Cuff Injury Rotator cuff injury is any type of injury to the set of muscles and tendons that make up the stabilizing unit of your shoulder. This unit holds the ball of your upper arm bone (humerus) in the socket of your shoulder blade (scapula).  CAUSES Injuries to your rotator cuff most commonly come from sports or activities that cause your arm to be moved repeatedly over your head. Examples of this include throwing, weight lifting, swimming, or racquet sports. Long lasting (chronic) irritation of your rotator cuff can cause soreness and swelling (inflammation), bursitis, and eventual damage to your tendons, such as a tear (rupture). SIGNS AND SYMPTOMS Acute rotator cuff tear:  Sudden tearing sensation followed by severe pain shooting from your upper shoulder down your arm toward your elbow.  Decreased range of motion of your shoulder because of pain and muscle spasm.  Severe pain.  Inability to raise your arm out to the side because of pain and loss of muscle power (large tears). Chronic rotator cuff tear:  Pain that usually is worse at night and may interfere with sleep.  Gradual weakness and decreased shoulder motion as the pain worsens.  Decreased range of motion. Rotator cuff tendinitis:  Deep ache in your shoulder and the outside upper arm over your shoulder.  Pain that comes on gradually and becomes worse when lifting your arm to the side or turning it inward. DIAGNOSIS Rotator cuff injury is diagnosed through a medical history, physical exam, and imaging exam. The medical history helps determine the type of rotator cuff injury. Your health care provider will look at your injured shoulder, feel the injured area, and ask you to move your shoulder in different positions. X-ray exams typically are done to rule out other causes of shoulder pain, such as fractures. MRI is the exam of choice for the most severe shoulder injuries because the images show muscles and tendons.    TREATMENT  Chronic tear:  Medicine for pain, such as acetaminophen or ibuprofen.  Physical therapy and range-of-motion exercises may be helpful in maintaining shoulder function and strength.  Steroid injections into your shoulder joint.  Surgical repair of the rotator cuff if the injury does not heal with noninvasive treatment. Acute tear:  Anti-inflammatory medicines such as ibuprofen and naproxen to help reduce pain and swelling.  A sling to help support your arm and rest your rotator cuff muscles. Long-term use of a sling is not advised. It may cause significant stiffening of the shoulder joint.  Surgery may be considered within a few weeks, especially in younger, active people, to return the shoulder to full function.  Indications for surgical treatment include the following:  Age younger than 60 years.  Rotator cuff tears that are complete.  Physical therapy, rest, and anti-inflammatory medicines have been used for 6-8 weeks, with no improvement.  Employment or sporting activity that requires constant shoulder use. Tendinitis:  Anti-inflammatory medicines such as ibuprofen and naproxen to help reduce pain and swelling.  A sling to help support your arm and rest your rotator cuff muscles. Long-term use of a sling is not advised. It may cause significant stiffening of the shoulder joint.  Severe tendinitis may require:  Steroid injections into your shoulder joint.  Physical therapy.  Surgery. HOME CARE INSTRUCTIONS   Apply ice to your injury:  Put ice in a plastic bag.  Place a towel between your skin and the bag.  Leave the ice on for 20 minutes, 2-3 times a day.  If you   have a shoulder immobilizer (sling and straps), wear it until told otherwise by your health care provider.  You may want to sleep on several pillows or in a recliner at night to lessen swelling and pain.  Only take over-the-counter or prescription medicines for pain, discomfort, or fever as  directed by your health care provider.  Do simple hand squeezing exercises with a soft rubber ball to decrease hand swelling. SEEK MEDICAL CARE IF:   Your shoulder pain increases, or new pain or numbness develops in your arm, hand, or fingers.  Your hand or fingers are colder than your other hand. SEEK IMMEDIATE MEDICAL CARE IF:   Your arm, hand, or fingers are numb or tingling.  Your arm, hand, or fingers are increasingly swollen and painful, or they turn white or blue. MAKE SURE YOU:  Understand these instructions.  Will watch your condition.  Will get help right away if you are not doing well or get worse. Document Released: 07/02/2000 Document Revised: 07/10/2013 Document Reviewed: 02/14/2013 Exit[employer] Patient Information 2015 [employer]. This information is not intended to replace advice given to you by your health care provider. Make sure you discuss any questions you have with your health care provider.  

## 2014-04-09 NOTE — ED Provider Notes (Signed)
CSN: [id number]     Arrival date & time 04/09/14  1517 History   First MD Initiated Contact with Patient 04/09/14 1530     Chief Complaint  Patient presents with  . Shoulder Pain     (Consider location/radiation/quality/duration/timing/severity/associated sxs/prior Treatment) HPI Comments: Pt states that he fell last night getting out of the recliner and it now having pain in the right shoulder. No loc States that he is unable to posteriorly rotate the shoulder or lift it above the shoulder level. Has had problems with it in the past but this feels worse then normal  The history is provided by the patient. No language interpreter was used.    Past Medical History  Diagnosis Date  . Arthritis   . Gout   . Gout   . Arthritis    Past Surgical History  Procedure Laterality Date  . Cholecystectomy    . Appendectomy    . Hernia repair    . Rotator cuff surgery     Family History  Problem Relation Age of Onset  . Cancer Mother   . Cancer Father    History  Substance Use Topics  . Smoking status: Never Smoker   . Smokeless tobacco: Not on file  . Alcohol Use: No    Review of Systems  Constitutional: Negative.   Respiratory: Negative.   Cardiovascular: Negative.       Allergies  Penicillins  Home Medications   Prior to Admission medications   Medication Sig Start Date End Date Taking? Authorizing Provider  doxycycline (VIBRAMYCIN) 100 MG capsule Take 1 capsule (100 mg total) by mouth 2 (two) times daily. 03/20/14   [name], MD  HYDROcodone-acetaminophen (NORCO/VICODIN) 5-325 MG per tablet Take 2 tablets by mouth every 4 (four) hours as needed. 03/20/14   [name], MD  naproxen (NAPROSYN) 500 MG tablet Take 500 mg by mouth 2 (two) times daily with a meal.    Historical Provider, MD   BP 96/70  Pulse 74  Temp(Src) 97.7 F (36.5 C) (Oral)  Resp 16  Ht  (1.727 m)  Wt 179 lb (81.194 kg)  BMI 27.22 kg/m2  SpO2 100% Physical Exam  Nursing note and  vitals reviewed. Constitutional: He is oriented to person, place, and time. He appears well-developed and well-nourished.  Cardiovascular: Normal rate and regular rhythm.   Pulmonary/Chest: Effort normal and breath sounds normal.  Musculoskeletal:  Tender to palpation in the  Right shoulder without gross deformity or swelling  Neurological: He is alert and oriented to person, place, and time.  Grip strength equal    ED Course  Procedures (including critical care time) Labs Review Labs Reviewed - No data to display  Imaging Review Dg Shoulder Right  04/09/2014   CLINICAL DATA:  Right shoulder pain.  EXAM: RIGHT SHOULDER - 2+ VIEW  COMPARISON:  June 17, 2012.  FINDINGS: No acute fracture or dislocation is noted. Visualized ribs appear normal. Severe degenerative change of the right acromioclavicular joint is noted. Narrowing of the subacromial space is noted consistent with rotator cuff injury. Mild narrowing and osteophyte formation is seen involving the right glenohumeral joint.  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 Narrowing of the right subacromial space is noted consistent with rotator cuff injury. No acute abnormality is noted in the right shoulder.   Electronically Signed   By: Roque Lias M.D.   On: 04/09/2014 16:06     EKG Interpretation None  MDM   Final diagnoses:  Rotator cuff injury, right, initial encounter    Pt given pain medication. From chart review this is not a new injury and pt can follow up with them for continued symptoms    Teressa Lower, NP 04/09/14 1614

## 2014-04-10 ENCOUNTER — Telehealth: Payer: Self-pay | Admitting: Orthopedic Surgery

## 2014-04-10 NOTE — Telephone Encounter (Signed)
Jared Beck called asking for an appointment he was seen in the ER on 04/08/14 and patient states the ER Dr advised him to follow up with a Orthopedic Dr. He is [place] Medicaid and is a current patient of Dr. Roxy Horseman per Jared Beck at Dr. Kassie Mends office, she stated patient would need to make an appointment there first and they would then send [place] a referral if they find it necessary to do so. Patient seemed to understand and stated that he would call his PCP. Appointment Pending

## 2014-04-10 NOTE — ED Provider Notes (Signed)
Medical screening examination/treatment/procedure(s) were performed by non-physician practitioner and as supervising physician I was immediately available for consultation/collaboration.   EKG Interpretation None        Esperanza Madrazo L Nichole Neyer, MD 04/10/14 1550 

## 2014-04-24 NOTE — Telephone Encounter (Signed)
As of 04/24/14, no referral or further response received.  Patient aware we can schedule upon receipt of referral.

## 2014-04-27 ENCOUNTER — Emergency Department (HOSPITAL_COMMUNITY): Admission: EM | Admit: 2014-04-27 | Payer: Disability Insurance | Source: Home / Self Care

## 2014-07-26 DIAGNOSIS — Z8739 Personal history of other diseases of the musculoskeletal system and connective tissue: Secondary | ICD-10-CM | POA: Insufficient documentation

## 2014-07-26 DIAGNOSIS — M19011 Primary osteoarthritis, right shoulder: Secondary | ICD-10-CM | POA: Insufficient documentation

## 2014-08-06 ENCOUNTER — Encounter (HOSPITAL_COMMUNITY): Payer: Self-pay | Admitting: Emergency Medicine

## 2014-08-06 ENCOUNTER — Emergency Department (HOSPITAL_COMMUNITY)
Admission: EM | Admit: 2014-08-06 | Discharge: 2014-08-06 | Disposition: A | Discharge status: Home / Self Care | Payer: Medicaid Other | Attending: Emergency Medicine | Admitting: Emergency Medicine

## 2014-08-06 DIAGNOSIS — M25531 Pain in right wrist: Secondary | ICD-10-CM

## 2014-08-06 DIAGNOSIS — R2231 Localized swelling, mass and lump, right upper limb: Secondary | ICD-10-CM | POA: Diagnosis present

## 2014-08-06 DIAGNOSIS — Z880 Allergy status to penicillin: Secondary | ICD-10-CM | POA: Diagnosis not present

## 2014-08-06 DIAGNOSIS — M1990 Unspecified osteoarthritis, unspecified site: Secondary | ICD-10-CM | POA: Insufficient documentation

## 2014-08-06 DIAGNOSIS — Z791 Long term (current) use of non-steroidal anti-inflammatories (NSAID): Secondary | ICD-10-CM | POA: Diagnosis not present

## 2014-08-06 DIAGNOSIS — Z792 Long term (current) use of antibiotics: Secondary | ICD-10-CM | POA: Diagnosis not present

## 2014-08-06 HISTORY — DX: Other intervertebral disc degeneration, lumbar region without mention of lumbar back pain or lower extremity pain: M51.369

## 2014-08-06 HISTORY — DX: Other intervertebral disc degeneration, lumbar region: M51.36

## 2014-08-06 MED ORDER — IBUPROFEN 600 MG PO TABS: 600.0000 mg | ORAL_TABLET | Freq: Four times a day (QID) | ORAL | Status: DC | PRN

## 2014-08-06 MED ORDER — PREDNISONE 20 MG PO TABS
40.0000 mg | ORAL_TABLET | Freq: Once | ORAL | Status: AC
  Administered 2014-08-06: 40 mg via ORAL
  Filled 2014-08-06: qty 2

## 2014-08-06 MED ORDER — OXYCODONE-ACETAMINOPHEN 5-325 MG PO TABS: 1.0000 | ORAL_TABLET | ORAL | Status: DC | PRN

## 2014-08-06 MED ORDER — COLCHICINE 0.6 MG PO TABS: 1.2000 mg | ORAL_TABLET | Freq: Two times a day (BID) | ORAL | Status: DC

## 2014-08-06 MED ORDER — COLCHICINE 0.6 MG PO TABS
1.2000 mg | ORAL_TABLET | Freq: Once | ORAL | Status: AC
  Administered 2014-08-06: 1.2 mg via ORAL
  Filled 2014-08-06: qty 2

## 2014-08-06 MED ORDER — OXYCODONE-ACETAMINOPHEN 5-325 MG PO TABS
2.0000 | ORAL_TABLET | Freq: Once | ORAL | Status: AC
  Administered 2014-08-06: 2 via ORAL
  Filled 2014-08-06: qty 2

## 2014-08-06 NOTE — ED Provider Notes (Signed)
CSN: [id number]     Arrival date & time 08/06/14  1136 History  This chart was scribed for Raeford RazorStephen Amiliah Campisi, MD by Ronney LionSuzanne Le, ED Scribe. This patient was seen in room APA12/APA12 and the patient's care was started at 12:19 PM.    Chief Complaint  Patient presents with  . Joint Swelling   The history is provided by the patient. No language interpreter was used.     HPI Comments: Jared Beck is a 54 y.o. male with a history of gout and RA who presents to the Emergency Department complaining of right wrist swelling and pain that began yesterday afternoon and worsened overnight. He denies falls, trauma, or injury. Patient has had gout in his wrist before; today's symptoms are the same. He complains of associated right hand and forearm pain, as well as subjective fever. Patient has taken Tylenol, which has been ineffective. He denies any recent changes to medications. Patient has known allergies to Penicillin and Vicodin. His girlfriend will drive him home today.  Past Medical History  Diagnosis Date  . Arthritis   . Gout   . Gout   . Arthritis   . DDD (degenerative disc disease), lumbar    Past Surgical History  Procedure Laterality Date  . Cholecystectomy    . Appendectomy    . Hernia repair    . Rotator cuff surgery     Family History  Problem Relation Age of Onset  . Cancer Mother   . Cancer Father    History  Substance Use Topics  . Smoking status: Never Smoker   . Smokeless tobacco: Never Used  . Alcohol Use: No    Review of Systems  Musculoskeletal: Positive for joint swelling and arthralgias.  All other systems reviewed and are negative.     Allergies  Penicillins and Vicodin  Home Medications   Prior to Admission medications   Medication Sig Start Date End Date Taking? Authorizing Provider  allopurinol (ZYLOPRIM) 100 MG tablet Take 100 mg by mouth daily. 03/26/14 03/26/15  Historical Provider, MD  colchicine 0.6 MG tablet Take 0.6 mg by mouth daily. 03/26/14  03/26/15  Historical Provider, MD  doxycycline (VIBRAMYCIN) 100 MG capsule Take 1 capsule (100 mg total) by mouth 2 (two) times daily. 03/20/14   Glynn OctaveStephen Rancour, MD  HYDROcodone-acetaminophen (NORCO/VICODIN) 5-325 MG per tablet Take 2 tablets by mouth every 4 (four) hours as needed. 03/20/14   Glynn OctaveStephen Rancour, MD  HYDROcodone-acetaminophen (NORCO/VICODIN) 5-325 MG per tablet Take 2 tablets by mouth every 6 (six) hours as needed. 04/09/14   Teressa LowerVrinda Pickering, NP  naproxen (NAPROSYN) 500 MG tablet Take 500 mg by mouth 2 (two) times daily with a meal.    Historical Provider, MD   BP 116/77 mmHg  Pulse 85  Temp(Src) 97.8 F (36.6 C) (Oral)  Resp 18  Ht 5\' 8"  (1.727 m)  Wt 169 lb (76.658 kg)  BMI 25.70 kg/m2  SpO2 100% Physical Exam  Constitutional: He appears well-developed and well-nourished. No distress.  HENT:  Head: Normocephalic and atraumatic.  Eyes: Conjunctivae are normal. Right eye exhibits no discharge. Left eye exhibits no discharge.  Neck: Neck supple.  Cardiovascular: Normal rate, regular rhythm and normal heart sounds.  Exam reveals no gallop and no friction rub.   No murmur heard. Pulmonary/Chest: Effort normal and breath sounds normal. No respiratory distress.  Abdominal: Soft. He exhibits no distension. There is no tenderness.  Musculoskeletal: He exhibits edema (Swelling to dorsal aspect of right wrist.) and tenderness (Severe  right wrist pain with ROM.).  ROM is limited by pain. Tophus to left PP finger.   Neurological: He is alert.  Skin: Skin is warm and dry.  Psychiatric: He has a normal mood and affect. His behavior is normal. Thought content normal.  Nursing note and vitals reviewed.   ED Course  Procedures (including critical care time)  DIAGNOSTIC STUDIES: Oxygen Saturation is 100% on room air, normal by my interpretation.    COORDINATION OF CARE: 12:22 PM - Discussed treatment plan with pt at bedside which includes colchicine, NSAIDs, and Percocet and pt agreed  to plan. Patient advised to return to ED if he notices no improvement.   Labs Review Labs Reviewed - No data to display  Imaging Review No results found.   EKG Interpretation None      MDM   Final diagnoses:  Wrist pain, right   53yM with atraumatic R wrist pain. Likely gout. Long standing hx of same. Similar current symptoms. Afebrile. Plan presumptive tx of gout at this time. Return precautions discussed.   I personally preformed the services scribed in my presence. The recorded information has been reviewed is accurate. Raeford Razor, MD.     Raeford Razor, MD 08/07/14 [phone]

## 2014-08-06 NOTE — ED Notes (Signed)
Patient c/o swelling in right wrist and hand. Per patient started yesterday and has progressively gotten worse. Denies any injury. Patient reports hx of gout and arthritis but states the pain is different, "constant." Per patient took tylenol with no relief. Patient unable to make fist. Radial pulse present, strong. Capillary refill WNL.

## 2014-12-02 NOTE — Pre-Procedure Instructions (Addendum)
Jared Beck  12/02/2014   Your procedure is scheduled on:  Friday, May 27  [facility] at 5:30 AM.  Call this number if you have problems the morning of surgery: [phone]               Any questions prior to surgery call pre-admission [phone] (Monday-Friday 8:00am-4:00pm)   Remember:   Do not eat food or drink liquids after midnight on Thursday, May 26                  Take these medicines the morning of surgery with A SIP OF WATER: allopurinol, abilify, colchicine, prozac,               STOP all herbel meds, nsaids (aleve,naproxen,advil,ibuprofen) 5 days prior to surgery starting 5.22.16 including vitamins, aspirin   Do not wear jewelry, make-up or nail polish.  Do not wear lotions, powders, or perfumes. You may not wear deodorant.  Do not shave 48 hours prior to surgery. Men may shave face and neck.  Do not bring valuables to the hospital.  [facility] is not responsible for any belongings or valuables.               Contacts, dentures or bridgework may not be worn into surgery.  Leave suitcase in the car. After surgery it may be brought to your room.  For patients admitted to the hospital, discharge time is determined by your  treatment team.               Patients discharged the day of surgery will not be allowed to drive home.  Name and phone number of your driver:   Special Instructions:  Special Instructions: [place] - Preparing for Surgery  Before surgery, you can play an important role.  Because skin is not sterile, your skin needs to be as free of germs as possible.  You can reduce the number of germs on you skin by washing with CHG (chlorahexidine gluconate) soap before surgery.  CHG is an antiseptic cleaner which kills germs and bonds with the skin to continue killing germs even after washing.  Please DO NOT use if you have an allergy to CHG or antibacterial soaps.  If your skin becomes reddened/irritated stop using the  CHG and inform your nurse when you arrive at Short Stay.  Do not shave (including legs and underarms) for at least 48 hours prior to the first CHG shower.  You may shave your face.  Please follow these instructions carefully:   1.  Shower with CHG Soap the night before surgery and the morning of Surgery.  2.  If you choose to wash your hair, wash your hair first as usual with your normal shampoo.  3.  After you shampoo, rinse your hair and body thoroughly to remove the Shampoo.  4.  Use CHG as you would any other liquid soap.  You can apply chg directly  to the skin and wash gently with scrungie or a clean washcloth.  5.  Apply the CHG Soap to your body ONLY FROM THE NECK DOWN.  Do not use on open wounds or open sores.  Avoid contact with your eyes ears, mouth and genitals (private parts).  Wash genitals (private parts)       with your normal soap.  6.  Wash thoroughly, paying special attention to the area where your surgery will be performed.  7.  Thoroughly rinse your body  with warm water from the neck down.  8.  DO NOT shower/wash with your normal soap after using and rinsing off the CHG Soap.  9.  Pat yourself dry with a clean towel.            10.  Wear clean pajamas.            11.  Place clean sheets on your bed the night of your first shower and do not sleep with pets.  Day of Surgery  Do not apply any lotions/deodorants the morning of surgery.  Please wear clean clothes to the hospital/surgery center.   Please read over the following fact sheets that you were given: Pain Booklet, Coughing and Deep Breathing, MRSA Information and Surgical Site Infection Prevention,

## 2014-12-03 ENCOUNTER — Encounter (HOSPITAL_COMMUNITY): Payer: Self-pay

## 2014-12-03 ENCOUNTER — Encounter (HOSPITAL_COMMUNITY)
Admission: RE | Admit: 2014-12-03 | Discharge: 2014-12-03 | Disposition: A | Discharge status: Home / Self Care | Payer: Medicaid Other | Source: Ambulatory Visit | Attending: Orthopedic Surgery | Admitting: Orthopedic Surgery

## 2014-12-03 DIAGNOSIS — M7592 Shoulder lesion, unspecified, left shoulder: Secondary | ICD-10-CM | POA: Insufficient documentation

## 2014-12-03 DIAGNOSIS — Z01812 Encounter for preprocedural laboratory examination: Secondary | ICD-10-CM | POA: Insufficient documentation

## 2014-12-03 HISTORY — DX: Adverse effect of unspecified anesthetic, initial encounter: T41.45XA

## 2014-12-03 HISTORY — DX: Major depressive disorder, single episode, unspecified: F32.9

## 2014-12-03 HISTORY — DX: Other specified postprocedural states: R11.2

## 2014-12-03 HISTORY — DX: Nausea with vomiting, unspecified: Z98.890

## 2014-12-03 HISTORY — DX: Anxiety disorder, unspecified: F41.9

## 2014-12-03 HISTORY — DX: Depression, unspecified: F32.A

## 2014-12-03 HISTORY — DX: Gastro-esophageal reflux disease without esophagitis: K21.9

## 2014-12-03 HISTORY — DX: Other complications of anesthesia, initial encounter: T88.59XA

## 2014-12-03 LAB — COMPREHENSIVE METABOLIC PANEL
ALK PHOS: 64 U/L (ref 38–126)
ALT: 20 U/L (ref 17–63)
ANION GAP: 8 (ref 5–15)
AST: 31 U/L (ref 15–41)
Albumin: 3.8 g/dL (ref 3.5–5.0)
BILIRUBIN TOTAL: 2.4 mg/dL — AB (ref 0.3–1.2)
BUN: 6 mg/dL (ref 6–20)
CHLORIDE: 102 mmol/L (ref 101–111)
CO2: 25 mmol/L (ref 22–32)
CREATININE: 0.99 mg/dL (ref 0.61–1.24)
Calcium: 9 mg/dL (ref 8.9–10.3)
GFR calc non Af Amer: >60 mL/min (ref >60)
GLUCOSE: 88 mg/dL (ref 65–99)
POTASSIUM: 3.5 mmol/L (ref 3.5–5.1)
Sodium: 135 mmol/L (ref 135–145)
Total Protein: 6.8 g/dL (ref 6.5–8.1)

## 2014-12-03 LAB — CBC
HCT: 41.1 % (ref 39.0–52.0)
Hemoglobin: 14.3 g/dL (ref 13.0–17.0)
MCH: 30.6 pg (ref 26.0–34.0)
MCHC: 34.8 g/dL (ref 30.0–36.0)
MCV: 88 fL (ref 78.0–100.0)
PLATELETS: 208 10*3/uL (ref 150–400)
RBC: 4.67 MIL/uL (ref 4.22–5.81)
RDW: 12.5 % (ref 11.5–15.5)
WBC: 5.3 10*3/uL (ref 4.0–10.5)

## 2014-12-03 LAB — SURGICAL PCR SCREEN
MRSA, PCR: NEGATIVE
STAPHYLOCOCCUS AUREUS: NEGATIVE

## 2014-12-03 NOTE — H&P (Signed)
Jared Beck is an 54 y.o. male.    Chief Complaint: left shoulder pain  HPI: Pt is a 54 y.o. male complaining of left shoulder pain for multiple years. Pain had continually increased since the beginning. X-rays in the clinic show end-stage arthritic changes of the left shoulder. Pt has tried various conservative treatments which have failed to alleviate their symptoms, including injections and therapy. Various options are discussed with the patient. Risks, benefits and expectations were discussed with the patient. Patient understand the risks, benefits and expectations and wishes to proceed with surgery.   PCP:  No PCP Per Patient  D/C Plans: Home  PMH: Past Medical History  Diagnosis Date  . Arthritis   . Gout   . Gout   . Arthritis   . DDD (degenerative disc disease), lumbar   . Complication of anesthesia   . PONV (postoperative nausea and vomiting)     nausea  . Depression   . Anxiety   . GERD (gastroesophageal reflux disease)     occ  . Hepatitis     :dating someone with hepatitis C did not know until broke up 6 months ago"    PSH: Past Surgical History  Procedure Laterality Date  . Rotator cuff surgery Left 16  . Hernia repair  89    umbilical, groin as child  . Cholecystectomy  12  . Appendectomy  56    Social History:  reports that he has never smoked. He has never used smokeless tobacco. He reports that he does not drink alcohol or use illicit drugs.  Allergies:  Allergies  Allergen Reactions  . Penicillins Swelling    Swelling of tongue  . Vicodin [Hydrocodone-Acetaminophen] Itching    Medications: No current facility-administered medications for this encounter.   Current Outpatient Prescriptions  Medication Sig Dispense Refill  . allopurinol (ZYLOPRIM) 100 MG tablet Take 100 mg by mouth daily.    . ARIPiprazole (ABILIFY) 5 MG tablet Take 5 mg by mouth daily.    . colchicine 0.6 MG tablet Take 0.6 mg by mouth daily.    Marland Kitchen FLUoxetine (PROZAC) 20 MG  tablet Take 60 mg by mouth daily.    . colchicine 0.6 MG tablet Take 2 tablets (1.2 mg total) by mouth 2 (two) times daily. (Patient not taking: Reported on 12/02/2014) 10 tablet 0  . doxycycline (VIBRAMYCIN) 100 MG capsule Take 1 capsule (100 mg total) by mouth 2 (two) times daily. (Patient not taking: Reported on 12/02/2014) 20 capsule 0  . HYDROcodone-acetaminophen (NORCO/VICODIN) 5-325 MG per tablet Take 2 tablets by mouth every 4 (four) hours as needed. (Patient not taking: Reported on 12/02/2014) 10 tablet 0  . HYDROcodone-acetaminophen (NORCO/VICODIN) 5-325 MG per tablet Take 2 tablets by mouth every 6 (six) hours as needed. (Patient not taking: Reported on 12/02/2014) 10 tablet 0  . ibuprofen (ADVIL,MOTRIN) 200 MG tablet Take 400 mg by mouth every 5 (five) hours as needed.    Marland Kitchen ibuprofen (ADVIL,MOTRIN) 600 MG tablet Take 1 tablet (600 mg total) by mouth every 6 (six) hours as needed. (Patient not taking: Reported on 12/02/2014) 30 tablet 0  . oxyCODONE-acetaminophen (PERCOCET/ROXICET) 5-325 MG per tablet Take 1-2 tablets by mouth every 4 (four) hours as needed for severe pain. (Patient not taking: Reported on 12/02/2014) 12 tablet 0    Results for orders placed or performed during the hospital encounter of 12/03/14 (from the past 48 hour(s))  Surgical pcr screen     Status: None   Collection Time: 12/03/14  9:32 AM  Result Value Ref Range   MRSA, PCR NEGATIVE NEGATIVE   Staphylococcus aureus NEGATIVE NEGATIVE    Comment:        The Xpert SA Assay (FDA approved for NASAL specimens in patients over 21 years of age), is one component of a comprehensive surveillance program.  Test performance has been validated by [facility] for patients greater than or equal to 6 year old. It is not intended to diagnose infection nor to guide or monitor treatment.   Comprehensive metabolic panel     Status: Abnormal   Collection Time: 12/03/14  9:37 AM  Result Value Ref Range   Sodium 135 135 - 145  mmol/L   Potassium 3.5 3.5 - 5.1 mmol/L   Chloride 102 101 - 111 mmol/L   CO2 25 22 - 32 mmol/L   Glucose, Bld 88 65 - 99 mg/dL   BUN 6 6 - 20 mg/dL   Creatinine, Ser 0.99 0.61 - 1.24 mg/dL   Calcium 9.0 8.9 - 10.3 mg/dL   Total Protein 6.8 6.5 - 8.1 g/dL   Albumin 3.8 3.5 - 5.0 g/dL   AST 31 15 - 41 U/L   ALT 20 17 - 63 U/L   Alkaline Phosphatase 64 38 - 126 U/L   Total Bilirubin 2.4 (H) 0.3 - 1.2 mg/dL   GFR calc non Af Amer >60 >60 mL/min   GFR calc Af Amer >60 >60 mL/min    Comment: (NOTE) The eGFR has been calculated using the CKD EPI equation. This calculation has not been validated in all clinical situations. eGFR's persistently <60 mL/min signify possible Chronic Kidney Disease.    Anion gap 8 5 - 15  CBC     Status: None   Collection Time: 12/03/14  9:37 AM  Result Value Ref Range   WBC 5.3 4.0 - 10.5 K/uL   RBC 4.67 4.22 - 5.81 MIL/uL   Hemoglobin 14.3 13.0 - 17.0 g/dL   HCT 41.1 39.0 - 52.0 %   MCV 88.0 78.0 - 100.0 fL   MCH 30.6 26.0 - 34.0 pg   MCHC 34.8 30.0 - 36.0 g/dL   RDW 12.5 11.5 - 15.5 %   Platelets 208 150 - 400 K/uL   No results found.  ROS: Pain with rom of the left upper extremity  Physical Exam:  Alert and oriented 54 y.o. male in no acute distress Cranial nerves 2-12 intact Cervical spine: full rom with no tenderness, nv intact distally Chest: active breath sounds bilaterally, no wheeze rhonchi or rales Heart: regular rate and rhythm, no murmur Abd: non tender non distended with active bowel sounds Hip is stable with rom  Left shoulder with moderate limitation with rom due to pain and weakness nv intact distally No rashes or edema  Assessment/Plan Assessment: left shoulder rotator cuff insufficiency  Plan: Patient will undergo a left reverse total shoulder by Dr. Veverly Fells at [facility]. Risks benefits and expectations were discussed with the patient. Patient understand risks, benefits and expectations and wishes to proceed.

## 2014-12-12 MED ORDER — CLINDAMYCIN PHOSPHATE 900 MG/50ML IV SOLN
900.0000 mg | INTRAVENOUS | Status: AC
  Administered 2014-12-13: 900 mg via INTRAVENOUS
  Filled 2014-12-12: qty 50

## 2014-12-13 ENCOUNTER — Inpatient Hospital Stay (HOSPITAL_COMMUNITY): Payer: Medicaid Other | Admitting: Vascular Surgery

## 2014-12-13 ENCOUNTER — Inpatient Hospital Stay (HOSPITAL_COMMUNITY): Payer: Medicaid Other

## 2014-12-13 ENCOUNTER — Inpatient Hospital Stay (HOSPITAL_COMMUNITY)
Admission: RE | Admit: 2014-12-13 | Discharge: 2014-12-14 | DRG: 483 | Disposition: A | Discharge status: Home / Self Care | Payer: Medicaid Other | Source: Ambulatory Visit | Attending: Orthopedic Surgery | Admitting: Orthopedic Surgery

## 2014-12-13 ENCOUNTER — Inpatient Hospital Stay (HOSPITAL_COMMUNITY): Payer: Medicaid Other | Admitting: Anesthesiology

## 2014-12-13 ENCOUNTER — Encounter (HOSPITAL_COMMUNITY)
Admission: RE | Disposition: A | Discharge status: Home / Self Care | Payer: Self-pay | Source: Ambulatory Visit | Attending: Orthopedic Surgery

## 2014-12-13 ENCOUNTER — Encounter (HOSPITAL_COMMUNITY): Payer: Self-pay | Admitting: General Practice

## 2014-12-13 DIAGNOSIS — F329 Major depressive disorder, single episode, unspecified: Secondary | ICD-10-CM | POA: Diagnosis present

## 2014-12-13 DIAGNOSIS — M109 Gout, unspecified: Secondary | ICD-10-CM | POA: Diagnosis present

## 2014-12-13 DIAGNOSIS — Z880 Allergy status to penicillin: Secondary | ICD-10-CM | POA: Diagnosis not present

## 2014-12-13 DIAGNOSIS — M5136 Other intervertebral disc degeneration, lumbar region: Secondary | ICD-10-CM | POA: Diagnosis present

## 2014-12-13 DIAGNOSIS — F419 Anxiety disorder, unspecified: Secondary | ICD-10-CM | POA: Diagnosis present

## 2014-12-13 DIAGNOSIS — Z9049 Acquired absence of other specified parts of digestive tract: Secondary | ICD-10-CM | POA: Diagnosis present

## 2014-12-13 DIAGNOSIS — M19012 Primary osteoarthritis, left shoulder: Secondary | ICD-10-CM | POA: Diagnosis present

## 2014-12-13 DIAGNOSIS — B1920 Unspecified viral hepatitis C without hepatic coma: Secondary | ICD-10-CM | POA: Diagnosis present

## 2014-12-13 DIAGNOSIS — K219 Gastro-esophageal reflux disease without esophagitis: Secondary | ICD-10-CM | POA: Diagnosis present

## 2014-12-13 DIAGNOSIS — Z96619 Presence of unspecified artificial shoulder joint: Secondary | ICD-10-CM | POA: Insufficient documentation

## 2014-12-13 DIAGNOSIS — Z885 Allergy status to narcotic agent status: Secondary | ICD-10-CM

## 2014-12-13 DIAGNOSIS — Z96612 Presence of left artificial shoulder joint: Secondary | ICD-10-CM

## 2014-12-13 HISTORY — PX: REVERSE SHOULDER ARTHROPLASTY: SHX5054

## 2014-12-13 SURGERY — ARTHROPLASTY, SHOULDER, TOTAL, REVERSE
Anesthesia: General | Site: Shoulder | Laterality: Left

## 2014-12-13 MED ORDER — FLUOXETINE HCL 20 MG PO CAPS
60.0000 mg | ORAL_CAPSULE | Freq: Every day | ORAL | Status: DC
  Administered 2014-12-13 – 2014-12-14 (×2): 60 mg via ORAL
  Filled 2014-12-13 (×2): qty 3

## 2014-12-13 MED ORDER — DEXAMETHASONE SODIUM PHOSPHATE 10 MG/ML IJ SOLN
INTRAMUSCULAR | Status: DC | PRN
  Administered 2014-12-13: 10 mg via INTRAVENOUS

## 2014-12-13 MED ORDER — ACETAMINOPHEN 650 MG RE SUPP: 650.0000 mg | Freq: Four times a day (QID) | RECTAL | Status: DC | PRN

## 2014-12-13 MED ORDER — MIDAZOLAM HCL 5 MG/5ML IJ SOLN
INTRAMUSCULAR | Status: DC | PRN
  Administered 2014-12-13: 2 mg via INTRAVENOUS

## 2014-12-13 MED ORDER — SODIUM CHLORIDE 0.9 % IV SOLN
10.0000 mg | INTRAVENOUS | Status: DC | PRN
  Administered 2014-12-13: 50 ug/min via INTRAVENOUS

## 2014-12-13 MED ORDER — ONDANSETRON HCL 4 MG/2ML IJ SOLN
INTRAMUSCULAR | Status: AC
  Filled 2014-12-13: qty 2

## 2014-12-13 MED ORDER — SCOPOLAMINE 1 MG/3DAYS TD PT72
1.0000 | MEDICATED_PATCH | TRANSDERMAL | Status: DC
  Administered 2014-12-13: 1 via TRANSDERMAL
  Filled 2014-12-13: qty 1

## 2014-12-13 MED ORDER — EPHEDRINE SULFATE 50 MG/ML IJ SOLN
INTRAMUSCULAR | Status: DC | PRN
  Administered 2014-12-13: 10 mg via INTRAVENOUS

## 2014-12-13 MED ORDER — PHENYLEPHRINE 40 MCG/ML (10ML) SYRINGE FOR IV PUSH (FOR BLOOD PRESSURE SUPPORT)
PREFILLED_SYRINGE | INTRAVENOUS | Status: AC
  Filled 2014-12-13: qty 30

## 2014-12-13 MED ORDER — LACTATED RINGERS IV SOLN
INTRAVENOUS | Status: DC | PRN
  Administered 2014-12-13 (×2): via INTRAVENOUS

## 2014-12-13 MED ORDER — FENTANYL CITRATE (PF) 250 MCG/5ML IJ SOLN
INTRAMUSCULAR | Status: AC
  Filled 2014-12-13: qty 5

## 2014-12-13 MED ORDER — NEOSTIGMINE METHYLSULFATE 10 MG/10ML IV SOLN
INTRAVENOUS | Status: DC | PRN
  Administered 2014-12-13: 4 mg via INTRAVENOUS

## 2014-12-13 MED ORDER — 0.9 % SODIUM CHLORIDE (POUR BTL) OPTIME
TOPICAL | Status: DC | PRN
  Administered 2014-12-13: 1000 mL

## 2014-12-13 MED ORDER — PHENOL 1.4 % MT LIQD: 1.0000 | OROMUCOSAL | Status: DC | PRN

## 2014-12-13 MED ORDER — METOCLOPRAMIDE HCL 5 MG PO TABS
5.0000 mg | ORAL_TABLET | Freq: Three times a day (TID) | ORAL | Status: DC | PRN
Start: 2014-12-13 — End: 2014-12-14

## 2014-12-13 MED ORDER — CHLORHEXIDINE GLUCONATE 4 % EX LIQD: 60.0000 mL | Freq: Once | CUTANEOUS | Status: DC

## 2014-12-13 MED ORDER — HYDROMORPHONE HCL 1 MG/ML IJ SOLN
1.0000 mg | INTRAMUSCULAR | Status: DC | PRN
  Administered 2014-12-13 – 2014-12-14 (×2): 1 mg via INTRAVENOUS
  Filled 2014-12-13 (×2): qty 1

## 2014-12-13 MED ORDER — CLINDAMYCIN PHOSPHATE 600 MG/50ML IV SOLN
600.0000 mg | Freq: Four times a day (QID) | INTRAVENOUS | Status: AC
  Administered 2014-12-13 – 2014-12-14 (×3): 600 mg via INTRAVENOUS
  Filled 2014-12-13 (×3): qty 50

## 2014-12-13 MED ORDER — MIDAZOLAM HCL 2 MG/2ML IJ SOLN
INTRAMUSCULAR | Status: AC
  Filled 2014-12-13: qty 2

## 2014-12-13 MED ORDER — GLYCOPYRROLATE 0.2 MG/ML IJ SOLN
INTRAMUSCULAR | Status: AC
  Filled 2014-12-13: qty 4

## 2014-12-13 MED ORDER — OXYCODONE-ACETAMINOPHEN 5-325 MG PO TABS: 1.0000 | ORAL_TABLET | ORAL | Status: DC | PRN

## 2014-12-13 MED ORDER — SODIUM CHLORIDE 0.9 % IJ SOLN
INTRAMUSCULAR | Status: AC
  Filled 2014-12-13: qty 20

## 2014-12-13 MED ORDER — BUPIVACAINE-EPINEPHRINE (PF) 0.25% -1:200000 IJ SOLN
INTRAMUSCULAR | Status: AC
  Filled 2014-12-13: qty 30

## 2014-12-13 MED ORDER — ONDANSETRON HCL 4 MG/2ML IJ SOLN: 4.0000 mg | Freq: Four times a day (QID) | INTRAMUSCULAR | Status: DC | PRN

## 2014-12-13 MED ORDER — PROPOFOL 10 MG/ML IV BOLUS
INTRAVENOUS | Status: AC
  Filled 2014-12-13: qty 20

## 2014-12-13 MED ORDER — ONDANSETRON HCL 4 MG PO TABS: 4.0000 mg | ORAL_TABLET | Freq: Four times a day (QID) | ORAL | Status: DC | PRN

## 2014-12-13 MED ORDER — METHOCARBAMOL 500 MG PO TABS
500.0000 mg | ORAL_TABLET | Freq: Four times a day (QID) | ORAL | Status: DC | PRN
  Administered 2014-12-13 – 2014-12-14 (×2): 500 mg via ORAL
  Filled 2014-12-13 (×2): qty 1

## 2014-12-13 MED ORDER — POLYETHYLENE GLYCOL 3350 17 G PO PACK
17.0000 g | PACK | Freq: Every day | ORAL | Status: DC | PRN
  Administered 2014-12-14: 17 g via ORAL
  Filled 2014-12-13: qty 1

## 2014-12-13 MED ORDER — COLCHICINE 0.6 MG PO TABS
0.6000 mg | ORAL_TABLET | Freq: Every day | ORAL | Status: DC
  Administered 2014-12-13 – 2014-12-14 (×2): 0.6 mg via ORAL
  Filled 2014-12-13 (×2): qty 1

## 2014-12-13 MED ORDER — PROPOFOL 10 MG/ML IV BOLUS
INTRAVENOUS | Status: DC | PRN
  Administered 2014-12-13: 150 mg via INTRAVENOUS

## 2014-12-13 MED ORDER — OXYCODONE-ACETAMINOPHEN 5-325 MG PO TABS
1.0000 | ORAL_TABLET | ORAL | Status: DC | PRN
  Administered 2014-12-13 – 2014-12-14 (×3): 2 via ORAL
  Filled 2014-12-13 (×3): qty 2

## 2014-12-13 MED ORDER — PROMETHAZINE HCL 25 MG/ML IJ SOLN: 6.2500 mg | INTRAMUSCULAR | Status: DC | PRN

## 2014-12-13 MED ORDER — ACETAMINOPHEN 325 MG PO TABS
650.0000 mg | ORAL_TABLET | Freq: Four times a day (QID) | ORAL | Status: DC | PRN
  Administered 2014-12-13: 650 mg via ORAL
  Filled 2014-12-13: qty 2

## 2014-12-13 MED ORDER — EPHEDRINE SULFATE 50 MG/ML IJ SOLN
INTRAMUSCULAR | Status: AC
  Filled 2014-12-13: qty 3

## 2014-12-13 MED ORDER — METHOCARBAMOL 1000 MG/10ML IJ SOLN
500.0000 mg | Freq: Four times a day (QID) | INTRAVENOUS | Status: DC | PRN
  Filled 2014-12-13: qty 5

## 2014-12-13 MED ORDER — ALLOPURINOL 100 MG PO TABS
100.0000 mg | ORAL_TABLET | Freq: Every day | ORAL | Status: DC
  Administered 2014-12-13 – 2014-12-14 (×2): 100 mg via ORAL
  Filled 2014-12-13 (×2): qty 1

## 2014-12-13 MED ORDER — SUCCINYLCHOLINE CHLORIDE 20 MG/ML IJ SOLN
INTRAMUSCULAR | Status: AC
  Filled 2014-12-13: qty 1

## 2014-12-13 MED ORDER — EPHEDRINE SULFATE 50 MG/ML IJ SOLN
INTRAMUSCULAR | Status: AC
  Filled 2014-12-13: qty 1

## 2014-12-13 MED ORDER — HYDROMORPHONE HCL 1 MG/ML IJ SOLN: 0.2500 mg | INTRAMUSCULAR | Status: DC | PRN

## 2014-12-13 MED ORDER — ARIPIPRAZOLE 5 MG PO TABS
5.0000 mg | ORAL_TABLET | Freq: Every day | ORAL | Status: DC
  Administered 2014-12-13 – 2014-12-14 (×2): 5 mg via ORAL
  Filled 2014-12-13 (×2): qty 1

## 2014-12-13 MED ORDER — MENTHOL 3 MG MT LOZG: 1.0000 | LOZENGE | OROMUCOSAL | Status: DC | PRN

## 2014-12-13 MED ORDER — LIDOCAINE HCL (CARDIAC) 20 MG/ML IV SOLN
INTRAVENOUS | Status: AC
  Filled 2014-12-13: qty 10

## 2014-12-13 MED ORDER — FENTANYL CITRATE (PF) 100 MCG/2ML IJ SOLN
INTRAMUSCULAR | Status: DC | PRN
  Administered 2014-12-13: 100 ug via INTRAVENOUS
  Administered 2014-12-13: 50 ug via INTRAVENOUS

## 2014-12-13 MED ORDER — SODIUM CHLORIDE 0.9 % IV SOLN
INTRAVENOUS | Status: DC
  Administered 2014-12-14: 03:00:00 via INTRAVENOUS

## 2014-12-13 MED ORDER — FLUOXETINE HCL 20 MG PO TABS
60.0000 mg | ORAL_TABLET | Freq: Every day | ORAL | Status: DC
  Filled 2014-12-13: qty 3

## 2014-12-13 MED ORDER — GLYCOPYRROLATE 0.2 MG/ML IJ SOLN
INTRAMUSCULAR | Status: DC | PRN
  Administered 2014-12-13: 0.2 mg via INTRAVENOUS
  Administered 2014-12-13: .6 mg via INTRAVENOUS

## 2014-12-13 MED ORDER — SODIUM CHLORIDE 0.9 % IJ SOLN
INTRAMUSCULAR | Status: AC
Start: 2014-12-13 — End: 2014-12-13
  Filled 2014-12-13: qty 10

## 2014-12-13 MED ORDER — COLCHICINE 0.6 MG PO TABS: 1.2000 mg | ORAL_TABLET | Freq: Two times a day (BID) | ORAL | Status: DC

## 2014-12-13 MED ORDER — ONDANSETRON HCL 4 MG/2ML IJ SOLN
INTRAMUSCULAR | Status: DC | PRN
  Administered 2014-12-13: 4 mg via INTRAVENOUS

## 2014-12-13 MED ORDER — BUPIVACAINE-EPINEPHRINE (PF) 0.5% -1:200000 IJ SOLN
INTRAMUSCULAR | Status: DC | PRN
  Administered 2014-12-13: 30 mL via PERINEURAL

## 2014-12-13 MED ORDER — METOCLOPRAMIDE HCL 5 MG/ML IJ SOLN: 5.0000 mg | Freq: Three times a day (TID) | INTRAMUSCULAR | Status: DC | PRN

## 2014-12-13 MED ORDER — DOXYCYCLINE HYCLATE 100 MG PO CAPS: 100.0000 mg | ORAL_CAPSULE | Freq: Two times a day (BID) | ORAL | Status: DC

## 2014-12-13 MED ORDER — ROCURONIUM BROMIDE 100 MG/10ML IV SOLN
INTRAVENOUS | Status: DC | PRN
  Administered 2014-12-13: 50 mg via INTRAVENOUS

## 2014-12-13 MED ORDER — METHOCARBAMOL 500 MG PO TABS: 500.0000 mg | ORAL_TABLET | Freq: Three times a day (TID) | ORAL | Status: DC | PRN

## 2014-12-13 MED ORDER — BUPIVACAINE-EPINEPHRINE 0.25% -1:200000 IJ SOLN
INTRAMUSCULAR | Status: DC | PRN
  Administered 2014-12-13: 9 mL

## 2014-12-13 MED ORDER — NEOSTIGMINE METHYLSULFATE 10 MG/10ML IV SOLN
INTRAVENOUS | Status: AC
  Filled 2014-12-13: qty 1

## 2014-12-13 MED ORDER — BISACODYL 10 MG RE SUPP: 10.0000 mg | Freq: Every day | RECTAL | Status: DC | PRN

## 2014-12-13 SURGICAL SUPPLY — 69 items
BIT DRILL 170X2.5X (BIT) IMPLANT
BIT DRILL 5/64X5 DISP (BIT) ×3 IMPLANT
BIT DRL 170X2.5X (BIT)
BLADE SAG 18X100X1.27 (BLADE) ×3 IMPLANT
CAPT SHLDR REVTOTAL 1 ×2 IMPLANT
CLOSURE WOUND 1/2 X4 (GAUZE/BANDAGES/DRESSINGS) ×1
COVER SURGICAL LIGHT HANDLE (MISCELLANEOUS) ×3 IMPLANT
CWS 400 CLOSED WOUND SUCTION KIT 10FR ×2 IMPLANT
DRAPE IMP U-DRAPE 54X76 (DRAPES) ×6 IMPLANT
DRAPE INCISE IOBAN 66X45 STRL (DRAPES) ×3 IMPLANT
DRAPE ORTHO SPLIT 77X108 STRL (DRAPES) ×6
DRAPE SURG ORHT 6 SPLT 77X108 (DRAPES) ×2 IMPLANT
DRAPE U-SHAPE 47X51 STRL (DRAPES) ×3 IMPLANT
DRAPE X-RAY CASS 24X20 (DRAPES) IMPLANT
DRILL 2.5 (BIT)
DRSG ADAPTIC 3X8 NADH LF (GAUZE/BANDAGES/DRESSINGS) ×3 IMPLANT
DRSG PAD ABDOMINAL 8X10 ST (GAUZE/BANDAGES/DRESSINGS) ×3 IMPLANT
DURAPREP 26ML APPLICATOR (WOUND CARE) ×3 IMPLANT
ELECT BLADE 4.0 EZ CLEAN MEGAD (MISCELLANEOUS) ×3
ELECT NDL TIP 2.8 STRL (NEEDLE) ×1 IMPLANT
ELECT NEEDLE TIP 2.8 STRL (NEEDLE) ×3 IMPLANT
ELECT REM PT RETURN 9FT ADLT (ELECTROSURGICAL) ×3
ELECTRODE BLDE 4.0 EZ CLN MEGD (MISCELLANEOUS) ×1 IMPLANT
ELECTRODE REM PT RTRN 9FT ADLT (ELECTROSURGICAL) ×1 IMPLANT
GAUZE SPONGE 4X4 12PLY STRL (GAUZE/BANDAGES/DRESSINGS) ×3 IMPLANT
GLOVE BIOGEL PI ORTHO PRO 7.5 (GLOVE) ×2
GLOVE BIOGEL PI ORTHO PRO SZ8 (GLOVE) ×2
GLOVE ORTHO TXT STRL SZ7.5 (GLOVE) ×3 IMPLANT
GLOVE PI ORTHO PRO STRL 7.5 (GLOVE) ×1 IMPLANT
GLOVE PI ORTHO PRO STRL SZ8 (GLOVE) ×1 IMPLANT
GLOVE SURG ORTHO 8.5 STRL (GLOVE) ×3 IMPLANT
GOWN STRL REUS W/ TWL LRG LVL3 (GOWN DISPOSABLE) ×1 IMPLANT
GOWN STRL REUS W/ TWL XL LVL3 (GOWN DISPOSABLE) ×2 IMPLANT
GOWN STRL REUS W/TWL LRG LVL3 (GOWN DISPOSABLE) ×3
GOWN STRL REUS W/TWL XL LVL3 (GOWN DISPOSABLE) ×6
HANDPIECE INTERPULSE COAX TIP (DISPOSABLE)
KIT BASIN OR (CUSTOM PROCEDURE TRAY) ×3 IMPLANT
KIT ROOM TURNOVER OR (KITS) ×3 IMPLANT
MANIFOLD NEPTUNE II (INSTRUMENTS) ×3 IMPLANT
NDL 1/2 CIR MAYO (NEEDLE) ×1 IMPLANT
NDL HYPO 25GX1X1/2 BEV (NEEDLE) ×1 IMPLANT
NEEDLE 1/2 CIR MAYO (NEEDLE) ×3 IMPLANT
NEEDLE HYPO 25GX1X1/2 BEV (NEEDLE) ×3 IMPLANT
NS IRRIG 1000ML POUR BTL (IV SOLUTION) ×3 IMPLANT
PACK SHOULDER (CUSTOM PROCEDURE TRAY) ×3 IMPLANT
PAD ARMBOARD 7.5X6 YLW CONV (MISCELLANEOUS) ×6 IMPLANT
PIN GUIDE 1.2 (PIN) IMPLANT
PIN GUIDE GLENOPHERE 1.5MX300M (PIN) IMPLANT
PIN METAGLENE 2.5 (PIN) IMPLANT
SET HNDPC FAN SPRY TIP SCT (DISPOSABLE) IMPLANT
SLING ARM LRG ADULT FOAM STRAP (SOFTGOODS) ×2 IMPLANT
SLING ARM MED ADULT FOAM STRAP (SOFTGOODS) IMPLANT
SPONGE LAP 18X18 X RAY DECT (DISPOSABLE) IMPLANT
SPONGE LAP 4X18 X RAY DECT (DISPOSABLE) ×3 IMPLANT
STRIP CLOSURE SKIN 1/2X4 (GAUZE/BANDAGES/DRESSINGS) ×2 IMPLANT
SUCTION FRAZIER TIP 10 FR DISP (SUCTIONS) ×3 IMPLANT
SUT FIBERWIRE #2 38 T-5 BLUE (SUTURE) ×6
SUT MNCRL AB 4-0 PS2 18 (SUTURE) ×3 IMPLANT
SUT VIC AB 2-0 CT1 27 (SUTURE) ×6
SUT VIC AB 2-0 CT1 TAPERPNT 27 (SUTURE) ×1 IMPLANT
SUT VICRYL 0 CT 1 36IN (SUTURE) ×3 IMPLANT
SUTURE FIBERWR #2 38 T-5 BLUE (SUTURE) ×2 IMPLANT
SYR CONTROL 10ML LL (SYRINGE) ×3 IMPLANT
TOWEL OR 17X24 6PK STRL BLUE (TOWEL DISPOSABLE) ×3 IMPLANT
TOWEL OR 17X26 10 PK STRL BLUE (TOWEL DISPOSABLE) ×3 IMPLANT
TOWER CARTRIDGE SMART MIX (DISPOSABLE) IMPLANT
TRAY FOLEY CATH 16FRSI W/METER (SET/KITS/TRAYS/PACK) IMPLANT
WATER STERILE IRR 1000ML POUR (IV SOLUTION) ×3 IMPLANT
YANKAUER SUCT BULB TIP NO VENT (SUCTIONS) ×3 IMPLANT

## 2014-12-13 NOTE — Anesthesia Preprocedure Evaluation (Addendum)
Anesthesia Evaluation  Patient identified by MRN, date of birth, ID band Patient awake    Reviewed: Allergy & Precautions, H&P , NPO status , Patient's Chart, lab work & pertinent test results  History of Anesthesia Complications (+) PONV and history of anesthetic complications  Airway Mallampati: II  TM Distance: <3 FB Neck ROM: full    Dental  (+) Teeth Intact, Dental Advidsory Given   Pulmonary neg pulmonary ROS,    Pulmonary exam normal       Cardiovascular negative cardio ROS Normal cardiovascular exam    Neuro/Psych PSYCHIATRIC DISORDERS Anxiety Depression negative neurological ROS     GI/Hepatic GERD-  ,(+) Hepatitis -, C  Endo/Other  negative endocrine ROS  Renal/GU      Musculoskeletal   Abdominal   Peds  Hematology   Anesthesia Other Findings   Reproductive/Obstetrics                          Anesthesia Physical Anesthesia Plan  ASA: II  Anesthesia Plan: General   Post-op Pain Management:    Induction: Intravenous  Airway Management Planned: Oral ETT  Additional Equipment:   Intra-op Plan:   Post-operative Plan: Extubation in OR  Informed Consent: I have reviewed the patients History and Physical, chart, labs and discussed the procedure including the risks, benefits and alternatives for the proposed anesthesia with the patient or authorized representative who has indicated his/her understanding and acceptance.   Dental Advisory Given and Dental advisory given  Plan Discussed with: Anesthesiologist, CRNA and Surgeon  Anesthesia Plan Comments:       Anesthesia Quick Evaluation

## 2014-12-13 NOTE — Discharge Instructions (Signed)
Ice to the shoulder as much as possible. Use sling for comfort while up moving around and out of the house. Ok to remove as you can and move the arm.  Do exercises at least 4 times per day per therapist instructions.Marland Kitchen.  Keep the bandage in place through the weekend, then remove and place BandAids to cover the wound.  Do not get the wound wet in the shower for one week from surgery.  Ok to use the left shoulder and arm for light daily activities, but do not lift heavy things or push yourself out of a chair  Call for appt in two weeks [phone]

## 2014-12-13 NOTE — Interval H&P Note (Signed)
History and Physical Interval Note:  12/13/2014 7:29 AM  Jared Beck  has presented today for surgery, with the diagnosis of LEFT SHOULDER CUFF INSUFFICIENCY  The various methods of treatment have been discussed with the patient and family. After consideration of risks, benefits and other options for treatment, the patient has consented to  Procedure(s): LEFT REVERSE TOTAL SHOULDER ARTHROPLASTY (Left) as a surgical intervention .  The patient's history has been reviewed, patient examined, no change in status, stable for surgery.  I have reviewed the patient's chart and labs.  Questions were answered to the patient's satisfaction.     Abundio Teuscher,STEVEN R

## 2014-12-13 NOTE — Anesthesia Procedure Notes (Addendum)
Anesthesia Regional Block:  Interscalene brachial plexus block  Pre-Anesthetic Checklist: ,, timeout performed, Correct Patient, Correct Site, Correct Laterality, Correct Procedure, Correct Position, site marked, Risks and benefits discussed,  Surgical consent,  Pre-op evaluation,  At surgeon's request and post-op pain management  Laterality: Left  Prep: chloraprep       Needles:  Injection technique: Single-shot  Needle Type: Echogenic Stimulator Needle     Needle Length: 5cm 5 cm Needle Gauge: 22 and 22 G    Additional Needles:  Procedures: ultrasound guided (picture in chart) and nerve stimulator Interscalene brachial plexus block  Nerve Stimulator or Paresthesia:  Response: bicep contraction, 0.45 mA,   Additional Responses:   Narrative:  Start time: 12/13/2014 6:37 AM End time: 12/13/2014 6:47 AM Injection made incrementally with aspirations every 5 mL.  Performed by: Personally  Anesthesiologist: Heather RobertsSINGER, JAMES  Additional Notes: Functioning IV was confirmed and monitors applied.  A 50mm 22ga echogenic arrow stimulator was used. Sterile prep and drape,hand hygiene and sterile gloves were used.Ultrasound guidance: relevant anatomy identified, needle position confirmed, local anesthetic spread visualized around nerve(s)., vascular puncture avoided.  Image printed for medical record.  Negative aspiration and negative test dose prior to incremental administration of local anesthetic. The patient tolerated the procedure well.   Procedure Name: Intubation Date/Time: 12/13/2014 7:40 AM Performed by: Carmela RimaMARTINELLI, Tamatha Gadbois F Pre-anesthesia Checklist: Patient being monitored, Suction available, Emergency Drugs available, Patient identified and Timeout performed Patient Re-evaluated:Patient Re-evaluated prior to inductionOxygen Delivery Method: Circle system utilized Preoxygenation: Pre-oxygenation with 100% oxygen Intubation Type: IV induction Ventilation: Mask ventilation without  difficulty Laryngoscope Size: Mac and 3 Grade View: Grade I Tube type: Oral Number of attempts: 1 Placement Confirmation: positive ETCO2,  ETT inserted through vocal cords under direct vision and breath sounds checked- equal and bilateral Secured at: 23 cm Tube secured with: Tape Dental Injury: Teeth and Oropharynx as per pre-operative assessment

## 2014-12-13 NOTE — Progress Notes (Signed)
Utilization Review Completed.Conny Situ T5/27/2016  

## 2014-12-13 NOTE — Anesthesia Postprocedure Evaluation (Addendum)
Anesthesia Post Note  Patient: Jared Beck  Procedure(s) Performed: Procedure(s) (LRB): LEFT REVERSE TOTAL SHOULDER ARTHROPLASTY (Left)  Anesthesia type: MAC/SAB  Patient location: PACU  Post pain: Pain level controlled  Post assessment: Patient's Cardiovascular Status Stable  Last Vitals:  Filed Vitals:   12/13/14 1000  BP: 96/61  Pulse: 98  Temp:   Resp: 14    Post vital signs: Reviewed and stable  Level of consciousness: sedated  Complications: No apparent anesthesia complications

## 2014-12-13 NOTE — Brief Op Note (Signed)
12/13/2014  9:21 AM  PATIENT:  Jared Beck  54 y.o. male  PRE-OPERATIVE DIAGNOSIS:  LEFT SHOULDER CUFF INSUFFICIENCY, OA  POST-OPERATIVE DIAGNOSIS:  LEFT SHOULDER CUFF INSUFFICIENCY, OA  PROCEDURE:  Procedure(s): LEFT REVERSE TOTAL SHOULDER ARTHROPLASTY (Left) DePuy Delta Xtend  SURGEON:  Surgeon(s) and Role:    * Beverely LowSteve Edinson Domeier, MD - Primary  PHYSICIAN ASSISTANT:   ASSISTANTS: Thea Gisthomas B Dixon, PA-C   ANESTHESIA:   regional and general  EBL:  Total I/O In: 1300 [I.V.:1300] Out: 250 [Blood:250]  BLOOD ADMINISTERED:none  DRAINS: med Hemovac in subdeltoid space, sewn in   LOCAL MEDICATIONS USED:  MARCAINE     SPECIMEN:  No Specimen  DISPOSITION OF SPECIMEN:  N/A  COUNTS:  YES  TOURNIQUET:  * No tourniquets in log *  DICTATION: .Other Dictation: Dictation Number [id number]  PLAN OF CARE: Admit to inpatient   PATIENT DISPOSITION:  PACU - hemodynamically stable.   Delay start of Pharmacological VTE agent (>24hrs) due to surgical blood loss or risk of bleeding: yes

## 2014-12-13 NOTE — Transfer of Care (Signed)
Immediate Anesthesia Transfer of Care Note  Patient: Jared Beck  Procedure(s) Performed: Procedure(s): LEFT REVERSE TOTAL SHOULDER ARTHROPLASTY (Left)  Patient Location: PACU  Anesthesia Type:General  Level of Consciousness: awake, alert  and oriented  Airway & Oxygen Therapy: Patient Spontanous Breathing and Patient connected to nasal cannula oxygen  Post-op Assessment: Report given to RN, Post -op Vital signs reviewed and stable and Patient moving all extremities X 4  Post vital signs: Reviewed and stable  Last Vitals:  Filed Vitals:   12/13/14 0544  BP: 135/83  Pulse: 76  Temp: 36.4 C  Resp: 20    Complications: No apparent anesthesia complications

## 2014-12-14 LAB — BASIC METABOLIC PANEL
Anion gap: 9 (ref 5–15)
BUN: 7 mg/dL (ref 6–20)
CALCIUM: 8.8 mg/dL — AB (ref 8.9–10.3)
CHLORIDE: 101 mmol/L (ref 101–111)
CO2: 24 mmol/L (ref 22–32)
Creatinine, Ser: 0.87 mg/dL (ref 0.61–1.24)
GFR calc Af Amer: >60 mL/min (ref >60)
Glucose, Bld: 130 mg/dL — ABNORMAL HIGH (ref 65–99)
POTASSIUM: 4.1 mmol/L (ref 3.5–5.1)
SODIUM: 134 mmol/L — AB (ref 135–145)

## 2014-12-14 LAB — HEMOGLOBIN AND HEMATOCRIT, BLOOD
HEMATOCRIT: 35.4 % — AB (ref 39.0–52.0)
HEMOGLOBIN: 12.2 g/dL — AB (ref 13.0–17.0)

## 2014-12-14 NOTE — Op Note (Signed)
NAMTerrilyn Saver:  Beck, Jared                ACCOUNT NO.:  [id number]  MEDICAL RECORD NO.:  [id number]  LOCATION:  [id number]                        FACILITY:  [facility]  PHYSICIAN:  Almedia BallsSteven R. Ranell PatrickNorris, M.D. DATE OF BIRTH:  04/24/1961  DATE OF PROCEDURE:  12/13/2014 DATE OF DISCHARGE:  12/14/2014                              OPERATIVE REPORT   PREOPERATIVE DIAGNOSIS:  Left shoulder rotator cuff insufficiency and osteoarthritis.  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  PROCEDURE PERFORMED:  Left shoulder reverse total shoulder arthroplasty using DePuy Delta Xtend prosthesis.  ATTENDING SURGEON:  Almedia BallsSteven R. Ranell PatrickNorris, M.D.  ASSISTANT:  Donnie Coffinhomas B. Dixon, PA-C, who scrubbed the entire procedure and necessary for satisfactory completion of surgery.  ANESTHESIA:  General anesthesia was used plus interscalene block.  ESTIMATED BLOOD LOSS:  Minimal.  FLUID REPLACEMENT:  1200 mL of crystalloids.  INSTRUMENT COUNTS:  Correct.  COMPLICATIONS:  There were no complications.  ANTIBIOTICS:  Perioperative antibiotics were given.  INDICATIONS:  The patient is a 54 year old male with worsening left shoulder pain secondary to failed rotator cuff surgery in the past.  [facility] shoulder pain, superior head migration, loss of fixed fulcrum mechanics.  Due to progressive pain and failure of conservative management, the patient presents now for reverse total shoulder arthroplasty.  Risks and benefits were discussed.  Informed consent obtained.  DESCRIPTION OF PROCEDURE:  After an adequate level of anesthesia was achieved, the patient was positioned in the modified beach-chair position.  Left shoulder correctly identified, sterilely prepped and draped in usual manner.  Time-out called.  We entered the shoulder using standard deltopectoral incision starting at the coracoid process extending to the anterior humerus.   Dissection down through subcutaneous tissues using the Bovie electrocautery.  Identified cephalic vein, took it laterally with the deltoid, pectoralis taken medially.  The conjoined tendon was identified and retracted medially.  Subscapularis released off the lesser tuberosity.  This was extremely thin and atrophic and stiff.  We went ahead and tagged it for retraction to protect the axillary nerve.  Next, we extended the shoulder and the humeral head was delivered from the wound.  There was no rotator cuff noted on top of the humeral head.  We entered the proximal humerus using a 6 mm reamer reaming up to size 12, placed our intramedullary resection guide for the head set on about 15 degrees of retroversion.  We resected with an oscillating saw.  We then removed extraneous osteophytes off the humeral side.  We then went ahead and reamed for metaphyseal, this was epi 2 left for the reaming.  We then placed the 12 stem with the epi 2 left metaphyseal component, placed in 15 degrees of retroversion.  Once that was impacted in place, we reduced the shoulder, subluxed that posteriorly, and then did a 360-degree capsular release and removal being careful to protect the axillary nerve.  Once we had full visualization of the glenoid surface, we removed the remaining cartilage with a Cobb elevator and found our center point inferiorly placed on the glenoid and drilled that with pin.  We then placed reamed for the Metaglene and then did our  peripheral hand reaming removing all osteophytes peripherally.  We then went ahead and drilled our central PEG hole and then impacted the standard Metaglene in place, placed a 48 lock screw inferiorly, 42 lock screw at the base of the coracoid and 18 nonlocked posteriorly, could not get one anteriorly due to just how close to the edge it was.  Had very secure fixation, placed our 38 standard glenosphere into position as it gave good coverage, and we were very  inferior on the glenoid, which was nice.  At this point, we reduced the shoulder with 38+ 3 trial, we were pleased with that reduction and tension, and it was quite stable.  We ranged the shoulder.  No impingement was noted.  We then removed the trial components and then irrigated the humeral canal.  We then used impaction grafting technique and placed in press-fit fashion the HA-coated 12 stem with the HA epi 2 left metaphyseal component, was set on 0 on the stem, but placed in 15 degrees retroversion, impacted that into position, placed our 38+ 3 poly, impacted that, reduced the shoulder, and we were quite pleased again with our soft tissue balancing and stability.  Negative sulcus. No gapping with external rotation and conjoined was tight.  We then irrigated thoroughly, closed the shoulder over a drain that will be placed through the deltoid, and we closed in layers, 0-Vicryl for the deltopectoral interval, 2-0 Vicryl subcutaneous closure, and 4-0 Monocryl for skin.  Steri-Strips applied followed by sterile dressing. The patient tolerated surgery well.     Almedia Balls. Ranell Patrick, M.D.     SRN/MEDQ  D:  12/13/2014  T:  12/14/2014  Job:  [id number]

## 2014-12-14 NOTE — Plan of Care (Signed)
Problem: Consults Goal: Diagnosis - Shoulder Surgery Reverse Total Shoulder Arthroplasty     

## 2014-12-14 NOTE — Progress Notes (Signed)
   Subjective: 1 Day Post-Op Procedure(s) (LRB): LEFT REVERSE TOTAL SHOULDER ARTHROPLASTY (Left) Patient reports pain as moderate.   Patient seen in rounds for Dr. Ranell PatrickNorris. Patient is well, and has had no acute complaints or problems other than pain in the left shoulder. No issues overnight other than poor sleep. Voiding well. No SOB or chest pain.  Plan is to go Home after hospital stay.  Objective: Vital signs in last 24 hours: Temp:  [97.6 F (36.4 C)-98.2 F (36.8 C)] 97.6 F (36.4 C) (05/28 0500) Pulse Rate:  [68-114] 68 (05/28 0500) Resp:  [10-16] 16 (05/28 0500) BP: (91-117)/(61-77) 107/70 mmHg (05/28 0500) SpO2:  [93 %-98 %] 94 % (05/28 0500)  Intake/Output from previous day:  Intake/Output Summary (Last 24 hours) at 12/14/14 0918 Last data filed at 12/14/14 0835  Gross per 24 hour  Intake    610 ml  Output   1200 ml  Net   -590 ml    Intake/Output this shift: Total I/O In: 120 [P.O.:120] Out: -   Labs:  Recent Labs  12/14/14 0432  HGB 12.2*    Recent Labs  12/14/14 0432  HCT 35.4*    Recent Labs  12/14/14 0432  NA 134*  K 4.1  CL 101  CO2 24  BUN 7  CREATININE 0.87  GLUCOSE 130*  CALCIUM 8.8*    EXAM General - Patient is Alert and Oriented Extremity - Neurologically intact Neurovascular intact Incision: dressing C/D/I Dressing/Incision - clean, dry, no drainage Motor Function - intact, moving hand and fingers well on exam.   Past Medical History  Diagnosis Date  . Arthritis   . Gout   . Gout   . Arthritis   . DDD (degenerative disc disease), lumbar   . Complication of anesthesia   . PONV (postoperative nausea and vomiting)     nausea  . Depression   . Anxiety   . GERD (gastroesophageal reflux disease)     occ  . Hepatitis     :dating someone with hepatitis C did not know until broke up 6 months ago"    Assessment/Plan: 1 Day Post-Op Procedure(s) (LRB): LEFT REVERSE TOTAL SHOULDER ARTHROPLASTY (Left) Active Problems:  S/P shoulder replacement  Estimated body mass index is 29.66 kg/(m^2) as calculated from the following:   Height as of this encounter: 5\' 8"  (1.727 m).   Weight as of this encounter: 88.451 kg (195 lb). Advance diet Up with therapy D/C IV fluids  Doing well. DC home this afternoon after therapy.   Dimitri PedAmber Shadrick Senne, PA-C Orthopaedic Surgery 12/14/2014, 9:18 AM

## 2014-12-14 NOTE — Evaluation (Signed)
Occupational Therapy Evaluation and Discharge Patient Details Name: Corbin Adearl W Achor MRN: [id number] DOB: 06/01/1961 Today's Date: 12/14/2014    History of Present Illness pt s/p reverse TSA on left   Clinical Impression   This 54 yo male admitted and underwent above presents to acute OT with all education completed with pt and wife. No further OT needs, we will sign off.    Follow Up Recommendations  No OT follow up    Equipment Recommendations  None recommended by OT       Precautions / Restrictions Precautions Precautions: Shoulder Shoulder Interventions: Shoulder sling/immobilizer;For comfort Required Braces or Orthoses: Sling Restrictions Weight Bearing Restrictions: Yes LUE Weight Bearing: Non weight bearing      Mobility Bed Mobility Overal bed mobility: Modified Independent                Transfers Overall transfer level: Modified independent                         ADL                                         General ADL Comments: Pt made aware that he can use his LUE functionally for BADLs to his tolerance (the only restrictions are no pulling, pushing, or lifting right now). Pt and girlfriend instructed on how to don shirt (putting LUE in first and reversing procedure to take shirt off). Pt aware he can raise his left arm to wash under it and put deodorant on.     Vision Additional Comments: No change from baseline          Pertinent Vitals/Pain Pain Assessment: No/denies pain     Hand Dominance Right   Extremity/Trunk Assessment Upper Extremity Assessment Upper Extremity Assessment: LUE deficits/detail LUE Deficits / Details: Shoulder sx this admission; elbow to fingers WNL LUE Coordination: decreased gross motor           Communication Communication Communication: No difficulties   Cognition Arousal/Alertness: Awake/alert Behavior During Therapy: WFL for tasks assessed/performed Overall Cognitive  Status: Within Functional Limits for tasks assessed                        Exercises   Other Exercises Other Exercises: pt and girlfriend aware that pt is not foreward flex LUE more than 140 degrees, abduct more than 60 degrees, and externally rotate more than 30 degrees past neutral   Shoulder Instructions Shoulder Instructions Donning/doffing shirt without moving shoulder: Caregiver independent with task Method for sponge bathing under operated UE: Independent Donning/doffing sling/immobilizer: Caregiver independent with task Correct positioning of sling/immobilizer: Caregiver independent with task Pendulum exercises (written home exercise program):  (NA) ROM for elbow, wrist and digits of operated UE: Independent Sling wearing schedule (on at all times/off for ADL's): Independent Dressing change:  (NA) Positioning of UE while sleeping: Caregiver independent with task    Home Living Family/patient expects to be discharged to:: Private residence Living Arrangements: Spouse/significant other Available Help at Discharge: Family;Available 24 hours/day Type of Home: House Home Access: Level entry     Home Layout: One level                          Prior Functioning/Environment Level of Independence: Independent  OT Diagnosis: Generalized weakness         OT Goals(Current goals can be found in the care plan section) Acute Rehab OT Goals Patient Stated Goal: home today  OT Frequency:                End of Session Nurse Communication:  (Pt ready to go from OT standpoint)  Activity Tolerance: Patient tolerated treatment well Patient left: in bed (up in room ready to go home)   Time: 1300-1311 OT Time Calculation (min): 11 min Charges:  OT General Charges $OT Visit: 1 Procedure OT Evaluation $Initial OT Evaluation Tier I: 1 Procedure  Evette Georges [phone] 12/14/2014, 2:42 PM

## 2014-12-17 ENCOUNTER — Encounter (HOSPITAL_COMMUNITY): Payer: Self-pay | Admitting: Orthopedic Surgery

## 2014-12-17 NOTE — Discharge Summary (Signed)
Physician Discharge Summary   Patient ID: Jared Beck MRN: [id number] DOB/AGE: [birth year] 54 y.o.  Admit date: 12/13/2014 Discharge date: 12/14/2014  Primary Diagnosis: S/P left reverse total shoulder arthroplasty  Admission Diagnoses:  Past Medical History  Diagnosis Date  . Arthritis   . Gout   . Gout   . Arthritis   . DDD (degenerative disc disease), lumbar   . Complication of anesthesia   . PONV (postoperative nausea and vomiting)     nausea  . Depression   . Anxiety   . GERD (gastroesophageal reflux disease)     occ  . Hepatitis     :dating someone with hepatitis C did not know until broke up 6 months ago"   Discharge Diagnoses:   Active Problems:   S/P shoulder replacement  Estimated body mass index is 29.66 kg/(m^2) as calculated from the following:   Height as of this encounter: 5\' 8"  (1.727 m).   Weight as of this encounter: 88.451 kg (195 lb).  Procedure:  Procedure(s) (LRB): LEFT REVERSE TOTAL SHOULDER ARTHROPLASTY (Left)   Consults: None  HPI: Pt is a 54 y.o. male complaining of left shoulder pain for multiple years. Pain had continually increased since the beginning. X-rays in the clinic show end-stage arthritic changes of the left shoulder. Pt has tried various conservative treatments which have failed to alleviate their symptoms, including injections and therapy. Various options are discussed with the patient. Risks, benefits and expectations were discussed with the patient. Patient understand the risks, benefits and expectations and wishes to proceed with surgery.   Laboratory Data: Admission on 12/13/2014, Discharged on 12/14/2014  Component Date Value Ref Range Status  . Hemoglobin 12/14/2014 12.2* 13.0 - 17.0 g/dL Final  . HCT 12/16/2014 35.4* 39.0 - 52.0 % Final  . Sodium 12/14/2014 134* 135 - 145 mmol/L Final  . Potassium 12/14/2014 4.1  3.5 - 5.1 mmol/L Final  . Chloride 12/14/2014 101  101 - 111 mmol/L Final  . CO2 12/14/2014 24  22 - 32  mmol/L Final  . Glucose, Bld 12/14/2014 130* 65 - 99 mg/dL Final  . BUN 12/16/2014 7  6 - 20 mg/dL Final  . Creatinine, Ser 12/14/2014 0.87  0.61 - 1.24 mg/dL Final  . Calcium 12/16/2014 8.8* 8.9 - 10.3 mg/dL Final  . GFR calc non Af Amer 12/14/2014 >60  >60 mL/min Final  . GFR calc Af Amer 12/14/2014 >60  >60 mL/min Final   Comment: (NOTE) The eGFR has been calculated using the CKD EPI equation. This calculation has not been validated in all clinical situations. eGFR's persistently <60 mL/min signify possible Chronic Kidney Disease.   12/16/2014 gap 12/14/2014 9  5 - 15 Final  Hospital Outpatient Visit on 12/03/2014  Component Date Value Ref Range Status  . MRSA, PCR 12/03/2014 NEGATIVE  NEGATIVE Final  . Staphylococcus aureus 12/03/2014 NEGATIVE  NEGATIVE Final   Comment:        The Xpert SA Assay (FDA approved for NASAL specimens in patients over 57 years of age), is one component of a comprehensive surveillance program.  Test performance has been validated by [facility] for patients greater than or equal to 39 year old. It is not intended to diagnose infection nor to guide or monitor treatment.   . Sodium 12/03/2014 135  135 - 145 mmol/L Final  . Potassium 12/03/2014 3.5  3.5 - 5.1 mmol/L Final  . Chloride 12/03/2014 102  101 - 111 mmol/L Final  . CO2 12/03/2014 25  22 - 32 mmol/L Final  . Glucose, Bld 12/03/2014 88  65 - 99 mg/dL Final  . BUN 12/03/2014 6  6 - 20 mg/dL Final  . Creatinine, Ser 12/03/2014 0.99  0.61 - 1.24 mg/dL Final  . Calcium 12/03/2014 9.0  8.9 - 10.3 mg/dL Final  . Total Protein 12/03/2014 6.8  6.5 - 8.1 g/dL Final  . Albumin 12/03/2014 3.8  3.5 - 5.0 g/dL Final  . AST 12/03/2014 31  15 - 41 U/L Final  . ALT 12/03/2014 20  17 - 63 U/L Final  . Alkaline Phosphatase 12/03/2014 64  38 - 126 U/L Final  . Total Bilirubin 12/03/2014 2.4* 0.3 - 1.2 mg/dL Final  . GFR calc non Af Amer 12/03/2014 >60  >60 mL/min Final  . GFR calc Af Amer 12/03/2014 >60   >60 mL/min Final   Comment: (NOTE) The eGFR has been calculated using the CKD EPI equation. This calculation has not been validated in all clinical situations. eGFR's persistently <60 mL/min signify possible Chronic Kidney Disease.   . Anion gap 12/03/2014 8  5 - 15 Final  . WBC 12/03/2014 5.3  4.0 - 10.5 K/uL Final  . RBC 12/03/2014 4.67  4.22 - 5.81 MIL/uL Final  . Hemoglobin 12/03/2014 14.3  13.0 - 17.0 g/dL Final  . HCT 12/03/2014 41.1  39.0 - 52.0 % Final  . MCV 12/03/2014 88.0  78.0 - 100.0 fL Final  . MCH 12/03/2014 30.6  26.0 - 34.0 pg Final  . MCHC 12/03/2014 34.8  30.0 - 36.0 g/dL Final  . RDW 12/03/2014 12.5  11.5 - 15.5 % Final  . Platelets 12/03/2014 208  150 - 400 K/uL Final     X-Rays:Dg Shoulder Left Port  12/13/2014   CLINICAL DATA:  Left shoulder replacement.  EXAM: LEFT SHOULDER - 1 VIEW  COMPARISON:  09/27/2013.  FINDINGS: Left shoulder replacement. The humeral cup is a line with the lower surface of the glenoid prosthesis. Hardware intact. No acute bony abnormality. Prominent degenerative changes about the acromioclavicular joint.  IMPRESSION: Left shoulder replacement.   Electronically Signed   By: Marcello Moores  Register   On: 12/13/2014 10:11     Hospital Course: Jared Beck is a 54 y.o. who was admitted to [facility]. They were brought to the operating room on 12/13/2014 and underwent Procedure(s): LEFT REVERSE TOTAL SHOULDER ARTHROPLASTY.  Patient tolerated the procedure well and was later transferred to the recovery room and then to the orthopaedic floor for postoperative care.  They were given PO and IV analgesics for pain control following their surgery.  They were given 24 hours of postoperative antibiotics of  Anti-infectives    Start     Dose/Rate Route Frequency Ordered Stop   12/13/14 1300  clindamycin (CLEOCIN) IVPB 600 mg     600 mg 100 mL/hr over 30 Minutes Intravenous Every 6 hours 12/13/14 1026 12/14/14 0321   12/13/14 1030  doxycycline  (VIBRAMYCIN) capsule 100 mg  Status:  Discontinued     100 mg Oral 2 times daily 12/13/14 1026 12/13/14 1029   12/13/14 0530  clindamycin (CLEOCIN) IVPB 900 mg     900 mg 100 mL/hr over 30 Minutes Intravenous To ShortStay Surgical 12/12/14 1322 12/13/14 0753    PT and OT were ordered for total joint protocol.  Discharge planning consulted to help with postop disposition and equipment needs.  Patient had a fair night on the evening of surgery but did have a significant increase in discomfort once  his block wore off.  They started to get up OOB with therapy on day one. Hemovac drain was pulled without difficulty.  Patient was seen in rounds and was ready to go home.   Diet: Regular diet Activity:WBAT Follow-up:in 2 weeks Disposition - Home Discharged Condition: stable      Medication List    TAKE these medications        ARIPiprazole 5 MG tablet  Commonly known as:  ABILIFY  Take 5 mg by mouth daily.     colchicine 0.6 MG tablet  Take 0.6 mg by mouth daily.     colchicine 0.6 MG tablet  Take 2 tablets (1.2 mg total) by mouth 2 (two) times daily.     doxycycline 100 MG capsule  Commonly known as:  VIBRAMYCIN  Take 1 capsule (100 mg total) by mouth 2 (two) times daily.     FLUoxetine 20 MG tablet  Commonly known as:  PROZAC  Take 60 mg by mouth daily.     HYDROcodone-acetaminophen 5-325 MG per tablet  Commonly known as:  NORCO/VICODIN  Take 2 tablets by mouth every 4 (four) hours as needed.     ibuprofen 200 MG tablet  Commonly known as:  ADVIL,MOTRIN  Take 400 mg by mouth every 5 (five) hours as needed.     methocarbamol 500 MG tablet  Commonly known as:  ROBAXIN  Take 1 tablet (500 mg total) by mouth 3 (three) times daily as needed.     oxyCODONE-acetaminophen 5-325 MG per tablet  Commonly known as:  ROXICET  Take 1-2 tablets by mouth every 4 (four) hours as needed for severe pain.     ZYLOPRIM 100 MG tablet  Generic drug:  allopurinol  Take 100 mg by mouth  daily.           Follow-up Information    Follow up with NORRIS,STEVEN R, MD. Call in 2 weeks.   Specialty:  Orthopedic Surgery   Why:  [phone]   Contact information:   [address] [place] [address] [phone]       Signed: Ardeen Jourdain, PA-C Orthopaedic Surgery 12/17/2014, 8:13 AM

## 2015-02-26 ENCOUNTER — Encounter (HOSPITAL_COMMUNITY): Payer: Self-pay | Admitting: Emergency Medicine

## 2015-02-26 ENCOUNTER — Emergency Department (HOSPITAL_COMMUNITY)
Admission: EM | Admit: 2015-02-26 | Discharge: 2015-02-26 | Disposition: A | Discharge status: Home / Self Care | Payer: Medicaid Other | Attending: Emergency Medicine | Admitting: Emergency Medicine

## 2015-02-26 DIAGNOSIS — Z880 Allergy status to penicillin: Secondary | ICD-10-CM | POA: Insufficient documentation

## 2015-02-26 DIAGNOSIS — F419 Anxiety disorder, unspecified: Secondary | ICD-10-CM | POA: Insufficient documentation

## 2015-02-26 DIAGNOSIS — Z8719 Personal history of other diseases of the digestive system: Secondary | ICD-10-CM | POA: Diagnosis not present

## 2015-02-26 DIAGNOSIS — M109 Gout, unspecified: Secondary | ICD-10-CM | POA: Diagnosis not present

## 2015-02-26 DIAGNOSIS — F329 Major depressive disorder, single episode, unspecified: Secondary | ICD-10-CM | POA: Insufficient documentation

## 2015-02-26 DIAGNOSIS — M1990 Unspecified osteoarthritis, unspecified site: Secondary | ICD-10-CM | POA: Diagnosis not present

## 2015-02-26 DIAGNOSIS — Z79899 Other long term (current) drug therapy: Secondary | ICD-10-CM | POA: Insufficient documentation

## 2015-02-26 DIAGNOSIS — M79671 Pain in right foot: Secondary | ICD-10-CM | POA: Diagnosis present

## 2015-02-26 MED ORDER — PREDNISONE 10 MG PO TABS: ORAL_TABLET | ORAL | Status: DC

## 2015-02-26 MED ORDER — OXYCODONE-ACETAMINOPHEN 5-325 MG PO TABS: 1.0000 | ORAL_TABLET | Freq: Four times a day (QID) | ORAL | Status: DC | PRN

## 2015-02-26 NOTE — ED Notes (Signed)
Patient states "I got gout in my right foot." Patient ambulatory at triage.

## 2015-02-26 NOTE — Discharge Instructions (Signed)
Gout °Gout is when your joints become red, sore, and swell (inflamed). This is caused by the buildup of uric acid crystals in the joints. Uric acid is a chemical that is normally in the blood. If the level of uric acid gets too high in the blood, these crystals form in your joints and tissues. Over time, these crystals can form into masses near the joints and tissues. These masses can destroy bone and cause the bone to look misshapen (deformed). °HOME CARE  °· Do not take aspirin for pain. °· Only take medicine as told by your doctor. °· Rest the joint as much as you can. When in bed, keep sheets and blankets off painful areas. °· Keep the sore joints raised (elevated). °· Put warm or cold packs on painful joints. Use of warm or cold packs depends on which works best for you. °· Use crutches if the painful joint is in your leg. °· Drink enough fluids to keep your pee (urine) clear or pale yellow. Limit alcohol, sugary drinks, and drinks with fructose in them. °· Follow your diet instructions. Pay careful attention to how much protein you eat. Include fruits, vegetables, whole grains, and fat-free or low-fat milk products in your daily diet. Talk to your doctor or dietitian about the use of coffee, vitamin C, and cherries. These may help lower uric acid levels. °· Keep a healthy body weight. °GET HELP RIGHT AWAY IF:  °· You have watery poop (diarrhea), throw up (vomit), or have any side effects from medicines. °· You do not feel better in 24 hours, or you are getting worse. °· Your joint becomes suddenly more tender, and you have chills or a fever. °MAKE SURE YOU:  °· Understand these instructions. °· Will watch your condition. °· Will get help right away if you are not doing well or get worse. °Document Released: 04/13/2008 Document Revised: 11/19/2013 Document Reviewed: 02/16/2012 °ExitCare® Patient Information ©2015 [employer]. This information is not intended to replace advice given to you by your health care  provider. Make sure you discuss any questions you have with your health care provider. ° °

## 2015-02-26 NOTE — ED Provider Notes (Signed)
CSN: [id number]     Arrival date & time 02/26/15  1440 History   First MD Initiated Contact with Patient 02/26/15 1459     Chief Complaint  Patient presents with  . Gout     (Consider location/radiation/quality/duration/timing/severity/associated sxs/prior Treatment) HPI Comments: Pt comes in with c/o gout to the right foot/big toe that started a couple of days ago. He states that he has not had injury, no fever. He has taken allopurinol and colchicine without relief  The history is provided by the patient. No language interpreter was used.    Past Medical History  Diagnosis Date  . Arthritis   . Gout   . Gout   . Arthritis   . DDD (degenerative disc disease), lumbar   . Complication of anesthesia   . PONV (postoperative nausea and vomiting)     nausea  . Depression   . Anxiety   . GERD (gastroesophageal reflux disease)     occ  . Hepatitis     :dating someone with hepatitis C did not know until broke up 6 months ago"   Past Surgical History  Procedure Laterality Date  . Rotator cuff surgery Left 16  . Hernia repair  93    umbilical, groin as child  . Cholecystectomy  10  . Appendectomy  93  . Reverse shoulder arthroplasty Left 12/13/2014    Procedure: LEFT REVERSE TOTAL SHOULDER ARTHROPLASTY;  Surgeon: Beverely Low, MD;  Location: [facility] OR;  Service: Orthopedics;  Laterality: Left;   Family History  Problem Relation Age of Onset  . Cancer Mother   . Cancer Father    Social History  Substance Use Topics  . Smoking status: Never Smoker   . Smokeless tobacco: Never Used  . Alcohol Use: No    Review of Systems  All other systems reviewed and are negative.     Allergies  Penicillins and Vicodin  Home Medications   Prior to Admission medications   Medication Sig Start Date End Date Taking? Authorizing Provider  allopurinol (ZYLOPRIM) 100 MG tablet Take 100 mg by mouth daily. 03/26/14 03/26/15  Historical Provider, MD  ARIPiprazole (ABILIFY) 5 MG tablet Take 5 mg  by mouth daily.    Historical Provider, MD  colchicine 0.6 MG tablet Take 0.6 mg by mouth daily. 03/26/14 03/26/15  Historical Provider, MD  colchicine 0.6 MG tablet Take 2 tablets (1.2 mg total) by mouth 2 (two) times daily. Patient not taking: Reported on 12/02/2014 08/06/14   Raeford Razor, MD  doxycycline (VIBRAMYCIN) 100 MG capsule Take 1 capsule (100 mg total) by mouth 2 (two) times daily. Patient not taking: Reported on 12/02/2014 03/20/14   [name], MD  FLUoxetine (PROZAC) 20 MG tablet Take 60 mg by mouth daily.    Historical Provider, MD  HYDROcodone-acetaminophen (NORCO/VICODIN) 5-325 MG per tablet Take 2 tablets by mouth every 4 (four) hours as needed. Patient not taking: Reported on 12/02/2014 03/20/14   [name], MD  ibuprofen (ADVIL,MOTRIN) 200 MG tablet Take 400 mg by mouth every 5 (five) hours as needed.    Historical Provider, MD  methocarbamol (ROBAXIN) 500 MG tablet Take 1 tablet (500 mg total) by mouth 3 (three) times daily as needed. 12/13/14   Beverely Low, MD  oxyCODONE-acetaminophen (ROXICET) 5-325 MG per tablet Take 1-2 tablets by mouth every 4 (four) hours as needed for severe pain. 12/13/14   Beverely Low, MD   BP 122/87 mmHg  Pulse 98  Temp(Src) 98 F (36.7 C) (Oral)  Resp  17  Ht  (1.727 m)  Wt 184 lb (83.462 kg)  BMI 27.98 kg/m2  SpO2 100% Physical Exam  Constitutional: He is oriented to person, place, and time. He appears well-developed and well-nourished.  Cardiovascular: Normal rate and regular rhythm.   Pulmonary/Chest: Effort normal and breath sounds normal.  Abdominal: Soft. Bowel sounds are normal. There is no tenderness.  Musculoskeletal: Normal range of motion.  Neurological: He is alert and oriented to person, place, and time.  Skin:  Redness and warmth to the right big toe. Pulses intact  Nursing note and vitals reviewed.   ED Course  Procedures (including critical care time) Labs Review Labs Reviewed - No data to display  Imaging  Review No results found.   EKG Interpretation None      MDM   Final diagnoses:  Gout of big toe    History and exam consistent with gout. Given prednisone and oxycodone for pain   Teressa Lower, NP 02/26/15 1537  Tilden Fossa, MD 02/26/15 1622

## 2015-05-12 ENCOUNTER — Emergency Department (HOSPITAL_COMMUNITY): Payer: Medicaid Other

## 2015-05-12 ENCOUNTER — Emergency Department (HOSPITAL_COMMUNITY)
Admission: EM | Admit: 2015-05-12 | Discharge: 2015-05-12 | Disposition: A | Discharge status: Home / Self Care | Payer: Medicaid Other | Attending: Emergency Medicine | Admitting: Emergency Medicine

## 2015-05-12 ENCOUNTER — Encounter (HOSPITAL_COMMUNITY): Payer: Self-pay | Admitting: Emergency Medicine

## 2015-05-12 DIAGNOSIS — Z79899 Other long term (current) drug therapy: Secondary | ICD-10-CM | POA: Insufficient documentation

## 2015-05-12 DIAGNOSIS — F419 Anxiety disorder, unspecified: Secondary | ICD-10-CM | POA: Diagnosis not present

## 2015-05-12 DIAGNOSIS — M5432 Sciatica, left side: Secondary | ICD-10-CM | POA: Insufficient documentation

## 2015-05-12 DIAGNOSIS — Z8719 Personal history of other diseases of the digestive system: Secondary | ICD-10-CM | POA: Insufficient documentation

## 2015-05-12 DIAGNOSIS — F329 Major depressive disorder, single episode, unspecified: Secondary | ICD-10-CM | POA: Insufficient documentation

## 2015-05-12 DIAGNOSIS — M109 Gout, unspecified: Secondary | ICD-10-CM | POA: Diagnosis not present

## 2015-05-12 DIAGNOSIS — M1990 Unspecified osteoarthritis, unspecified site: Secondary | ICD-10-CM | POA: Insufficient documentation

## 2015-05-12 DIAGNOSIS — M25552 Pain in left hip: Secondary | ICD-10-CM | POA: Diagnosis present

## 2015-05-12 DIAGNOSIS — Z880 Allergy status to penicillin: Secondary | ICD-10-CM | POA: Insufficient documentation

## 2015-05-12 MED ORDER — OXYCODONE-ACETAMINOPHEN 5-325 MG PO TABS: 1.0000 | ORAL_TABLET | ORAL | Status: DC | PRN

## 2015-05-12 MED ORDER — NAPROXEN 500 MG PO TABS: 500.0000 mg | ORAL_TABLET | Freq: Two times a day (BID) | ORAL | Status: DC

## 2015-05-12 NOTE — ED Provider Notes (Signed)
CSN: [id number]     Arrival date & time 05/12/15  1217 History  By signing my name below, I, Gwenyth Ober, attest that this documentation has been prepared under the direction and in the presence of Kym Fenter, PA-C.  Electronically Signed: Gwenyth Ober, ED Scribe. 05/12/2015. 1:28 PM.   Chief Complaint  Patient presents with  . Hip Pain    left   Patient is a 54 y.o. male presenting with hip pain. The history is provided by the patient. No language interpreter was used.  Hip Pain This is a new problem. The current episode started more than 1 week ago. The problem occurs every several days. The problem has not changed since onset.Pertinent negatives include no abdominal pain. Nothing aggravates the symptoms. Nothing relieves the symptoms. Treatments tried: Ibuprofen. The treatment provided no relief.    HPI Comments: Jared Beck is a 54 y.o. male with a history of gout who presents to the Emergency Department complaining of intermittent, moderate, sharp and stabbing, left hip pain that radiates down his left posterior thigh, started 2 months ago and became worse 2 days ago. He states lower back pain and intermittent tingling of his left leg as an associated symptom. His pain becomes worse with walking, bending over and lying on his left side. Pt has tried Ibuprofen with no relief. He denies any new injuries or falls. Pt also denies calf pain, abdominal pain, urine or bowel changes, and weakness of the LE's.    Past Medical History  Diagnosis Date  . Arthritis   . Gout   . Gout   . Arthritis   . DDD (degenerative disc disease), lumbar   . Complication of anesthesia   . PONV (postoperative nausea and vomiting)     nausea  . Depression   . Anxiety   . GERD (gastroesophageal reflux disease)     occ  . Hepatitis     :dating someone with hepatitis C did not know until broke up 6 months ago"   Past Surgical History  Procedure Laterality Date  . Rotator cuff surgery Left 14   . Hernia repair  93    umbilical, groin as child  . Cholecystectomy  10  . Appendectomy  93  . Reverse shoulder arthroplasty Left 12/13/2014    Procedure: LEFT REVERSE TOTAL SHOULDER ARTHROPLASTY;  Surgeon: Beverely Low, MD;  Location: [facility] OR;  Service: Orthopedics;  Laterality: Left;   Family History  Problem Relation Age of Onset  . Cancer Mother   . Cancer Father    Social History  Substance Use Topics  . Smoking status: Never Smoker   . Smokeless tobacco: Never Used  . Alcohol Use: No   Review of Systems  Gastrointestinal: Negative for abdominal pain and constipation.  Genitourinary: Negative for dysuria.  Musculoskeletal: Positive for back pain and arthralgias.  All other systems reviewed and are negative.  Allergies  Penicillins and Vicodin  Home Medications   Prior to Admission medications   Medication Sig Start Date End Date Taking? Authorizing Provider  allopurinol (ZYLOPRIM) 100 MG tablet Take 100 mg by mouth daily. 03/26/14 03/26/15  Historical Provider, MD  ARIPiprazole (ABILIFY) 5 MG tablet Take 5 mg by mouth daily.    Historical Provider, MD  colchicine 0.6 MG tablet Take 2 tablets (1.2 mg total) by mouth 2 (two) times daily. Patient not taking: Reported on 12/02/2014 08/06/14   Raeford Razor, MD  doxycycline (VIBRAMYCIN) 100 MG capsule Take 1 capsule (100 mg total) by mouth  2 (two) times daily. Patient not taking: Reported on 12/02/2014 03/20/14   Glynn OctaveStephen Rancour, MD  FLUoxetine (PROZAC) 20 MG tablet Take 60 mg by mouth daily.    Historical Provider, MD  ibuprofen (ADVIL,MOTRIN) 200 MG tablet Take 400 mg by mouth every 5 (five) hours as needed for mild pain.     Historical Provider, MD  methocarbamol (ROBAXIN) 500 MG tablet Take 1 tablet (500 mg total) by mouth 3 (three) times daily as needed. Patient taking differently: Take 500 mg by mouth 3 (three) times daily as needed for muscle spasms.  12/13/14   Beverely LowSteve Norris, MD  oxyCODONE-acetaminophen (PERCOCET/ROXICET) 5-325  MG per tablet Take 1-2 tablets by mouth every 6 (six) hours as needed for severe pain. 02/26/15   Teressa LowerVrinda Pickering, NP  predniSONE (DELTASONE) 10 MG tablet 6 day step down dose 02/26/15   Teressa LowerVrinda Pickering, NP  traZODone (DESYREL) 100 MG tablet Take 100 mg by mouth at bedtime.    Historical Provider, MD   BP 109/69 mmHg  Pulse 67  Temp(Src) 97.9 F (36.6 C) (Oral)  Resp 16  Ht 5\' 8"  (1.727 m)  Wt 178 lb (80.74 kg)  BMI 27.07 kg/m2  SpO2 100% Physical Exam  Constitutional: He appears well-developed and well-nourished. No distress.  HENT:  Head: Normocephalic and atraumatic.  Eyes: Conjunctivae and EOM are normal.  Neck: Neck supple. No tracheal deviation present.  Cardiovascular: Normal rate, regular rhythm and normal heart sounds.   Pulmonary/Chest: Effort normal and breath sounds normal. No respiratory distress. He has no wheezes.  Abdominal: Soft. There is no tenderness.  Musculoskeletal: He exhibits tenderness.  Tenderness at the left SI joint Pain with internal rotation Sensation intact  5/5 strength against resistance LE  Skin: Skin is warm and dry.  Psychiatric: He has a normal mood and affect. His behavior is normal.  Nursing note and vitals reviewed.   ED Course  Procedures  DIAGNOSTIC STUDIES: Oxygen Saturation is 100% on RA, normal by my interpretation.    COORDINATION OF CARE: 1:25 PM Discussed sciatic nerve pain. Discussed treatment plan with pt. Advised pt to discuss pain with PCP at his scheduled appointment in 3 days. Pt agreed to plan.    Imaging Review Dg Hip Unilat With Pelvis 2-3 Views Left  05/12/2015  CLINICAL DATA:  Progressive left hip pain for past 3 days EXAM: DG HIP (WITH OR WITHOUT PELVIS) 2-3V LEFT COMPARISON:  Abdomen series January 05, 2013 FINDINGS: Frontal pelvis as well as frontal and lateral left hip images were obtained. There is evidence of old healed trauma involving the medial aspect of each ischium. No acute fracture is seen. No  dislocation. Hip joints appear symmetric bilaterally. There is stable bony overgrowth along each superior, lateral iliac crest. There is degenerative change in the visualized lumbar spine. Calcifications are noted in the soft tissues of the buttocks regions bilaterally. IMPRESSION: Evidence of old healed trauma in the medial ischial regions bilaterally. No acute fracture dislocation. Hip joints appear symmetric and within normal limits bilaterally. Stable bony overgrowth is noted along each superior, lateral iliac crest. Electronically Signed   By: Bretta BangWilliam  Woodruff III M.D.   On: 05/12/2015 13:02   I have personally reviewed and evaluated these images as part of my medical decision-making.    MDM   Final diagnoses:  Sciatica of left side    Pt is well appearing, non-toxic.  Ambulatory.  No focal neuro deficits.  Likely sciatica.  Pt agrees to close PMD f/u.  No concerning  sx's for septic joint, no AVN  I personally performed the services described in this documentation, which was scribed in my presence. The recorded information has been reviewed and is accurate.    Pauline Aus, PA-C 05/14/15 1542  Blane Ohara, MD 05/16/15 [phone]

## 2015-05-12 NOTE — ED Notes (Signed)
Having left hip pain for last 2 months, rates pain 8/10.  Denies injury.  History of gout.

## 2015-05-12 NOTE — Discharge Instructions (Signed)
°  Sciatica °Sciatica is pain, weakness, numbness, or tingling along your sciatic nerve. The nerve starts in the lower back and runs down the back of each leg. Nerve damage or certain conditions pinch or put pressure on the sciatic nerve. This causes the pain, weakness, and other discomforts of sciatica. °HOME CARE  °· Only take medicine as told by your doctor. °· Apply ice to the affected area for 20 minutes. Do this 3-4 times a day for the first 48-72 hours. Then try heat in the same way. °· Exercise, stretch, or do your usual activities if these do not make your pain worse. °· Go to physical therapy as told by your doctor. °· Keep all doctor visits as told. °· Do not wear high heels or shoes that are not supportive. °· Get a firm mattress if your mattress is too soft to lessen pain and discomfort. °GET HELP RIGHT AWAY IF:  °· You cannot control when you poop (bowel movement) or pee (urinate). °· You have more weakness in your lower back, lower belly (pelvis), butt (buttocks), or legs. °· You have redness or puffiness (swelling) of your back. °· You have a burning feeling when you pee. °· You have pain that gets worse when you lie down. °· You have pain that wakes you from your sleep. °· Your pain is worse than past pain. °· Your pain lasts longer than 4 weeks. °· You are suddenly losing weight without reason. °MAKE SURE YOU:  °· Understand these instructions. °· Will watch this condition. °· Will get help right away if you are not doing well or get worse. °  °This information is not intended to replace advice given to you by your health care provider. Make sure you discuss any questions you have with your health care provider. °  °Document Released: 04/13/2008 Document Revised: 03/26/2015 Document Reviewed: 11/14/2011 °Elsevier Interactive Patient Education ©2016 [employer] ° ° °

## 2015-05-16 DIAGNOSIS — M4726 Other spondylosis with radiculopathy, lumbar region: Secondary | ICD-10-CM | POA: Insufficient documentation

## 2015-05-16 DIAGNOSIS — E782 Mixed hyperlipidemia: Secondary | ICD-10-CM | POA: Insufficient documentation

## 2015-05-16 DIAGNOSIS — G2581 Restless legs syndrome: Secondary | ICD-10-CM | POA: Insufficient documentation

## 2015-05-16 DIAGNOSIS — R17 Unspecified jaundice: Secondary | ICD-10-CM | POA: Insufficient documentation

## 2015-06-26 DIAGNOSIS — N529 Male erectile dysfunction, unspecified: Secondary | ICD-10-CM | POA: Insufficient documentation

## 2015-10-31 ENCOUNTER — Encounter (HOSPITAL_COMMUNITY): Payer: Self-pay | Admitting: Emergency Medicine

## 2015-10-31 ENCOUNTER — Emergency Department (HOSPITAL_COMMUNITY)
Admission: EM | Admit: 2015-10-31 | Discharge: 2015-10-31 | Disposition: A | Discharge status: Home / Self Care | Payer: Medicare Other | Attending: Emergency Medicine | Admitting: Emergency Medicine

## 2015-10-31 DIAGNOSIS — F329 Major depressive disorder, single episode, unspecified: Secondary | ICD-10-CM | POA: Diagnosis not present

## 2015-10-31 DIAGNOSIS — S39012A Strain of muscle, fascia and tendon of lower back, initial encounter: Secondary | ICD-10-CM | POA: Insufficient documentation

## 2015-10-31 DIAGNOSIS — Z79899 Other long term (current) drug therapy: Secondary | ICD-10-CM | POA: Insufficient documentation

## 2015-10-31 DIAGNOSIS — Y9289 Other specified places as the place of occurrence of the external cause: Secondary | ICD-10-CM | POA: Insufficient documentation

## 2015-10-31 DIAGNOSIS — W133XXA Fall through floor, initial encounter: Secondary | ICD-10-CM | POA: Insufficient documentation

## 2015-10-31 DIAGNOSIS — Y939 Activity, unspecified: Secondary | ICD-10-CM | POA: Diagnosis not present

## 2015-10-31 DIAGNOSIS — Y999 Unspecified external cause status: Secondary | ICD-10-CM | POA: Diagnosis not present

## 2015-10-31 DIAGNOSIS — M545 Low back pain: Secondary | ICD-10-CM | POA: Diagnosis present

## 2015-10-31 MED ORDER — METHOCARBAMOL 500 MG PO TABS: 500.0000 mg | ORAL_TABLET | Freq: Two times a day (BID) | ORAL | Status: DC

## 2015-10-31 MED ORDER — IBUPROFEN 800 MG PO TABS: 800.0000 mg | ORAL_TABLET | Freq: Three times a day (TID) | ORAL | Status: DC | PRN

## 2015-10-31 NOTE — Discharge Instructions (Signed)

## 2015-10-31 NOTE — ED Provider Notes (Signed)
CSN: [id number]     Arrival date & time 10/31/15  1720 History   First MD Initiated Contact with Patient 10/31/15 1742     Chief Complaint  Patient presents with  . Back Pain     (Consider location/radiation/quality/duration/timing/severity/associated sxs/prior Treatment) HPI   55 year old male with history of arthritis, and gout along with degenerative disc disease who presents for evaluation of low back pain. Patient states this morning his girlfriend fell down to the floor and he has to help her up from the floor. In the process he felt a pull and sharp sensation to his lower back and since then he has been endorsing a 9 out of 10 sharp throbbing pain to his low back radiates down to both legs. Pain is worsened with movement. He tries taking Advil at home without adequate relief. He denies any fever, chills, bowel bladder incontinence, saddle anesthesia, focal numbness or weakness. He is able to ambulate. Denies history of IV drug use or active cancer.  Past Medical History  Diagnosis Date  . Arthritis   . Gout   . Gout   . Arthritis   . DDD (degenerative disc disease), lumbar   . Complication of anesthesia   . PONV (postoperative nausea and vomiting)     nausea  . Depression   . Anxiety   . GERD (gastroesophageal reflux disease)     occ  . Hepatitis     :dating someone with hepatitis C did not know until broke up 6 months ago"   Past Surgical History  Procedure Laterality Date  . Rotator cuff surgery Left 14  . Hernia repair  93    umbilical, groin as child  . Cholecystectomy  10  . Appendectomy  93  . Reverse shoulder arthroplasty Left 12/13/2014    Procedure: LEFT REVERSE TOTAL SHOULDER ARTHROPLASTY;  Surgeon: Beverely LowSteve Norris, MD;  [facility];  Service: Orthopedics;  Laterality: Left;   Family History  Problem Relation Age of Onset  . Cancer Mother   . Cancer Father    Social History  Substance Use Topics  . Smoking status: Never Smoker   . Smokeless tobacco:  Never Used  . Alcohol Use: No    Review of Systems  Constitutional: Negative for fever.  Musculoskeletal: Positive for back pain.  Skin: Negative for rash and wound.  Neurological: Negative for numbness.      Allergies  Penicillins and Vicodin  Home Medications   Prior to Admission medications   Medication Sig Start Date End Date Taking? Authorizing Provider  allopurinol (ZYLOPRIM) 300 MG tablet Take 300 mg by mouth daily.   Yes Historical Provider, MD  FLUoxetine (PROZAC) 20 MG tablet Take 60 mg by mouth daily.   Yes Historical Provider, MD  QUEtiapine (SEROQUEL) 100 MG tablet Take 100 mg by mouth at bedtime.   Yes Historical Provider, MD  traZODone (DESYREL) 50 MG tablet Take 50 mg by mouth at bedtime.   Yes Historical Provider, MD   BP 119/59 mmHg  Pulse 84  Temp(Src) 97.9 F (36.6 C) (Oral)  Resp 18  Ht 5\' 8"  (1.727 m)  Wt 75.751 kg  BMI 25.40 kg/m2  SpO2 100% Physical Exam  Constitutional: He appears well-developed and well-nourished. No distress.  HENT:  Head: Atraumatic.  Eyes: Conjunctivae are normal.  Neck: Neck supple.  Abdominal: Soft. There is no tenderness.  Musculoskeletal: He exhibits tenderness (Tenderness lumbar paralumbar spinal muscle on palpation without any crepitus or step-off noted. No overlying skin changes.).  Neurological: He is alert.  5 out 5 strength to bilateral lower extremities with intact patellar deep tendon reflex and no foot drops. Able to ambulate.  Skin: No rash noted.  Psychiatric: He has a normal mood and affect.  Nursing note and vitals reviewed.   ED Course  Procedures (including critical care time)   MDM   Final diagnoses:  Low back strain, initial encounter    BP 119/59 mmHg  Pulse 84  Temp(Src) 97.9 F (36.6 C) (Oral)  Resp 18  Ht  (1.727 m)  Wt 75.751 kg  BMI 25.40 kg/m2  SpO2 100%   Patient with back pain.  No neurological deficits and normal neuro exam.  Patient is ambulatory.  No loss of bowel  or bladder control.  No concern for cauda equina.  No fever, night sweats, weight loss, h/o cancer, IVDA, no recent procedure to back. No urinary symptoms suggestive of UTI.  Supportive care and return precaution discussed. Appears safe for discharge at this time. Follow up as indicated in discharge paperwork.    Fayrene Helper, PA-C 10/31/15 1811  Azalia Bilis, MD 11/01/15 [phone]

## 2015-10-31 NOTE — ED Notes (Signed)
Pt reports bilateral mid/lower back pain. Pt states he has done recent heavy lifting. Pt also has hx of DDD. Reports increased pain with movement. Pt ambulatory.

## 2015-12-05 ENCOUNTER — Encounter (HOSPITAL_COMMUNITY): Payer: Self-pay | Admitting: *Deleted

## 2015-12-05 ENCOUNTER — Emergency Department (HOSPITAL_COMMUNITY)
Admission: EM | Admit: 2015-12-05 | Discharge: 2015-12-05 | Disposition: A | Discharge status: Home / Self Care | Payer: Medicare Other | Attending: Emergency Medicine | Admitting: Emergency Medicine

## 2015-12-05 DIAGNOSIS — Z79899 Other long term (current) drug therapy: Secondary | ICD-10-CM | POA: Diagnosis not present

## 2015-12-05 DIAGNOSIS — F329 Major depressive disorder, single episode, unspecified: Secondary | ICD-10-CM | POA: Diagnosis not present

## 2015-12-05 DIAGNOSIS — T63484A Toxic effect of venom of other arthropod, undetermined, initial encounter: Secondary | ICD-10-CM | POA: Insufficient documentation

## 2015-12-05 DIAGNOSIS — R1110 Vomiting, unspecified: Secondary | ICD-10-CM

## 2015-12-05 DIAGNOSIS — R197 Diarrhea, unspecified: Secondary | ICD-10-CM | POA: Insufficient documentation

## 2015-12-05 DIAGNOSIS — M1990 Unspecified osteoarthritis, unspecified site: Secondary | ICD-10-CM | POA: Diagnosis not present

## 2015-12-05 LAB — COMPREHENSIVE METABOLIC PANEL
ALBUMIN: 4 g/dL (ref 3.5–5.0)
ALK PHOS: 53 U/L (ref 38–126)
ALT: 11 U/L — ABNORMAL LOW (ref 17–63)
ANION GAP: 6 (ref 5–15)
AST: 23 U/L (ref 15–41)
BILIRUBIN TOTAL: 2.5 mg/dL — AB (ref 0.3–1.2)
BUN: 12 mg/dL (ref 6–20)
CALCIUM: 8.6 mg/dL — AB (ref 8.9–10.3)
CO2: 28 mmol/L (ref 22–32)
CREATININE: 0.82 mg/dL (ref 0.61–1.24)
Chloride: 103 mmol/L (ref 101–111)
GFR calc Af Amer: >60 mL/min (ref >60)
GFR calc non Af Amer: >60 mL/min (ref >60)
GLUCOSE: 94 mg/dL (ref 65–99)
Potassium: 3.5 mmol/L (ref 3.5–5.1)
SODIUM: 137 mmol/L (ref 135–145)
TOTAL PROTEIN: 6.9 g/dL (ref 6.5–8.1)

## 2015-12-05 LAB — URINALYSIS, ROUTINE W REFLEX MICROSCOPIC
Bilirubin Urine: NEGATIVE
GLUCOSE, UA: NEGATIVE mg/dL
Hgb urine dipstick: NEGATIVE
Ketones, ur: NEGATIVE mg/dL
LEUKOCYTES UA: NEGATIVE
Nitrite: NEGATIVE
PH: 5.5 (ref 5.0–8.0)
Protein, ur: NEGATIVE mg/dL
SPECIFIC GRAVITY, URINE: 1.015 (ref 1.005–1.030)

## 2015-12-05 LAB — CBC
HEMATOCRIT: 37.1 % — AB (ref 39.0–52.0)
HEMOGLOBIN: 12.7 g/dL — AB (ref 13.0–17.0)
MCH: 28.7 pg (ref 26.0–34.0)
MCHC: 34.2 g/dL (ref 30.0–36.0)
MCV: 83.7 fL (ref 78.0–100.0)
Platelets: 212 10*3/uL (ref 150–400)
RBC: 4.43 MIL/uL (ref 4.22–5.81)
RDW: 13 % (ref 11.5–15.5)
WBC: 4.6 10*3/uL (ref 4.0–10.5)

## 2015-12-05 LAB — LIPASE, BLOOD: Lipase: 30 U/L (ref 11–51)

## 2015-12-05 MED ORDER — ONDANSETRON HCL 4 MG/2ML IJ SOLN
4.0000 mg | Freq: Once | INTRAMUSCULAR | Status: AC
  Administered 2015-12-05: 4 mg via INTRAVENOUS
  Filled 2015-12-05: qty 2

## 2015-12-05 MED ORDER — DOXYCYCLINE HYCLATE 100 MG PO CAPS: 100.0000 mg | ORAL_CAPSULE | Freq: Two times a day (BID) | ORAL | Status: DC

## 2015-12-05 MED ORDER — SODIUM CHLORIDE 0.9 % IV BOLUS (SEPSIS)
1000.0000 mL | Freq: Once | INTRAVENOUS | Status: AC
  Administered 2015-12-05: 1000 mL via INTRAVENOUS

## 2015-12-05 MED ORDER — ONDANSETRON 8 MG PO TBDP: 8.0000 mg | ORAL_TABLET | Freq: Three times a day (TID) | ORAL | Status: DC | PRN

## 2015-12-05 NOTE — ED Notes (Signed)
Pt comes in for vomiting and diarrhea starting today. Pt states he was bit by and insect last night on his left arm. Pt has redness noted to this area. NAD noted.

## 2015-12-05 NOTE — ED Provider Notes (Signed)
CSN: [id number]     Arrival date & time 12/05/15  1835 History   First MD Initiated Contact with Patient 12/05/15 1949     Chief Complaint  Patient presents with  . Emesis  . Diarrhea    HPI Patient presents to the emergency room for evaluation of vomiting and diarrhea. Symptoms initially started after he was stung or bitten by some type of insect last evening. He was outside walking his dog when it was dark. He was stung by something on the left forearm. He is not sure what it was. He said some aches and persistent pain in that arm since the sting. Today he started having some vomiting as well as diarrhea. He's had some abdominal cramping. He's had 3 or 4 episodes of both vomiting and diarrhea. He denies any fevers. No chest pain or shortness of breath. He denies any hives Past Medical History  Diagnosis Date  . Arthritis   . Gout   . Gout   . Arthritis   . DDD (degenerative disc disease), lumbar   . Complication of anesthesia   . PONV (postoperative nausea and vomiting)     nausea  . Depression   . Anxiety   . GERD (gastroesophageal reflux disease)     occ  . Hepatitis     :dating someone with hepatitis C did not know until broke up 6 months ago"   Past Surgical History  Procedure Laterality Date  . Rotator cuff surgery Left 14  . Hernia repair  93    umbilical, groin as child  . Cholecystectomy  10  . Appendectomy  93  . Reverse shoulder arthroplasty Left 12/13/2014    Procedure: LEFT REVERSE TOTAL SHOULDER ARTHROPLASTY;  Surgeon: Beverely LowSteve Norris, MD;  [facility];  Service: Orthopedics;  Laterality: Left;   Family History  Problem Relation Age of Onset  . Cancer Mother   . Cancer Father    Social History  Substance Use Topics  . Smoking status: Never Smoker   . Smokeless tobacco: Never Used  . Alcohol Use: No    Review of Systems  All other systems reviewed and are negative.     Allergies  Penicillins and Vicodin  Home Medications   Prior to Admission  medications   Medication Sig Start Date End Date Taking? Authorizing Provider  allopurinol (ZYLOPRIM) 300 MG tablet Take 300 mg by mouth daily.   Yes Historical Provider, MD  colchicine 0.6 MG tablet Take 0.6 mg by mouth 2 (two) times daily.   Yes Historical Provider, MD  FLUoxetine (PROZAC) 20 MG tablet Take 60 mg by mouth daily.   Yes Historical Provider, MD  QUEtiapine (SEROQUEL) 100 MG tablet Take 100 mg by mouth at bedtime.   Yes Historical Provider, MD  traZODone (DESYREL) 50 MG tablet Take 50 mg by mouth at bedtime as needed for sleep.    Yes Historical Provider, MD  doxycycline (VIBRAMYCIN) 100 MG capsule Take 1 capsule (100 mg total) by mouth 2 (two) times daily. 12/05/15   Linwood DibblesJon Khaleelah Yowell, MD  ibuprofen (ADVIL,MOTRIN) 800 MG tablet Take 1 tablet (800 mg total) by mouth every 8 (eight) hours as needed for moderate pain. Patient not taking: Reported on 12/05/2015 10/31/15   Fayrene HelperBowie Tran, PA-C  methocarbamol (ROBAXIN) 500 MG tablet Take 1 tablet (500 mg total) by mouth 2 (two) times daily. Patient not taking: Reported on 12/05/2015 10/31/15   Fayrene HelperBowie Tran, PA-C  ondansetron (ZOFRAN ODT) 8 MG disintegrating tablet Take 1 tablet (8  mg total) by mouth every 8 (eight) hours as needed for nausea or vomiting. 12/05/15   Linwood Dibbles, MD   BP 117/75 mmHg  Pulse 60  Temp(Src) 98.2 F (36.8 C) (Oral)  Resp 18  Ht  (1.727 m)  Wt 75.751 kg  BMI 25.40 kg/m2  SpO2 100% Physical Exam  Constitutional: He appears well-developed and well-nourished. No distress.  HENT:  Head: Normocephalic and atraumatic.  Right Ear: External ear normal.  Left Ear: External ear normal.  Eyes: Conjunctivae are normal. Right eye exhibits no discharge. Left eye exhibits no discharge. No scleral icterus.  Neck: Neck supple. No tracheal deviation present.  Cardiovascular: Normal rate, regular rhythm and intact distal pulses.   Pulmonary/Chest: Effort normal and breath sounds normal. No stridor. No respiratory distress. He has no  wheezes. He has no rales.  Abdominal: Soft. Bowel sounds are normal. He exhibits no distension. There is no tenderness. There is no rebound and no guarding.  Musculoskeletal: He exhibits no edema.  Area of mild erythema on the left mid forearm, no pustules, no lymphangitic streaking  Neurological: He is alert. He has normal strength. No cranial nerve deficit (no facial droop, extraocular movements intact, no slurred speech) or sensory deficit. He exhibits normal muscle tone. He displays no seizure activity. Coordination normal.  Skin: Skin is warm and dry. No rash noted.  Psychiatric: He has a normal mood and affect.  Nursing note and vitals reviewed.   ED Course  Procedures (including critical care time)  Medications  sodium chloride 0.9 % bolus 1,000 mL (0 mLs Intravenous Stopped 12/05/15 2131)  ondansetron (ZOFRAN) injection 4 mg (4 mg Intravenous Given 12/05/15 2029)    Labs Review Labs Reviewed  COMPREHENSIVE METABOLIC PANEL - Abnormal; Notable for the following:    Calcium 8.6 (*)    ALT 11 (*)    Total Bilirubin 2.5 (*)    All other components within normal limits  CBC - Abnormal; Notable for the following:    Hemoglobin 12.7 (*)    HCT 37.1 (*)    All other components within normal limits  LIPASE, BLOOD  URINALYSIS, ROUTINE W REFLEX MICROSCOPIC (NOT AT [facility])      MDM   Final diagnoses:  Vomiting and diarrhea  Insect sting, undetermined intent, initial encounter   Patient presents to the emergency room with complaints of vomiting and diarrhea that started after an insect sting. Symptoms improved with IV fluids and antinausea medications. Laboratory tests are reassuring.  I doubt that the arm swelling is related to a cellulitis since the sting was just yesterday. I instructed the patient to monitor the swelling and redness. If it is progressing and getting worse over the weekend I will have him start taking doxycycline. Otherwise follow-up with his primary doctor next  week.    Linwood Dibbles, MD 12/05/15 2153

## 2016-03-12 DIAGNOSIS — M0520 Rheumatoid vasculitis with rheumatoid arthritis of unspecified site: Secondary | ICD-10-CM | POA: Insufficient documentation

## 2016-03-12 DIAGNOSIS — M5136 Other intervertebral disc degeneration, lumbar region: Secondary | ICD-10-CM | POA: Insufficient documentation

## 2016-03-12 DIAGNOSIS — M47816 Spondylosis without myelopathy or radiculopathy, lumbar region: Secondary | ICD-10-CM | POA: Insufficient documentation

## 2016-03-12 DIAGNOSIS — M1A9XX0 Chronic gout, unspecified, without tophus (tophi): Secondary | ICD-10-CM | POA: Insufficient documentation

## 2016-03-12 DIAGNOSIS — M5416 Radiculopathy, lumbar region: Secondary | ICD-10-CM | POA: Insufficient documentation

## 2016-03-26 DIAGNOSIS — M79604 Pain in right leg: Secondary | ICD-10-CM | POA: Insufficient documentation

## 2016-05-11 DIAGNOSIS — Z981 Arthrodesis status: Secondary | ICD-10-CM | POA: Insufficient documentation

## 2016-06-17 ENCOUNTER — Ambulatory Visit (HOSPITAL_COMMUNITY)
Admission: RE | Admit: 2016-06-17 | Discharge: 2016-06-17 | Disposition: A | Discharge status: Home / Self Care | Payer: Medicare Other | Source: Ambulatory Visit | Attending: Family Medicine | Admitting: Family Medicine

## 2016-06-17 ENCOUNTER — Other Ambulatory Visit (HOSPITAL_COMMUNITY): Payer: Self-pay | Admitting: Family Medicine

## 2016-06-17 DIAGNOSIS — M79672 Pain in left foot: Secondary | ICD-10-CM | POA: Diagnosis present

## 2016-06-17 DIAGNOSIS — M25572 Pain in left ankle and joints of left foot: Secondary | ICD-10-CM

## 2016-07-08 ENCOUNTER — Encounter (HOSPITAL_COMMUNITY): Payer: Self-pay | Admitting: Emergency Medicine

## 2016-07-08 ENCOUNTER — Emergency Department (HOSPITAL_COMMUNITY)
Admission: EM | Admit: 2016-07-08 | Discharge: 2016-07-08 | Disposition: A | Discharge status: Home / Self Care | Payer: Medicare Other | Attending: Emergency Medicine | Admitting: Emergency Medicine

## 2016-07-08 DIAGNOSIS — M549 Dorsalgia, unspecified: Secondary | ICD-10-CM | POA: Diagnosis not present

## 2016-07-08 DIAGNOSIS — M10072 Idiopathic gout, left ankle and foot: Secondary | ICD-10-CM | POA: Insufficient documentation

## 2016-07-08 DIAGNOSIS — Z79899 Other long term (current) drug therapy: Secondary | ICD-10-CM | POA: Insufficient documentation

## 2016-07-08 DIAGNOSIS — M7989 Other specified soft tissue disorders: Secondary | ICD-10-CM | POA: Diagnosis present

## 2016-07-08 DIAGNOSIS — M109 Gout, unspecified: Secondary | ICD-10-CM

## 2016-07-08 MED ORDER — PREDNISONE 10 MG PO TABS: ORAL_TABLET | ORAL | 0 refills | Status: DC

## 2016-07-08 MED ORDER — PREDNISONE 50 MG PO TABS
60.0000 mg | ORAL_TABLET | Freq: Once | ORAL | Status: AC
  Administered 2016-07-08: 60 mg via ORAL
  Filled 2016-07-08: qty 1

## 2016-07-08 NOTE — ED Triage Notes (Signed)
Back surg 04/26/16. States has had numbness and tingling down left leg with "bad" left foot pain. States takes his percocets around the clock with no relief. Engineer, civil (consulting)tates surgeon apt is not until next week. Nad. Mild swelling to lateral ankle.

## 2016-07-12 NOTE — ED Provider Notes (Signed)
[facility]-EMERGENCY DEPT Provider Note   CSN: [id number] Arrival date & time: 07/08/16  1005     History   Chief Complaint Chief Complaint  Patient presents with  . Foot Pain    HPI Jared Beck is a 55 y.o. male presenting with a history of left ankle pain and swelling which he states is a flare up of his gout.  He most recently underwent lumbar fusion by a neurosurgeon with [facility] in October and reports back pain and radiculopathy is resolved.  He denies fevers, chills, injury to the foot or ankle. He is taking his percocet left over from his surgery but his pain persists.     The history is provided by the patient.    Past Medical History:  Diagnosis Date  . Anxiety   . Arthritis   . Arthritis   . Complication of anesthesia   . DDD (degenerative disc disease), lumbar   . Depression   . GERD (gastroesophageal reflux disease)    occ  . Gout   . Gout   . Hepatitis    :dating someone with hepatitis C did not know until broke up 6 months ago"  . PONV (postoperative nausea and vomiting)    nausea    Patient Active Problem List   Diagnosis Date Noted  . 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 Complete rupture of rotator cuff 04/27/2013  . Pain in joint, shoulder region 04/27/2013  . Muscle weakness (generalized) 04/27/2013    Past Surgical History:  Procedure Laterality Date  . APPENDECTOMY  93  . BACK SURGERY    . CHOLECYSTECTOMY  10  . HERNIA REPAIR  93   umbilical, groin as child  . REVERSE SHOULDER ARTHROPLASTY Left 12/13/2014   Procedure: LEFT REVERSE TOTAL SHOULDER ARTHROPLASTY;  Surgeon: Beverely LowSteve Norris, MD;  [facility];  Service: Orthopedics;  Laterality: Left;  . rotator cuff surgery Left 14       Home Medications    Prior to Admission medications   Medication Sig Start Date End Date Taking? Authorizing Provider  allopurinol (ZYLOPRIM) 300 MG tablet Take 300 mg by mouth daily.    Historical Provider, MD  colchicine 0.6 MG tablet Take 0.6  mg by mouth 2 (two) times daily.    Historical Provider, MD  doxycycline (VIBRAMYCIN) 100 MG capsule Take 1 capsule (100 mg total) by mouth 2 (two) times daily. 12/05/15   Linwood DibblesJon Knapp, MD  FLUoxetine (PROZAC) 20 MG tablet Take 60 mg by mouth daily.    Historical Provider, MD  ibuprofen (ADVIL,MOTRIN) 800 MG tablet Take 1 tablet (800 mg total) by mouth every 8 (eight) hours as needed for moderate pain. Patient not taking: Reported on 12/05/2015 10/31/15   Fayrene HelperBowie Tran, PA-C  methocarbamol (ROBAXIN) 500 MG tablet Take 1 tablet (500 mg total) by mouth 2 (two) times daily. Patient not taking: Reported on 12/05/2015 10/31/15   Fayrene HelperBowie Tran, PA-C  ondansetron (ZOFRAN ODT) 8 MG disintegrating tablet Take 1 tablet (8 mg total) by mouth every 8 (eight) hours as needed for nausea or vomiting. 12/05/15   Linwood DibblesJon Knapp, MD  predniSONE (DELTASONE) 10 MG tablet 6, 5, 4, 3, 2 then 1 tablet by mouth daily for 6 days total. 07/09/16   Burgess AmorJulie Kharlie Bring, PA-C  QUEtiapine (SEROQUEL) 100 MG tablet Take 100 mg by mouth at bedtime.    Historical Provider, MD  traZODone (DESYREL) 50 MG tablet Take 50 mg by mouth at bedtime as needed for sleep.     Historical  Provider, MD    Family History Family History  Problem Relation Age of Onset  . Cancer Mother   . Cancer Father     Social History Social History  Substance Use Topics  . Smoking status: Never Smoker  . Smokeless tobacco: Never Used  . Alcohol use No     Allergies   Penicillins and Vicodin [hydrocodone-acetaminophen]   Review of Systems Review of Systems  Constitutional: Negative for fever.  Respiratory: Negative for shortness of breath.   Cardiovascular: Negative for chest pain and leg swelling.  Gastrointestinal: Negative for abdominal distention, abdominal pain and constipation.  Genitourinary: Negative for difficulty urinating, dysuria, flank pain, frequency and urgency.  Musculoskeletal: Positive for back pain. Negative for gait problem and joint swelling.    Skin: Negative for rash.  Neurological: Positive for numbness. Negative for weakness.     Physical Exam Updated Vital Signs BP 112/79 (BP Location: Right Arm)   Pulse 95   Temp 97.7 F (36.5 C) (Oral)   Resp 16   Ht 5\' 7"  (1.702 m)   Wt 73.5 kg   SpO2 100%   BMI 25.37 kg/m   Physical Exam  Constitutional: He appears well-developed and well-nourished.  HENT:  Head: Normocephalic.  Cardiovascular: Normal rate and intact distal pulses.  Exam reveals no decreased pulses.   Pulses:      Dorsalis pedis pulses are 2+ on the right side, and 2+ on the left side.       Posterior tibial pulses are 2+ on the right side, and 2+ on the left side.  Musculoskeletal: He exhibits edema and tenderness.       Right ankle: He exhibits normal range of motion, no swelling, no ecchymosis and normal pulse. No tenderness. No head of 5th metatarsal and no proximal fibula tenderness found.       Left ankle: He exhibits decreased range of motion and swelling. Tenderness. Lateral malleolus tenderness found.  ttp with mild erythema and increased warmth left lateral ankle.  Skin intact.  Dorsalis pedal pulse full.  Neurological: He is alert. No sensory deficit.  Skin: Skin is warm, dry and intact.  Nursing note and vitals reviewed.    ED Treatments / Results  Labs (all labs ordered are listed, but only abnormal results are displayed) Labs Reviewed - No data to display  EKG  EKG Interpretation None       Radiology No results found.  Procedures Procedures (including critical care time)  Medications Ordered in ED Medications  predniSONE (DELTASONE) tablet 60 mg (60 mg Oral Given 07/08/16 1158)     Initial Impression / Assessment and Plan / ED Course  I have reviewed the triage vital signs and the nursing notes.  Pertinent labs & imaging results that were available during my care of the patient were reviewed by me and considered in my medical decision making (see chart for  details).  Clinical Course     Heat, elevation, f/u with pcp prn if sx persist.  Final Clinical Impressions(s) / ED Diagnoses   Final diagnoses:  Acute gout of left ankle, unspecified cause    New Prescriptions Discharge Medication List as of 07/08/2016 11:53 AM    START taking these medications   Details  predniSONE (DELTASONE) 10 MG tablet 6, 5, 4, 3, 2 then 1 tablet by mouth daily for 6 days total., Print         Burgess AmorJulie Vasco Chong, PA-C 07/12/16 1916    Bethann BerkshireJoseph Zammit, MD 07/20/16 1252

## 2016-09-30 ENCOUNTER — Ambulatory Visit
Admission: RE | Admit: 2016-09-30 | Discharge: 2016-09-30 | Disposition: A | Discharge status: Home / Self Care | Payer: Medicare Other | Source: Ambulatory Visit | Attending: Neurosurgery | Admitting: Neurosurgery

## 2016-09-30 ENCOUNTER — Other Ambulatory Visit: Payer: Self-pay | Admitting: Neurosurgery

## 2016-09-30 DIAGNOSIS — M4186 Other forms of scoliosis, lumbar region: Secondary | ICD-10-CM

## 2016-12-20 ENCOUNTER — Encounter (HOSPITAL_COMMUNITY): Payer: Self-pay

## 2016-12-20 ENCOUNTER — Emergency Department (HOSPITAL_COMMUNITY): Payer: Medicare Other

## 2016-12-20 ENCOUNTER — Emergency Department (HOSPITAL_COMMUNITY)
Admission: EM | Admit: 2016-12-20 | Discharge: 2016-12-20 | Disposition: A | Discharge status: Other Acute Inpatient Hospital | Payer: Medicare Other | Attending: Emergency Medicine | Admitting: Emergency Medicine

## 2016-12-20 DIAGNOSIS — R509 Fever, unspecified: Secondary | ICD-10-CM | POA: Diagnosis not present

## 2016-12-20 DIAGNOSIS — Z5181 Encounter for therapeutic drug level monitoring: Secondary | ICD-10-CM | POA: Insufficient documentation

## 2016-12-20 DIAGNOSIS — T814XXA Infection following a procedure, initial encounter: Secondary | ICD-10-CM | POA: Diagnosis not present

## 2016-12-20 DIAGNOSIS — R5082 Postprocedural fever: Secondary | ICD-10-CM | POA: Diagnosis not present

## 2016-12-20 DIAGNOSIS — T8140XA Infection following a procedure, unspecified, initial encounter: Secondary | ICD-10-CM

## 2016-12-20 DIAGNOSIS — Z79899 Other long term (current) drug therapy: Secondary | ICD-10-CM | POA: Diagnosis not present

## 2016-12-20 LAB — TYPE AND SCREEN
ABO/RH(D): O NEG
Antibody Screen: NEGATIVE

## 2016-12-20 LAB — URINALYSIS, ROUTINE W REFLEX MICROSCOPIC
BILIRUBIN URINE: NEGATIVE
Glucose, UA: NEGATIVE mg/dL
HGB URINE DIPSTICK: NEGATIVE
Ketones, ur: NEGATIVE mg/dL
Leukocytes, UA: NEGATIVE
Nitrite: NEGATIVE
Protein, ur: NEGATIVE mg/dL
SPECIFIC GRAVITY, URINE: 1.02 (ref 1.005–1.030)
pH: 7 (ref 5.0–8.0)

## 2016-12-20 LAB — CBC WITH DIFFERENTIAL/PLATELET
BASOS ABS: 0 10*3/uL (ref 0.0–0.1)
Basophils Relative: 0 %
EOS PCT: 1 %
Eosinophils Absolute: 0 10*3/uL (ref 0.0–0.7)
HEMATOCRIT: 26 % — AB (ref 39.0–52.0)
Hemoglobin: 8.2 g/dL — ABNORMAL LOW (ref 13.0–17.0)
LYMPHS PCT: 16 %
Lymphs Abs: 0.8 10*3/uL (ref 0.7–4.0)
MCH: 25.6 pg — ABNORMAL LOW (ref 26.0–34.0)
MCHC: 31.5 g/dL (ref 30.0–36.0)
MCV: 81.3 fL (ref 78.0–100.0)
MONO ABS: 0.4 10*3/uL (ref 0.1–1.0)
Monocytes Relative: 9 %
Neutro Abs: 3.8 10*3/uL (ref 1.7–7.7)
Neutrophils Relative %: 74 %
PLATELETS: 406 10*3/uL — AB (ref 150–400)
RBC: 3.2 MIL/uL — ABNORMAL LOW (ref 4.22–5.81)
RDW: 16.3 % — ABNORMAL HIGH (ref 11.5–15.5)
WBC: 5.1 10*3/uL (ref 4.0–10.5)

## 2016-12-20 LAB — COMPREHENSIVE METABOLIC PANEL
ALBUMIN: 2.9 g/dL — AB (ref 3.5–5.0)
ALT: 8 U/L — AB (ref 17–63)
AST: 16 U/L (ref 15–41)
Alkaline Phosphatase: 74 U/L (ref 38–126)
Anion gap: 11 (ref 5–15)
BILIRUBIN TOTAL: 1.3 mg/dL — AB (ref 0.3–1.2)
BUN: 6 mg/dL (ref 6–20)
CHLORIDE: 98 mmol/L — AB (ref 101–111)
CO2: 22 mmol/L (ref 22–32)
CREATININE: 1.12 mg/dL (ref 0.61–1.24)
Calcium: 8.7 mg/dL — ABNORMAL LOW (ref 8.9–10.3)
GFR calc Af Amer: >60 mL/min (ref >60)
GLUCOSE: 106 mg/dL — AB (ref 65–99)
Potassium: 3.9 mmol/L (ref 3.5–5.1)
Sodium: 131 mmol/L — ABNORMAL LOW (ref 135–145)
Total Protein: 6.8 g/dL (ref 6.5–8.1)

## 2016-12-20 LAB — ABO/RH: ABO/RH(D): O NEG

## 2016-12-20 LAB — I-STAT CG4 LACTIC ACID, ED
LACTIC ACID, VENOUS: 1.03 mmol/L (ref 0.5–1.9)
Lactic Acid, Venous: 0.54 mmol/L (ref 0.5–1.9)

## 2016-12-20 LAB — PROTIME-INR
INR: 1.24
Prothrombin Time: 15.6 seconds — ABNORMAL HIGH (ref 11.4–15.2)

## 2016-12-20 LAB — POC OCCULT BLOOD, ED: Fecal Occult Bld: NEGATIVE

## 2016-12-20 MED ORDER — SODIUM CHLORIDE 0.9 % IV BOLUS (SEPSIS)
1000.0000 mL | Freq: Once | INTRAVENOUS | Status: AC
  Administered 2016-12-20: 1000 mL via INTRAVENOUS

## 2016-12-20 MED ORDER — VANCOMYCIN HCL IN DEXTROSE 1-5 GM/200ML-% IV SOLN: 1000.0000 mg | Freq: Once | INTRAVENOUS | Status: DC

## 2016-12-20 MED ORDER — VANCOMYCIN HCL IN DEXTROSE 1-5 GM/200ML-% IV SOLN: 1000.0000 mg | Freq: Two times a day (BID) | INTRAVENOUS | Status: DC

## 2016-12-20 MED ORDER — SODIUM CHLORIDE 0.9 % IV SOLN
2.0000 g | Freq: Once | INTRAVENOUS | Status: AC
  Administered 2016-12-20: 2 g via INTRAVENOUS
  Filled 2016-12-20: qty 2

## 2016-12-20 MED ORDER — SODIUM CHLORIDE 0.9 % IV SOLN
1000.0000 mL | INTRAVENOUS | Status: DC
  Administered 2016-12-20: 1000 mL via INTRAVENOUS

## 2016-12-20 MED ORDER — MORPHINE SULFATE (PF) 4 MG/ML IV SOLN
4.0000 mg | Freq: Once | INTRAVENOUS | Status: AC
  Administered 2016-12-20: 4 mg via INTRAVENOUS
  Filled 2016-12-20: qty 1

## 2016-12-20 MED ORDER — SODIUM CHLORIDE 0.9 % IV BOLUS (SEPSIS)
250.0000 mL | Freq: Once | INTRAVENOUS | Status: AC
  Administered 2016-12-20: 250 mL via INTRAVENOUS

## 2016-12-20 MED ORDER — VANCOMYCIN HCL 10 G IV SOLR
1750.0000 mg | Freq: Once | INTRAVENOUS | Status: AC
  Administered 2016-12-20: 1750 mg via INTRAVENOUS
  Filled 2016-12-20: qty 1750

## 2016-12-20 MED ORDER — IOPAMIDOL (ISOVUE-300) INJECTION 61%
INTRAVENOUS | Status: AC
  Administered 2016-12-20: 100 mL
  Filled 2016-12-20: qty 100

## 2016-12-20 MED ORDER — SODIUM CHLORIDE 0.9 % IV SOLN
1.0000 g | Freq: Three times a day (TID) | INTRAVENOUS | Status: DC
  Filled 2016-12-20 (×2): qty 1

## 2016-12-20 NOTE — ED Notes (Signed)
ED Provider at bedside. 

## 2016-12-20 NOTE — ED Triage Notes (Signed)
Pt from home c.o fever, hypotension and increased drainage from incision site. Pt had back surgery on 4/23 and reports increased drainage and two of his incisions "not healing right" Pt completed antibiotics 2 weeks ago. Pt sts he started having a fever a week ago. Pt has physical therapy weekly and they reported a temp of 102.0 today. Pt also hypotensive at 90/56 while standing and becomes very dizzy upon standing. 20G L FA with 400ml NS. HR 110, rr 12. nad pt alert and oriented

## 2016-12-20 NOTE — ED Notes (Signed)
CT called to get CD of todays CT scans

## 2016-12-20 NOTE — ED Notes (Signed)
Patient transported to X-ray 

## 2016-12-20 NOTE — ED Provider Notes (Signed)
[facility]-EMERGENCY DEPT Provider Note   CSN: [id number] Arrival date & time: 12/20/16  1552     History   Chief Complaint Chief Complaint  Patient presents with  . Fever  . Hypotension  . Drainage from Incision    lumbar spine    HPI Jared Beck is a 56 y.o. male.  HPI Patient presents with fever and lightheadedness. Has had infections and slow healing wound on his lumbar spine after a fusion at the end of April and no vomiting. Has recently been on clindamycin. States since the surgery has had his blood pressure lower than it previously was. However reviewing the clinic notes it appears to be normal on those visits. States now is been more dizzy. States he's had increased drainage out of his back. States the back does not feel any different than it has what has had more drainage. No cough. No dysuria. No weakness in his legs. 6 pain and weakness has improved since surgery. Surgery was done of onset would rather be admitted to the hospital here. Patient states he's been told that he needs a wound VAC and also told that he does not need a wound VAC for the wound. Past Medical History:  Diagnosis Date  . Anxiety   . Arthritis   . Arthritis   . Complication of anesthesia   . DDD (degenerative disc disease), lumbar   . Depression   . GERD (gastroesophageal reflux disease)    occ  . Gout   . Gout   . PONV (postoperative nausea and vomiting)    nausea    Patient Active Problem List   Diagnosis Date Noted  . 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 Complete rupture of rotator cuff 04/27/2013  . Pain in joint, shoulder region 04/27/2013  . Muscle weakness (generalized) 04/27/2013    Past Surgical History:  Procedure Laterality Date  . APPENDECTOMY  93  . BACK SURGERY    . CHOLECYSTECTOMY  10  . HERNIA REPAIR  89   umbilical, groin as child  . REVERSE SHOULDER ARTHROPLASTY Left 12/13/2014   Procedure: LEFT REVERSE TOTAL SHOULDER ARTHROPLASTY;  Surgeon: Beverely Low, MD;   Location: [facility] OR;  Service: Orthopedics;  Laterality: Left;  . rotator cuff surgery Left 14       Home Medications    Prior to Admission medications   Medication Sig Start Date End Date Taking? Authorizing Provider  allopurinol (ZYLOPRIM) 300 MG tablet Take 300 mg by mouth daily.   Yes [provider]  Cholecalciferol (VITAMIN D3) 5000 units CAPS Take 5,000 Units by mouth daily.   Yes [provider]  colchicine 0.6 MG tablet Take 0.6 mg by mouth as needed (gout).    Yes [provider]  gabapentin (NEURONTIN) 300 MG capsule Take 300 mg by mouth 3 (three) times daily.   Yes [provider]  oxyCODONE-acetaminophen (PERCOCET/ROXICET) 5-325 MG tablet Take 1 tablet by mouth 4 (four) times daily. 11/04/16  Yes [provider]  pramipexole (MIRAPEX) 0.5 MG tablet Take 0.5 mg by mouth every evening.   Yes [provider]  doxycycline (VIBRAMYCIN) 100 MG capsule Take 1 capsule (100 mg total) by mouth 2 (two) times daily. Patient not taking: Reported on 12/20/2016 12/05/15   Linwood Dibbles, MD  FLUoxetine (PROZAC) 20 MG tablet Take 60 mg by mouth daily.    [provider]  ibuprofen (ADVIL,MOTRIN) 800 MG tablet Take 1 tablet (800 mg total) by mouth every 8 (eight) hours as needed for  moderate pain. Patient not taking: Reported on 12/05/2015 10/31/15   Fayrene Helper, PA-C  methocarbamol (ROBAXIN) 500 MG tablet Take 1 tablet (500 mg total) by mouth 2 (two) times daily. Patient not taking: Reported on 12/20/2016 10/31/15   Fayrene Helper, PA-C  ondansetron (ZOFRAN ODT) 8 MG disintegrating tablet Take 1 tablet (8 mg total) by mouth every 8 (eight) hours as needed for nausea or vomiting. Patient not taking: Reported on 12/20/2016 12/05/15   Linwood Dibbles, MD  predniSONE (DELTASONE) 10 MG tablet 6, 5, 4, 3, 2 then 1 tablet by mouth daily for 6 days total. Patient not taking: Reported on 12/20/2016 07/09/16   Burgess Amor, PA-C  QUEtiapine (SEROQUEL) 100 MG tablet Take  100 mg by mouth at bedtime.    [provider]  traZODone (DESYREL) 50 MG tablet Take 50 mg by mouth at bedtime as needed for sleep.     [provider]    Family History Family History  Problem Relation Age of Onset  . Cancer Mother   . Cancer Father   . Heart disease Father   . Arrhythmia Brother        Needs pacer  . Stroke Brother     Social History Social History  Substance Use Topics  . Smoking status: Never Smoker  . Smokeless tobacco: Never Used  . Alcohol use No     Allergies   Penicillins and Vicodin [hydrocodone-acetaminophen]   Review of Systems Review of Systems  Constitutional: Positive for chills and fever. Negative for appetite change.  HENT: Negative for congestion.   Respiratory: Negative for choking and shortness of breath.   Cardiovascular: Negative for chest pain.  Gastrointestinal: Negative for abdominal pain.  Genitourinary: Negative for flank pain.  Musculoskeletal: Negative for back pain.  Skin: Negative for wound.  Neurological: Positive for light-headedness.  Hematological: Negative for adenopathy.  Psychiatric/Behavioral: Negative for confusion.     Physical Exam Updated Vital Signs BP 110/86   Pulse (!) 119   Temp 100 F (37.8 C) (Oral)   Resp 15   Ht 5\' 7"  (1.702 m)   Wt 73.9 kg (163 lb)   SpO2 96%   BMI 25.53 kg/m   Physical Exam  Constitutional: He appears well-developed.  HENT:  Head: Normocephalic.  Eyes: EOM are normal.  Neck: Neck supple.  Cardiovascular:  Mild tachycardia  Pulmonary/Chest: Effort normal.  Abdominal: There is no tenderness.  Musculoskeletal:  Stapled wound from lower thoracic spine down to pelvis. Some serous drainage inferiorly. No clear fluctuance. Only mild erythema. Neurovascular intact in feet.  Neurological: He is alert.  Skin: Skin is warm. Capillary refill takes less than 2 seconds.     ED Treatments / Results  Labs (all labs ordered are listed, but only abnormal  results are displayed) Labs Reviewed  COMPREHENSIVE METABOLIC PANEL - Abnormal; Notable for the following:       Result Value   Sodium 131 (*)    Chloride 98 (*)    Glucose, Bld 106 (*)    Calcium 8.7 (*)    Albumin 2.9 (*)    ALT 8 (*)    Total Bilirubin 1.3 (*)    All other components within normal limits  CBC WITH DIFFERENTIAL/PLATELET - Abnormal; Notable for the following:    RBC 3.20 (*)    Hemoglobin 8.2 (*)    HCT 26.0 (*)    MCH 25.6 (*)    RDW 16.3 (*)    Platelets 406 (*)    All  other components within normal limits  PROTIME-INR - Abnormal; Notable for the following:    Prothrombin Time 15.6 (*)    All other components within normal limits  URINALYSIS, ROUTINE W REFLEX MICROSCOPIC - Abnormal; Notable for the following:    APPearance HAZY (*)    All other components within normal limits  CULTURE, BLOOD (ROUTINE X 2)  CULTURE, BLOOD (ROUTINE X 2)  I-STAT CG4 LACTIC ACID, ED  POC OCCULT BLOOD, ED  I-STAT CG4 LACTIC ACID, ED  TYPE AND SCREEN  ABO/RH    EKG  EKG Interpretation None       Radiology Dg Chest 2 View  Result Date: 12/20/2016 CLINICAL DATA:  Fever.  Sepsis.  Nausea and vomiting. EXAM: CHEST  2 VIEW COMPARISON:  Radiographs dated 09/30/2016 and 07/18/2013 FINDINGS: Heart size and pulmonary vascularity are normal and the lungs are clear. Interval vertebroplasties at T9 and T10. Interval thoracolumbar fusion with slight widening of the left paraspinal line beginning at T8-9. IMPRESSION: No active cardiopulmonary disease. Slight widening of the paraspinal line in the lower thoracic spine at the site of prior surgery. This could be postoperative but given the patient's sepsis, the possibility of infection should be considered. Electronically Signed   By: Francene Boyers M.D.   On: 12/20/2016 17:25   Ct Thoracic Spine W Contrast  Result Date: 12/20/2016 CLINICAL DATA:  Fever.  Thoracolumbar fusion April 2018. EXAM: CT THORACIC SPINE WITH CONTRAST TECHNIQUE:  Multidetector CT images of thoracic was performed according to the standard protocol following intravenous contrast administration. CONTRAST:  <See Chart> ISOVUE-300 IOPAMIDOL (ISOVUE-300) INJECTION 61% COMPARISON:  Two-view chest x-ray on the same day. FINDINGS: Alignment: AP alignment is anatomic. Mild rightward curvature is centered at T6-7. Vertebrae: Vertebral augmentation is noted at T9 and T10. Vertebral body heights are maintained. Endplate changes are evident on the left at T7-8 to lesser extent at T6-7 in on the right at T3-4. Paraspinal and other soft tissues: The paraspinous soft tissues are within normal limits at T7 and above. Skin staples remain in place. The soft tissues are somewhat more ill-defined at the T10-T11 level extending to the posterior lamina. Disc levels: T12 laminectomy is noted. There bone fragments posterior to the lamina. Infection is not excluded. Osseous foraminal narrowing present bilaterally at T8-9, T9-10, T10-11, and T11-12. IMPRESSION: 1. Ill-defined soft tissue posterior to the T11 and T12 levels may be within normal limits following surgery. 2. Bone fragments are somewhat ill-defined posterior to the T12 level recent higher concern for infection. MRI of the lumbar spine without and with contrast would be better able to distinguish soft tissues. 3. Osseous foraminal narrowing bilaterally at T8-9, T9-10, and T10-11. 4. Scoliosis. Electronically Signed   By: Marin Roberts M.D.   On: 12/20/2016 21:13   Ct Lumbar Spine W Contrast  Result Date: 12/20/2016 CLINICAL DATA:  Fever.  Lumbar surgery 2018. EXAM: CT LUMBAR SPINE WITH CONTRAST TECHNIQUE: Multidetector CT imaging of the lumbar spine was performed with intravenous contrast administration. CONTRAST:  <See Chart> ISOVUE-300 IOPAMIDOL (ISOVUE-300) INJECTION 61%<Contrast><See Chart> ISOVUE-300 IOPAMIDOL (ISOVUE-300) INJECTION 61% COMPARISON:  Thoracic spine radiographs from the same day. FINDINGS: Segmentation: 5 non  rib-bearing lumbar type vertebral bodies are present. Bilateral pedicle screw and rod fixation is present in the lower thoracic spine through S1. Bilateral iliac screws are in place as well. Laminectomies are present at all levels in the lumbar spine. There is prominent soft tissue Alignment: AP alignment is anatomic. Leftward curvature of the lumbar spine is centered  at L3. Vertebrae: Chronic endplate marrow changes are present. The pedicle screws heart place from the lower thoracic spine through S1. Bilateral iliac screws are present as well. Hardware is intact. Paraspinal and other soft tissues: Extensive soft tissue swelling and fluid is present along the surgical site. There is lower density fluid and gas at the L5 level concerning for abscess. Graft material is noted along the facet joints bilaterally. Disc levels: Laminectomies and foraminotomies are performed. Osseous foraminal narrowing remains on the right at L3-4 and on the left at L4-5. The central canal is decompressed throughout the lumbar spine. IMPRESSION: 1. Extensive ill-defined soft tissue in fluid posterior to the lumbar spine. While this may represent normal postoperative changes, infection is not excluded. 2. Cast luckily at the L4-5 level associated with posterior paraspinal fluid is more concerning for abscess formation. 3. Thoracolumbar fusion as described. 4. Residual osseous foraminal narrowing on the right at the L3-4 and on the left at L4-5. 5. MRI of the lumbar spine without and with contrast could be used to better define the soft tissues and possible abscess. Electronically Signed   By: Marin Robertshristopher  Mattern M.D.   On: 12/20/2016 21:37    Procedures Procedures (including critical care time)  Medications Ordered in ED Medications  0.9 %  sodium chloride infusion (1,000 mLs Intravenous New Bag/Given 12/20/16 1640)  vancomycin (VANCOCIN) IVPB 1000 mg/200 mL premix (not administered)  meropenem (MERREM) 1 g in sodium chloride 0.9 %  100 mL IVPB (not administered)  sodium chloride 0.9 % bolus 1,000 mL (0 mLs Intravenous Stopped 12/20/16 1744)    And  sodium chloride 0.9 % bolus 1,000 mL (0 mLs Intravenous Stopped 12/20/16 2006)    And  sodium chloride 0.9 % bolus 250 mL (0 mLs Intravenous Stopped 12/20/16 1833)  meropenem (MERREM) 2 g in sodium chloride 0.9 % 100 mL IVPB (0 g Intravenous Stopped 12/20/16 1833)  vancomycin (VANCOCIN) 1,750 mg in sodium chloride 0.9 % 500 mL IVPB (0 mg Intravenous Stopped 12/20/16 2006)  iopamidol (ISOVUE-300) 61 % injection (100 mLs  Contrast Given 12/20/16 2033)  morphine 4 MG/ML injection 4 mg (4 mg Intravenous Given 12/20/16 1924)  morphine 4 MG/ML injection 4 mg (4 mg Intravenous Given 12/20/16 2141)     Initial Impression / Assessment and Plan / ED Course  I have reviewed the triage vital signs and the nursing notes.  Pertinent labs & imaging results that were available during my care of the patient were reviewed by me and considered in my medical decision making (see chart for details).     Patient resents with fever. Also anemia. Has possible infection of recent spinal surgery. Doubt intraspinal infection. Does however have fluid collections are to been draining. White count is normal. Normal lactic acid 2. Code sepsis activated given meropenem and vancomycin after discussion with pharmacy. Blood pressures improved. Discussed with Dr. Dutch QuintPoole and then Dr. Maryfrances Bunnellanford about medical admission. CT scan done and did show fluid collection. Discussed with Dr.Janjua and no vomiting since our neurosurgeon recommended continuity of care. Dr. Matthias Hughs'Angelo [facility] Also discussed with Dr. Normand Sloopillard. Will transfer.  Final Clinical Impressions(s) / ED Diagnoses   Final diagnoses:  Fever  Postoperative infection, initial encounter    New Prescriptions New Prescriptions   No medications on file     Benjiman CorePickering, Deserae Jennings, MD 12/20/16 2248

## 2016-12-20 NOTE — ED Notes (Signed)
Patient transported to CT 

## 2016-12-20 NOTE — Progress Notes (Signed)
Pharmacy Antibiotic Note  Jared Beck is a 56 y.o. male admitted on 12/20/2016 with sepsis , possible infected surgical site  Plan: Meropenem 2 g x 1 then 1 g q8h Vancomycin 1750 mg x 1 then 1 g q12h Monitor renal fx, cx, vt prn  Height: 5\' 7"  (170.2 cm) Weight: 163 lb (73.9 kg) IBW/kg (Calculated) : 66.1  Temp (24hrs), Avg:102.8 F (39.3 C), Min:102.8 F (39.3 C), Max:102.8 F (39.3 C)   Recent Labs Lab 12/20/16 1620 12/20/16 1631  WBC 5.1  --   CREATININE 1.12  --   LATICACIDVEN  --  1.03    Estimated Creatinine Clearance: 71.3 mL/min (by C-G formula based on SCr of 1.12 mg/dL).    Allergies  Allergen Reactions  . Penicillins Swelling    Has patient had a PCN reaction causing immediate rash, facial/tongue/throat swelling, SOB or lightheadedness with hypotension: Yes Has patient had a PCN reaction causing severe rash involving mucus membranes or skin necrosis: Yes Has patient had a PCN reaction that required hospitalization Yes Has patient had a PCN reaction occurring within the last 10 years: No If all of the above answers are "NO", then may proceed with Cephalosporin use.   Swelling of tongue  . Vicodin [Hydrocodone-Acetaminophen] Itching   Isaac BlissMichael Dyke Weible, PharmD, BCPS, BCCCP Clinical Pharmacist 12/20/2016 6:23 PM

## 2016-12-20 NOTE — H&P (Signed)
History and Physical  Patient Name: DEWIGHT CATINO     BZJ:[id number]    DOB: [birth year]    DOA: 12/20/2016 PCP: Lucia Gaskins, MD  Neurosurgery: Dr. Judeth Porch, [facility]    Patient coming from: Home  Chief Complaint: Fever, dizziness  HPI: Jared Beck is a 56 y.o. male with a past medical history significant for gout and DDD with recent lumbar fusion who presents with fever and hypotension.  The patient had a posterior T10-pelvis instrumented posterolateral fusion, T9-T10 vertebroplasty, T12-L1, L2-3,L3-4, L5-S1 Posterior column Osteotomies, posterolateral fusion with BMP, cadaveric allograft bone, and local autograft all at [facility] in [place] at the end of April by Dr. Higinio Plan.  Since then, he has had intermittent dizziness and subjective fevers and claims his BP has been in the 90s and 80s (although three office notes since then show BPs 110-120/80 mmHg).  He has had a persistent superficial surgical site infection, treated with clindamycin, and this appears to have gotten better.    In the last 3-4 days, his dizziness has been worse and persistent, occurring with standing, and he has had some fevers at home.  He continues to have pain in his back, drainage continues.  He has had no leg numbness, weakness, gait disturbance.  He has had no cough, sputum production.  He has had no dysuria.  Today, he was sitting on the porch, waiting for [facility] PT, when he stood up, was "so dizzy he almost passed out", and sat back down.  When [facility] got there, they checked his BP, found it was in the 90s and that he had a fever and sent him to the ER.  ED course: -Temp 102.65F, HR 90, respirations 25, BP 92/63, pulse ox normal -Na 131, K 3.9, Cr 1.12 (baseline 0.8), WBC 5.1K, Hgb 8.2 (trending up from post-surg nadir <7) -INR 1.2 -UA clear -CXR showed no focal airspace disease, question paraspinal widening -Lactic acid 1.03 -FOBT negative, no melena on  exam -Blood cultures were obtained -Case was discussed with our Neurosurgery on call who recommended CT spine to better characterize -He was administered 30 cc/kg crystalloid, vancomycin and meropenem and TRH were asked to evaluate for admission     ROS: Review of Systems  Constitutional: Positive for fever.  Respiratory: Negative for cough, sputum production and shortness of breath.   Cardiovascular: Negative for chest pain and leg swelling.  Genitourinary: Negative for dysuria, frequency and urgency.  Musculoskeletal: Positive for back pain.  Skin: Negative for itching and rash.  Neurological: Positive for dizziness. Negative for sensory change, focal weakness, loss of consciousness and weakness.  All other systems reviewed and are negative.         Past Medical History:  Diagnosis Date  . Anxiety   . Arthritis   . Arthritis   . Complication of anesthesia   . DDD (degenerative disc disease), lumbar   . Depression   . GERD (gastroesophageal reflux disease)    occ  . Gout   . Gout   . PONV (postoperative nausea and vomiting)    nausea    Past Surgical History:  Procedure Laterality Date  . APPENDECTOMY  93  . BACK SURGERY    . CHOLECYSTECTOMY  10  . HERNIA REPAIR  93   umbilical, groin as child  . REVERSE SHOULDER ARTHROPLASTY Left 12/13/2014   Procedure: LEFT REVERSE TOTAL SHOULDER ARTHROPLASTY;  Surgeon: Netta Cedars, MD;  Location: [place];  Service: Orthopedics;  Laterality: Left;  .  rotator cuff surgery Left 14    Social History: The patient walks unassisted.  Never smoker.  No alcohol.  Allergies  Allergen Reactions  . Penicillins Swelling    Has patient had a PCN reaction causing immediate rash, facial/tongue/throat swelling, SOB or lightheadedness with hypotension: Yes Has patient had a PCN reaction causing severe rash involving mucus membranes or skin necrosis: Yes Has patient had a PCN reaction that required hospitalization Yes Has patient had a PCN  reaction occurring within the last 10 years: No If all of the above answers are "NO", then may proceed with Cephalosporin use.   Swelling of tongue  . Vicodin [Hydrocodone-Acetaminophen] Itching    Family history: family history includes Arrhythmia in his brother; Cancer in his father and mother; Heart disease in his father; Stroke in his brother.  Prior to Admission medications   Medication Sig Start Date End Date Taking? Authorizing Provider  allopurinol (ZYLOPRIM) 300 MG tablet Take 300 mg by mouth daily.   Yes [provider]  Cholecalciferol (VITAMIN D3) 5000 units CAPS Take 5,000 Units by mouth daily.   Yes [provider]  colchicine 0.6 MG tablet Take 0.6 mg by mouth as needed (gout).    Yes [provider]  gabapentin (NEURONTIN) 300 MG capsule Take 300 mg by mouth 3 (three) times daily.   Yes [provider]  oxyCODONE-acetaminophen (PERCOCET/ROXICET) 5-325 MG tablet Take 1 tablet by mouth 4 (four) times daily. 11/04/16  Yes [provider]  pramipexole (MIRAPEX) 0.5 MG tablet Take 0.5 mg by mouth every evening.   Yes [provider]  doxycycline (VIBRAMYCIN) 100 MG capsule Take 1 capsule (100 mg total) by mouth 2 (two) times daily. Patient not taking: Reported on 12/20/2016 12/05/15   Dorie Rank, MD  FLUoxetine (PROZAC) 20 MG tablet Take 60 mg by mouth daily.    [provider]  ibuprofen (ADVIL,MOTRIN) 800 MG tablet Take 1 tablet (800 mg total) by mouth every 8 (eight) hours as needed for moderate pain. Patient not taking: Reported on 12/05/2015 10/31/15   Domenic Moras, PA-C  methocarbamol (ROBAXIN) 500 MG tablet Take 1 tablet (500 mg total) by mouth 2 (two) times daily. Patient not taking: Reported on 12/20/2016 10/31/15   Domenic Moras, PA-C  ondansetron (ZOFRAN ODT) 8 MG disintegrating tablet Take 1 tablet (8 mg total) by mouth every 8 (eight) hours as needed for nausea or vomiting. Patient not taking: Reported on 12/20/2016  12/05/15   Dorie Rank, MD  predniSONE (DELTASONE) 10 MG tablet 6, 5, 4, 3, 2 then 1 tablet by mouth daily for 6 days total. Patient not taking: Reported on 12/20/2016 07/09/16   Evalee Jefferson, PA-C  QUEtiapine (SEROQUEL) 100 MG tablet Take 100 mg by mouth at bedtime.    [provider]  traZODone (DESYREL) 50 MG tablet Take 50 mg by mouth at bedtime as needed for sleep.     [provider]       Physical Exam: BP 102/60   Pulse 87   Temp (!) 102.8 F (39.3 C) (Oral) Comment: Simultaneous filing. User may not have seen previous data. Comment (Src): Simultaneous filing. User may not have seen previous data.  Resp 16   Ht _0  (1.702 m)   Wt 73.9 kg (163 lb)   SpO2 100%   BMI 25.53 kg/m  General appearance: Well-developed, adult male, alert and in mild distress from malaise.   Eyes: Anicteric, conjunctiva pink, lids and lashes normal. PERRL.    ENT:  No nasal deformity, discharge, epistaxis.  Hearing normal. OP moist without lesions.   Neck: No neck masses.  Trachea midline.  No thyromegaly/tenderness. Lymph: No cervical or supraclavicular lymphadenopathy. Skin: Warm and dry.  No jaundice.  No suspicious rashes or lesions.  The back incision has moderate redness around, some drainage is evident on the bedsheet.  There are paraspinal soft nontender fluid collections on both sides, larger on the right:    Cardiac: Tachycardic, regular, nl S1-S2, no murmurs appreciated.  Capillary refill is brisk.  JVP normal.  No LE edema.  Radial and DP pulses 2+ and symmetric. Respiratory: Normal respiratory rate and rhythm.  CTAB without rales or wheezes. Abdomen: Abdomen soft.  No TTP. No ascites, distension, hepatosplenomegaly.   MSK: No deformities or effusions.  No cyanosis or clubbing. Neuro: Cranial nerves normal.  Sensation intact to light touch in legs. Speech is fluent.  Muscle strength normal in legs and arms.    Psych: Sensorium intact and responding to questions, attention  normal.  Behavior appropriate.  Affect normal.  Judgment and insight appear normal.     Labs on Admission:  I have personally reviewed following labs and imaging studies: CBC:  Recent Labs Lab 12/20/16 1620  WBC 5.1  NEUTROABS 3.8  HGB 8.2*  HCT 26.0*  MCV 81.3  PLT 704*   Basic Metabolic Panel:  Recent Labs Lab 12/20/16 1620  NA 131*  K 3.9  CL 98*  CO2 22  GLUCOSE 106*  BUN 6  CREATININE 1.12  CALCIUM 8.7*   GFR: Estimated Creatinine Clearance: 71.3 mL/min (by C-G formula based on SCr of 1.12 mg/dL).  Liver Function Tests:  Recent Labs Lab 12/20/16 1620  AST 16  ALT 8*  ALKPHOS 74  BILITOT 1.3*  PROT 6.8  ALBUMIN 2.9*   No results for input(s): LIPASE, AMYLASE in the last 168 hours. No results for input(s): AMMONIA in the last 168 hours. Coagulation Profile:  Recent Labs Lab 12/20/16 1620  INR 1.24   Cardiac Enzymes: No results for input(s): CKTOTAL, CKMB, CKMBINDEX, TROPONINI in the last 168 hours. BNP (last 3 results) No results for input(s): PROBNP in the last 8760 hours. HbA1C: No results for input(s): HGBA1C in the last 72 hours. CBG: No results for input(s): GLUCAP in the last 168 hours. Lipid Profile: No results for input(s): CHOL, HDL, LDLCALC, TRIG, CHOLHDL, LDLDIRECT in the last 72 hours. Thyroid Function Tests: No results for input(s): TSH, T4TOTAL, FREET4, T3FREE, THYROIDAB in the last 72 hours. Anemia Panel: No results for input(s): VITAMINB12, FOLATE, FERRITIN, TIBC, IRON, RETICCTPCT in the last 72 hours. Sepsis Labs: Lactic acid 1.03 Invalid input(s): PROCALCITONIN, LACTICIDVEN No results found for this or any previous visit (from the past 240 hour(s)).       Radiological Exams on Admission: Personally reviewed CXR shows no airspace disease, ?paraspinal widening: Dg Chest 2 View  Result Date: 12/20/2016 CLINICAL DATA:  Fever.  Sepsis.  Nausea and vomiting. EXAM: CHEST  2 VIEW COMPARISON:  Radiographs dated 09/30/2016 and  07/18/2013 FINDINGS: Heart size and pulmonary vascularity are normal and the lungs are clear. Interval vertebroplasties at T9 and T10. Interval thoracolumbar fusion with slight widening of the left paraspinal line beginning at T8-9. IMPRESSION: No active cardiopulmonary disease. Slight widening of the paraspinal line in the lower thoracic spine at the site of prior surgery. This could be postoperative but given the patient's sepsis, the possibility of infection should be considered. Electronically Signed   By: Lorriane Shire M.D.  On: 12/20/2016 17:25        Assessment/Plan  1. Sepsis syndrome:  Suspect source is post-operative.   -Agree with CT imaging -Recommend consultation with Neurosurgery at [facility] -Consider holding further antibiotics if hemodynamically stable, given normal lactate, WBC, to allow for targeted antibiotic therapy -Check ESR, CRP -Consult to Infectious Disease if concerns for osteomyelitis or need for operative debridement -If CT imaging shows epidural or paraspinal fluid colelction or osteomyelitis, will need transfer to [facility] for Neurosurgery consultation with his primary Neurosurgery service   2. Anemia:  -Should have iron studies, ferritin, reticulocytes and repeat FOBTs obtained   3. Hyperbilirubinemia:  Mild -Trend to normal  4. Hyponatremia:  -Should have urine sodium -Calculate free water clearance from urine/serum osmolality  5. AKI:  Mild.   -Fluids and recheck     Medical decision making: Patient seen at 7:53 PM on 12/20/2016.  The patient was discussed with Dr. Alvino Chapel.  What exists of the patient's chart was reviewed in depth and summarized above.  Clinical condition: stabilized with fluids.        Edwin Dada [facility] Pager [phone]

## 2016-12-20 NOTE — ED Notes (Signed)
Delay in antibiotics adminstration. Waiting to receive them from pharmacy

## 2016-12-21 DIAGNOSIS — T8130XA Disruption of wound, unspecified, initial encounter: Secondary | ICD-10-CM | POA: Insufficient documentation

## 2016-12-25 LAB — CULTURE, BLOOD (ROUTINE X 2)
CULTURE: NO GROWTH
Culture: NO GROWTH
Special Requests: ADEQUATE
Special Requests: ADEQUATE

## 2017-01-11 ENCOUNTER — Other Ambulatory Visit: Payer: Self-pay

## 2017-01-11 ENCOUNTER — Emergency Department (HOSPITAL_COMMUNITY)
Admission: EM | Admit: 2017-01-11 | Discharge: 2017-01-12 | Disposition: A | Discharge status: Home / Self Care | Payer: Medicare Other | Attending: Emergency Medicine | Admitting: Emergency Medicine

## 2017-01-11 ENCOUNTER — Encounter (HOSPITAL_COMMUNITY): Payer: Self-pay | Admitting: Emergency Medicine

## 2017-01-11 ENCOUNTER — Emergency Department (HOSPITAL_COMMUNITY): Payer: Medicare Other

## 2017-01-11 DIAGNOSIS — G8918 Other acute postprocedural pain: Secondary | ICD-10-CM | POA: Diagnosis present

## 2017-01-11 DIAGNOSIS — R0600 Dyspnea, unspecified: Secondary | ICD-10-CM | POA: Diagnosis not present

## 2017-01-11 DIAGNOSIS — Z885 Allergy status to narcotic agent status: Secondary | ICD-10-CM | POA: Insufficient documentation

## 2017-01-11 DIAGNOSIS — Z880 Allergy status to penicillin: Secondary | ICD-10-CM | POA: Insufficient documentation

## 2017-01-11 LAB — CBC WITH DIFFERENTIAL/PLATELET
Basophils Absolute: 0 10*3/uL (ref 0.0–0.1)
Basophils Relative: 1 %
EOS ABS: 0.1 10*3/uL (ref 0.0–0.7)
Eosinophils Relative: 1 %
HEMATOCRIT: 29.9 % — AB (ref 39.0–52.0)
HEMOGLOBIN: 9.5 g/dL — AB (ref 13.0–17.0)
LYMPHS ABS: 1.4 10*3/uL (ref 0.7–4.0)
Lymphocytes Relative: 24 %
MCH: 25 pg — AB (ref 26.0–34.0)
MCHC: 31.8 g/dL (ref 30.0–36.0)
MCV: 78.7 fL (ref 78.0–100.0)
MONO ABS: 0.5 10*3/uL (ref 0.1–1.0)
MONOS PCT: 8 %
NEUTROS PCT: 66 %
Neutro Abs: 3.8 10*3/uL (ref 1.7–7.7)
Platelets: 416 10*3/uL — ABNORMAL HIGH (ref 150–400)
RBC: 3.8 MIL/uL — ABNORMAL LOW (ref 4.22–5.81)
RDW: 17.5 % — AB (ref 11.5–15.5)
WBC: 5.8 10*3/uL (ref 4.0–10.5)

## 2017-01-11 LAB — BASIC METABOLIC PANEL
Anion gap: 9 (ref 5–15)
BUN: 14 mg/dL (ref 6–20)
CO2: 26 mmol/L (ref 22–32)
CREATININE: 0.91 mg/dL (ref 0.61–1.24)
Calcium: 9.3 mg/dL (ref 8.9–10.3)
Chloride: 100 mmol/L — ABNORMAL LOW (ref 101–111)
GFR calc Af Amer: >60 mL/min (ref >60)
GFR calc non Af Amer: >60 mL/min (ref >60)
Glucose, Bld: 110 mg/dL — ABNORMAL HIGH (ref 65–99)
Potassium: 3.7 mmol/L (ref 3.5–5.1)
SODIUM: 135 mmol/L (ref 135–145)

## 2017-01-11 NOTE — ED Triage Notes (Signed)
PT states he had back surgery recently to clean out infection from prior procedure and in the past 8 days pain has become very intense with trouble lying back.

## 2017-01-12 DIAGNOSIS — G8918 Other acute postprocedural pain: Secondary | ICD-10-CM | POA: Diagnosis not present

## 2017-01-12 LAB — TROPONIN I: Troponin I: <0.03 ng/mL (ref <0.03)

## 2017-01-12 MED ORDER — IOPAMIDOL (ISOVUE-370) INJECTION 76%
100.0000 mL | Freq: Once | INTRAVENOUS | Status: AC | PRN
  Administered 2017-01-12: 100 mL via INTRAVENOUS

## 2017-01-12 NOTE — Discharge Instructions (Signed)
The CT scan of your back is negative for signs of infection. Surgical hardware is intact. Your oxygen level on room air is 97 to 100%. The CT scan of the chest is negative for blood clot, pneumonia, or other acute problem. Please see Dr Janna Archondiego of additional testing. Return to the ED if any changes or problem.

## 2017-01-12 NOTE — ED Provider Notes (Signed)
[facility]-EMERGENCY DEPT Provider Note   CSN: [id number] Arrival date & time: 01/11/17  1759     History   Chief Complaint Chief Complaint  Patient presents with  . Post-op Problem    HPI Jared Beck is a 56 y.o. male.  Patient is a 56 year old male who presents to the emergency department with complaint of pain and difficulty with breathing after his operation or procedure.  The patient states that he had on back surgery earlier this month. He had to have a redo of a procedure on his back. He also had to have area of infection cleaned and irrigated. Over the past 8 days he's been having pain, and intense problem laying on his back. He also says that he has some changes with his breathing when he exerts himself even minimally. He asked to be evaluated for both of these. He has not had any fever that he is aware of. He has not had chills. There's been no drainage or unusual redness at the surgical site.      Past Medical History:  Diagnosis Date  . Anxiety   . Arthritis   . Arthritis   . Complication of anesthesia   . DDD (degenerative disc disease), lumbar   . Depression   . GERD (gastroesophageal reflux disease)    occ  . Gout   . Gout   . PONV (postoperative nausea and vomiting)    nausea    Patient Active Problem List   Diagnosis Date Noted  . 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 Complete rupture of rotator cuff 04/27/2013  . Pain in joint, shoulder region 04/27/2013  . Muscle weakness (generalized) 04/27/2013    Past Surgical History:  Procedure Laterality Date  . APPENDECTOMY  93  . BACK SURGERY    . CHOLECYSTECTOMY  10  . HERNIA REPAIR  93   umbilical, groin as child  . REVERSE SHOULDER ARTHROPLASTY Left 12/13/2014   Procedure: LEFT REVERSE TOTAL SHOULDER ARTHROPLASTY;  Surgeon: Beverely LowSteve Norris, MD;  [facility];  Service: Orthopedics;  Laterality: Left;  . rotator cuff surgery Left 14       Home Medications    Prior to Admission medications     Medication Sig Start Date End Date Taking? Authorizing Provider  allopurinol (ZYLOPRIM) 300 MG tablet Take 300 mg by mouth daily.   Yes [provider]  Cholecalciferol (VITAMIN D3) 5000 units CAPS Take 5,000 Units by mouth daily.   Yes [provider]  ciprofloxacin (CIPRO) 750 MG tablet Take 750 mg by mouth 2 (two) times daily.   Yes [provider]  cyclobenzaprine (FLEXERIL) 10 MG tablet Take 10 mg by mouth 3 (three) times daily as needed for muscle spasms.   Yes [provider]  gabapentin (NEURONTIN) 300 MG capsule Take 300 mg by mouth 3 (three) times daily.   Yes [provider]  oxyCODONE-acetaminophen (PERCOCET/ROXICET) 5-325 MG tablet Take 1 tablet by mouth 4 (four) times daily. 11/04/16  Yes [provider]  pramipexole (MIRAPEX) 0.5 MG tablet Take 0.5 mg by mouth every evening.   Yes [provider]  promethazine (PHENERGAN) 12.5 MG tablet Take 12.5 mg by mouth every 6 (six) hours as needed for nausea or vomiting.   Yes [provider]  doxycycline (VIBRAMYCIN) 100 MG capsule Take 1 capsule (100 mg total) by mouth 2 (two) times daily. Patient not taking: Reported on 12/20/2016 12/05/15   Linwood DibblesKnapp, Jon, MD    Family History Family History  Problem  Relation Age of Onset  . Cancer Mother   . Cancer Father   . Heart disease Father   . Arrhythmia Brother        Needs pacer  . Stroke Brother     Social History Social History  Substance Use Topics  . Smoking status: Never Smoker  . Smokeless tobacco: Never Used  . Alcohol use No     Allergies   Penicillins and Vicodin [hydrocodone-acetaminophen]   Review of Systems Review of Systems  Constitutional: Negative for activity change, appetite change, chills and fever.  HENT: Negative for congestion, ear discharge, ear pain, facial swelling, nosebleeds, rhinorrhea, sneezing and tinnitus.   Eyes: Negative for photophobia, pain and discharge.  Respiratory:  Positive for shortness of breath. Negative for cough, choking and wheezing.   Cardiovascular: Negative for chest pain, palpitations and leg swelling.  Gastrointestinal: Negative for abdominal pain, blood in stool, constipation, diarrhea, nausea and vomiting.  Genitourinary: Negative for difficulty urinating, dysuria, flank pain, frequency and hematuria.  Musculoskeletal: Negative for back pain, gait problem, myalgias and neck pain.  Skin: Negative for color change, rash and wound.       Surgical wound.  Neurological: Negative for dizziness, seizures, syncope, facial asymmetry, speech difficulty, weakness and numbness.  Hematological: Negative for adenopathy. Does not bruise/bleed easily.  Psychiatric/Behavioral: Negative for agitation, confusion, hallucinations, self-injury and suicidal ideas. The patient is not nervous/anxious.      Physical Exam Updated Vital Signs BP 115/60   Pulse 70   Temp 97.7 F (36.5 C) (Oral)   Resp 16   Ht 5\' 7"  (1.702 m)   Wt 73.9 kg (163 lb)   SpO2 95%   BMI 25.53 kg/m   Physical Exam  Constitutional: He is oriented to person, place, and time. He appears well-developed and well-nourished.  Non-toxic appearance.  HENT:  Head: Normocephalic.  Right Ear: Tympanic membrane and external ear normal.  Left Ear: Tympanic membrane and external ear normal.  Eyes: EOM and lids are normal. Pupils are equal, round, and reactive to light.  Neck: Normal range of motion. Neck supple. Carotid bruit is not present.  Cardiovascular: Normal rate, regular rhythm, normal heart sounds, intact distal pulses and normal pulses.   Pulmonary/Chest: Effort normal and breath sounds normal. No respiratory distress. He has no wheezes. He has no rales.  Abdominal: Soft. Bowel sounds are normal. He exhibits no distension. There is no tenderness. There is no guarding.  Musculoskeletal: Normal range of motion.  Pt has surgical wound to the thoracic spine area and traverses to the  pelvis. No red streaks noted. No drainage.  Lymphadenopathy:       Head (right side): No submandibular adenopathy present.       Head (left side): No submandibular adenopathy present.    He has no cervical adenopathy.  Neurological: He is alert and oriented to person, place, and time. He has normal strength. No cranial nerve deficit or sensory deficit.  Skin: Skin is warm and dry.  Psychiatric: He has a normal mood and affect. His speech is normal.  Nursing note and vitals reviewed.    ED Treatments / Results  Labs (all labs ordered are listed, but only abnormal results are displayed) Labs Reviewed  CBC WITH DIFFERENTIAL/PLATELET - Abnormal; Notable for the following:       Result Value   RBC 3.80 (*)    Hemoglobin 9.5 (*)    HCT 29.9 (*)    MCH 25.0 (*)    RDW 17.5 (*)  Platelets 416 (*)    All other components within normal limits  BASIC METABOLIC PANEL - Abnormal; Notable for the following:    Chloride 100 (*)    Glucose, Bld 110 (*)    All other components within normal limits  TROPONIN I    EKG  EKG Interpretation None       Radiology Ct Angio Chest Pe W And/or Wo Contrast  Result Date: 01/12/2017 CLINICAL DATA:  Hypoxia with recent back surgery in EXAM: CT ANGIOGRAPHY CHEST WITH CONTRAST TECHNIQUE: Multidetector CT imaging of the chest was performed using the standard protocol during bolus administration of intravenous contrast. Multiplanar CT image reconstructions and MIPs were obtained to evaluate the vascular anatomy. CONTRAST:  100 mL Isovue 370 intravenous COMPARISON:  CT 01/11/2017, radiograph 12/20/2016 FINDINGS: Cardiovascular: Satisfactory opacification of the pulmonary arteries to the segmental level. No evidence of pulmonary embolism. Non aneurysmal aorta. No dissection is seen. Minimal atherosclerotic calcification. Coronary artery calcification. Normal heart size. Trace pericardial effusion Mediastinum/Nodes: No enlarged mediastinal, hilar, or axillary  lymph nodes. Thyroid gland, trachea, and esophagus demonstrate no significant findings. Lungs/Pleura: Lungs are clear. No pleural effusion or pneumothorax. Upper Abdomen: No acute abnormality. Musculoskeletal: Partially visualized stabilization rods and fixating screws at T10 and T11. Cement material present within T9 and T10. Paravertebral soft tissue thickening at T9 and T10 but decreased compared to prior thoracic spine CT. Review of the MIP images confirms the above findings. IMPRESSION: 1. Negative for acute pulmonary embolus or aortic dissection 2. Clear lung fields 3. Partially visualized postsurgical changes of the lower thoracic spine. Paravertebral soft tissue density at T9 and soft tissue thickening posterior to T9 and T10 and T11 consistent with postsurgical changes. Aortic Atherosclerosis (ICD10-I70.0). Electronically Signed   By: Jasmine Pang M.D.   On: 01/12/2017 01:40   Ct Lumbar Spine Wo Contrast  Result Date: 01/11/2017 CLINICAL DATA:  Recent back surgery with pain for 8 days EXAM: CT LUMBAR SPINE WITHOUT CONTRAST TECHNIQUE: Multidetector CT imaging of the lumbar spine was performed without intravenous contrast administration. Multiplanar CT image reconstructions were also generated. COMPARISON:  12/20/2016 FINDINGS: Segmentation: 5 lumbar type vertebrae. Lumbar numbering similar to CT 12/20/2016. Alignment: Mild scoliosis as before.  Lateral alignment stable. Vertebrae: Chronic endplate changes at all levels of the lumbar spine with fusion at L1-L2. No fracture is seen. Bilateral pedicle screws and iliac screws remain in place and similar compared to the prior study. Vertebral augmentation material at T9 and T10. Paraspinal and other soft tissues: Soft tissue thickening posterior to the spine. Decreased fluid in the subcutaneous soft tissues since the prior study. Resolution of previously noted fluid and gas collection posterior to L5. Residual soft tissue thickening in the region is noted.  No new or increasing fluid or gas collection is seen. Disc levels: Laminectomy at all levels of the lumbar spine. Bony foraminal narrowing on the right at L3-L4 and on the left at L4-L5. Diffuse narrowing of the lumbar spine with endplate changes. IMPRESSION: 1. Extensive postsurgical changes of the thoracolumbar spine with posterior rod and pedicle screw fixation from T10 through S1 with bilateral iliac bone fixating screws also present. 2. Resolution of previously noted gas containing fluid collection posterior to L5. Overall decreased fluid and edema within the soft tissues posterior to the spine with mild residual thickening noted. There are no new or increasing fluid or gas collections visualized. Electronically Signed   By: Jasmine Pang M.D.   On: 01/11/2017 19:54    Procedures Procedures (including  critical care time)  Medications Ordered in ED Medications  iopamidol (ISOVUE-370) 76 % injection 100 mL (100 mLs Intravenous Contrast Given 01/12/17 0041)     Initial Impression / Assessment and Plan / ED Course  I have reviewed the triage vital signs and the nursing notes.  Pertinent labs & imaging results that were available during my care of the patient were reviewed by me and considered in my medical decision making (see chart for details).       Final Clinical Impressions(s) / ED Diagnoses MDM Pt placed on O2 at 2 liters with pulse ox of 100% Vital signs stable. Pulse ox 95%  To 100% on room air. Pt speaks in complete sentences. CBC reviewed. Hgb low at 9.5 and HCT low at 29.9. WBC wnl. CT of L spine is negative for gas or problem with surgical hardware. CT angio Chest is neg for PE or pneumonia, or acute lung issue. Pt speaking in complete sentenced without O2. Pulse ox 97 to 100%. Pt has pain medication at home. He will follow up with Dr Janna Arch or return to the ED if any changes or problem.   Final diagnoses:  None    New Prescriptions New Prescriptions   No medications  on file     Duayne Cal 01/12/17 Eula Listen, MD 01/14/17 1021

## 2018-05-08 ENCOUNTER — Emergency Department (HOSPITAL_COMMUNITY)
Admission: EM | Admit: 2018-05-08 | Discharge: 2018-05-09 | Disposition: A | Discharge status: Other Acute Inpatient Hospital | Payer: Medicare Other | Source: Home / Self Care | Attending: Emergency Medicine | Admitting: Emergency Medicine

## 2018-05-08 ENCOUNTER — Encounter (HOSPITAL_COMMUNITY): Payer: Self-pay | Admitting: *Deleted

## 2018-05-08 ENCOUNTER — Other Ambulatory Visit: Payer: Self-pay

## 2018-05-08 DIAGNOSIS — F329 Major depressive disorder, single episode, unspecified: Secondary | ICD-10-CM | POA: Insufficient documentation

## 2018-05-08 DIAGNOSIS — Z79899 Other long term (current) drug therapy: Secondary | ICD-10-CM | POA: Insufficient documentation

## 2018-05-08 DIAGNOSIS — R45851 Suicidal ideations: Secondary | ICD-10-CM

## 2018-05-08 DIAGNOSIS — F339 Major depressive disorder, recurrent, unspecified: Secondary | ICD-10-CM | POA: Diagnosis not present

## 2018-05-08 LAB — URINALYSIS, ROUTINE W REFLEX MICROSCOPIC
BILIRUBIN URINE: NEGATIVE
Glucose, UA: NEGATIVE mg/dL
Hgb urine dipstick: NEGATIVE
Ketones, ur: NEGATIVE mg/dL
Leukocytes, UA: NEGATIVE
Nitrite: NEGATIVE
PH: 5 (ref 5.0–8.0)
Protein, ur: NEGATIVE mg/dL
SPECIFIC GRAVITY, URINE: 1.021 (ref 1.005–1.030)

## 2018-05-08 LAB — COMPREHENSIVE METABOLIC PANEL
ALBUMIN: 4.6 g/dL (ref 3.5–5.0)
ALK PHOS: 59 U/L (ref 38–126)
ALT: 14 U/L (ref 0–44)
AST: 26 U/L (ref 15–41)
Anion gap: 12 (ref 5–15)
BILIRUBIN TOTAL: 2.7 mg/dL — AB (ref 0.3–1.2)
BUN: 19 mg/dL (ref 6–20)
CALCIUM: 9.4 mg/dL (ref 8.9–10.3)
CO2: 22 mmol/L (ref 22–32)
Chloride: 101 mmol/L (ref 98–111)
Creatinine, Ser: 1.14 mg/dL (ref 0.61–1.24)
GFR calc Af Amer: >60 mL/min (ref >60)
GFR calc non Af Amer: >60 mL/min (ref >60)
Glucose, Bld: 100 mg/dL — ABNORMAL HIGH (ref 70–99)
Potassium: 3.9 mmol/L (ref 3.5–5.1)
Sodium: 135 mmol/L (ref 135–145)
Total Protein: 7.6 g/dL (ref 6.5–8.1)

## 2018-05-08 LAB — CBC WITH DIFFERENTIAL/PLATELET
Abs Immature Granulocytes: 0.01 10*3/uL (ref 0.00–0.07)
Basophils Absolute: 0 10*3/uL (ref 0.0–0.1)
Basophils Relative: 1 %
EOS ABS: 0.1 10*3/uL (ref 0.0–0.5)
EOS PCT: 1 %
HCT: 40.7 % (ref 39.0–52.0)
Hemoglobin: 13.3 g/dL (ref 13.0–17.0)
Immature Granulocytes: 0 %
LYMPHS ABS: 1.3 10*3/uL (ref 0.7–4.0)
Lymphocytes Relative: 23 %
MCH: 29.1 pg (ref 26.0–34.0)
MCHC: 32.7 g/dL (ref 30.0–36.0)
MCV: 89.1 fL (ref 80.0–100.0)
MONO ABS: 0.6 10*3/uL (ref 0.1–1.0)
MONOS PCT: 11 %
Neutro Abs: 3.6 10*3/uL (ref 1.7–7.7)
Neutrophils Relative %: 64 %
Platelets: 221 10*3/uL (ref 150–400)
RBC: 4.57 MIL/uL (ref 4.22–5.81)
RDW: 12.6 % (ref 11.5–15.5)
WBC: 5.5 10*3/uL (ref 4.0–10.5)
nRBC: 0 % (ref 0.0–0.2)

## 2018-05-08 LAB — ETHANOL: Alcohol, Ethyl (B): <10 mg/dL (ref <10)

## 2018-05-08 NOTE — ED Triage Notes (Signed)
Out of percocet for 2 days

## 2018-05-08 NOTE — ED Provider Notes (Signed)
Emergency Department Provider Note   I have reviewed the triage vital signs and the nursing notes.   HISTORY  Chief Complaint V70.1   HPI Jared Beck is a 57 y.o. male with medical problems as documented below the presents the emergency department today with suicidal thoughts.  Patient states he has been really depressed for last 4 months with problems with his girlfriend and financial and social situations and had worsening worsening depression with thoughts of killing himself.  He had a history of depression and passes untreated.  He does not have a definitive plan at this point.  He is not homicidal.  No hallucinations.  No other associated symptoms. No other associated or modifying symptoms.    Past Medical History:  Diagnosis Date  . Anxiety   . Arthritis   . Arthritis   . Complication of anesthesia   . DDD (degenerative disc disease), lumbar   . Depression   . GERD (gastroesophageal reflux disease)    occ  . Gout   . Gout   . PONV (postoperative nausea and vomiting)    nausea    Patient Active Problem List   Diagnosis Date Noted  . 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 Complete rupture of rotator cuff 04/27/2013  . Pain in joint, shoulder region 04/27/2013  . Muscle weakness (generalized) 04/27/2013    Past Surgical History:  Procedure Laterality Date  . APPENDECTOMY  93  . BACK SURGERY    . CHOLECYSTECTOMY  12  . HERNIA REPAIR  89   umbilical, groin as child  . REVERSE SHOULDER ARTHROPLASTY Left 12/13/2014   Procedure: LEFT REVERSE TOTAL SHOULDER ARTHROPLASTY;  Surgeon: Beverely Low, MD;  Location: [facility] OR;  Service: Orthopedics;  Laterality: Left;  . rotator cuff surgery Left 16    Current Outpatient Rx  . Order #: [id number] Class: Historical Med  . Order #: [id number] Class: Historical Med  . Order #: [id number] Class: Historical Med  . Order #: [id number] Class: Historical Med  . Order #: [id number] Class: Historical Med  . Order #: [id number] Class:  Historical Med  . Order #: [id number] Class: Historical Med  . Order #: [id number] Class: Historical Med  . Order #: [id number] Class: Historical Med  . Order #: [id number] Class: Historical Med    Allergies Cephalexin; Penicillins; and Vicodin [hydrocodone-acetaminophen]  Family History  Problem Relation Age of Onset  . Cancer Mother   . Cancer Father   . Heart disease Father   . Arrhythmia Brother        Needs pacer  . Stroke Brother     Social History Social History   Tobacco Use  . Smoking status: Never Smoker  . Smokeless tobacco: Never Used  Substance Use Topics  . Alcohol use: No  . Drug use: No    Review of Systems  All other systems negative except as documented in the HPI. All pertinent positives and negatives as reviewed in the HPI. ____________________________________________   PHYSICAL EXAM:  VITAL SIGNS: ED Triage Vitals [05/08/18 1908]  Enc Vitals Group     BP 127/81     Pulse Rate 86     Resp 18     Temp 98.4 F (36.9 C)     Temp Source Oral     SpO2 98 %     Weight 182 lb (82.6 kg)     Height 5\' 7"  (1.702 m)    Constitutional: Alert and oriented. Well appearing and in no acute distress. Eyes: Conjunctivae are normal. PERRL. EOMI. Head: Atraumatic. Nose: No  congestion/rhinnorhea. Mouth/Throat: Mucous membranes are moist.  Oropharynx non-erythematous. Neck: No stridor.  No meningeal signs.   Cardiovascular: Normal rate, regular rhythm. Good peripheral circulation. Grossly normal heart sounds.   Respiratory: Normal respiratory effort.  No retractions. Lungs CTAB. Gastrointestinal: Soft and nontender. No distention.  Musculoskeletal: No lower extremity tenderness nor edema. No gross deformities of extremities. Neurologic:  Normal speech and language. No gross focal neurologic deficits are appreciated.  Skin:  Skin is warm, dry and intact. No rash noted.   ____________________________________________   LABS (all labs ordered are listed, but  only abnormal results are displayed)  Labs Reviewed  COMPREHENSIVE METABOLIC PANEL - Abnormal; Notable for the following components:      Result Value   Glucose, Bld 100 (*)    Total Bilirubin 2.7 (*)    All other components within normal limits  URINALYSIS, ROUTINE W REFLEX MICROSCOPIC  ETHANOL  CBC WITH DIFFERENTIAL/PLATELET  RAPID URINE DRUG SCREEN, HOSP PERFORMED   ____________________________________________  EKG   EKG Interpretation  Date/Time:    Ventricular Rate:    PR Interval:    QRS Duration:   QT Interval:    QTC Calculation:   R Axis:     Text Interpretation:         ____________________________________________  RADIOLOGY  No results found.  ____________________________________________   PROCEDURES  Procedure(s) performed:   Procedures   ____________________________________________   INITIAL IMPRESSION / ASSESSMENT AND PLAN / ED COURSE  Voluntary. Will medically clear. Would IVC if tried to leave prior to TTS consultation.   Medically cleared for TTS consultation.    Pertinent labs & imaging results that were available during my care of the patient were reviewed by me and considered in my medical decision making (see chart for details).  ____________________________________________  FINAL CLINICAL IMPRESSION(S) / ED DIAGNOSES  Final diagnoses:  None     MEDICATIONS GIVEN DURING THIS VISIT:  Medications - No data to display   NEW OUTPATIENT MEDICATIONS STARTED DURING THIS VISIT:  New Prescriptions   No medications on file    Note:  This note was prepared with assistance of Dragon voice recognition software. Occasional wrong-word or sound-a-like substitutions may have occurred due to the inherent limitations of voice recognition software.   Aurel Nguyen, Barbara Cower, MD 05/09/18 0000

## 2018-05-08 NOTE — ED Triage Notes (Signed)
States he is depressed and has nothing to live for.

## 2018-05-09 ENCOUNTER — Other Ambulatory Visit: Payer: Self-pay

## 2018-05-09 ENCOUNTER — Inpatient Hospital Stay (HOSPITAL_COMMUNITY)
Admission: AD | Admit: 2018-05-09 | Discharge: 2018-05-13 | DRG: 885 | Disposition: A | Discharge status: Home / Self Care | Payer: Medicare Other | Source: Intra-hospital | Attending: Psychiatry | Admitting: Psychiatry

## 2018-05-09 ENCOUNTER — Encounter (HOSPITAL_COMMUNITY): Payer: Self-pay | Admitting: *Deleted

## 2018-05-09 DIAGNOSIS — M109 Gout, unspecified: Secondary | ICD-10-CM | POA: Diagnosis present

## 2018-05-09 DIAGNOSIS — T8140XD Infection following a procedure, unspecified, subsequent encounter: Secondary | ICD-10-CM | POA: Diagnosis not present

## 2018-05-09 DIAGNOSIS — Z9049 Acquired absence of other specified parts of digestive tract: Secondary | ICD-10-CM

## 2018-05-09 DIAGNOSIS — G2581 Restless legs syndrome: Secondary | ICD-10-CM | POA: Diagnosis present

## 2018-05-09 DIAGNOSIS — M5136 Other intervertebral disc degeneration, lumbar region: Secondary | ICD-10-CM | POA: Diagnosis present

## 2018-05-09 DIAGNOSIS — Z881 Allergy status to other antibiotic agents status: Secondary | ICD-10-CM

## 2018-05-09 DIAGNOSIS — G4700 Insomnia, unspecified: Secondary | ICD-10-CM | POA: Diagnosis present

## 2018-05-09 DIAGNOSIS — Z630 Problems in relationship with spouse or partner: Secondary | ICD-10-CM | POA: Diagnosis not present

## 2018-05-09 DIAGNOSIS — M62838 Other muscle spasm: Secondary | ICD-10-CM | POA: Diagnosis present

## 2018-05-09 DIAGNOSIS — Z590 Homelessness: Secondary | ICD-10-CM

## 2018-05-09 DIAGNOSIS — Z96612 Presence of left artificial shoulder joint: Secondary | ICD-10-CM | POA: Diagnosis present

## 2018-05-09 DIAGNOSIS — F339 Major depressive disorder, recurrent, unspecified: Secondary | ICD-10-CM | POA: Diagnosis present

## 2018-05-09 DIAGNOSIS — G8929 Other chronic pain: Secondary | ICD-10-CM | POA: Diagnosis present

## 2018-05-09 DIAGNOSIS — Z791 Long term (current) use of non-steroidal anti-inflammatories (NSAID): Secondary | ICD-10-CM

## 2018-05-09 DIAGNOSIS — R45851 Suicidal ideations: Secondary | ICD-10-CM | POA: Diagnosis present

## 2018-05-09 DIAGNOSIS — Z823 Family history of stroke: Secondary | ICD-10-CM | POA: Diagnosis not present

## 2018-05-09 DIAGNOSIS — Z818 Family history of other mental and behavioral disorders: Secondary | ICD-10-CM | POA: Diagnosis not present

## 2018-05-09 DIAGNOSIS — Z79899 Other long term (current) drug therapy: Secondary | ICD-10-CM | POA: Diagnosis not present

## 2018-05-09 DIAGNOSIS — Z79890 Hormone replacement therapy (postmenopausal): Secondary | ICD-10-CM | POA: Diagnosis not present

## 2018-05-09 DIAGNOSIS — F332 Major depressive disorder, recurrent severe without psychotic features: Secondary | ICD-10-CM | POA: Diagnosis not present

## 2018-05-09 DIAGNOSIS — Z880 Allergy status to penicillin: Secondary | ICD-10-CM | POA: Diagnosis not present

## 2018-05-09 DIAGNOSIS — Z792 Long term (current) use of antibiotics: Secondary | ICD-10-CM

## 2018-05-09 DIAGNOSIS — Y839 Surgical procedure, unspecified as the cause of abnormal reaction of the patient, or of later complication, without mention of misadventure at the time of the procedure: Secondary | ICD-10-CM | POA: Diagnosis present

## 2018-05-09 DIAGNOSIS — Z598 Other problems related to housing and economic circumstances: Secondary | ICD-10-CM | POA: Diagnosis not present

## 2018-05-09 DIAGNOSIS — F411 Generalized anxiety disorder: Secondary | ICD-10-CM | POA: Diagnosis present

## 2018-05-09 DIAGNOSIS — Z885 Allergy status to narcotic agent status: Secondary | ICD-10-CM

## 2018-05-09 DIAGNOSIS — Z8249 Family history of ischemic heart disease and other diseases of the circulatory system: Secondary | ICD-10-CM

## 2018-05-09 DIAGNOSIS — F419 Anxiety disorder, unspecified: Secondary | ICD-10-CM | POA: Diagnosis not present

## 2018-05-09 DIAGNOSIS — Z23 Encounter for immunization: Secondary | ICD-10-CM | POA: Diagnosis present

## 2018-05-09 LAB — RAPID URINE DRUG SCREEN, HOSP PERFORMED
Amphetamines: NOT DETECTED
BARBITURATES: NOT DETECTED
Benzodiazepines: NOT DETECTED
COCAINE: NOT DETECTED
Opiates: NOT DETECTED
TETRAHYDROCANNABINOL: NOT DETECTED

## 2018-05-09 MED ORDER — GABAPENTIN 300 MG PO CAPS
300.0000 mg | ORAL_CAPSULE | Freq: Three times a day (TID) | ORAL | Status: DC
  Administered 2018-05-09 – 2018-05-13 (×13): 300 mg via ORAL
  Filled 2018-05-09 (×19): qty 1

## 2018-05-09 MED ORDER — PRAMIPEXOLE DIHYDROCHLORIDE 0.25 MG PO TABS
0.5000 mg | ORAL_TABLET | Freq: Every day | ORAL | Status: DC
  Administered 2018-05-09 – 2018-05-12 (×3): 0.5 mg via ORAL
  Filled 2018-05-09 (×6): qty 2

## 2018-05-09 MED ORDER — ALUM & MAG HYDROXIDE-SIMETH 200-200-20 MG/5ML PO SUSP: 30.0000 mL | ORAL | Status: DC | PRN

## 2018-05-09 MED ORDER — NAPROXEN 500 MG PO TABS
500.0000 mg | ORAL_TABLET | Freq: Two times a day (BID) | ORAL | Status: DC
  Administered 2018-05-09 – 2018-05-13 (×8): 500 mg via ORAL
  Filled 2018-05-09 (×12): qty 1

## 2018-05-09 MED ORDER — SULFAMETHOXAZOLE-TRIMETHOPRIM 400-80 MG PO TABS
2.0000 | ORAL_TABLET | Freq: Every day | ORAL | Status: DC
  Administered 2018-05-09 – 2018-05-13 (×5): 2 via ORAL
  Filled 2018-05-09 (×8): qty 2

## 2018-05-09 MED ORDER — CYCLOBENZAPRINE HCL 10 MG PO TABS
10.0000 mg | ORAL_TABLET | Freq: Three times a day (TID) | ORAL | Status: DC
  Administered 2018-05-09 – 2018-05-13 (×13): 10 mg via ORAL
  Filled 2018-05-09 (×19): qty 1

## 2018-05-09 MED ORDER — GABAPENTIN 600 MG PO TABS
300.0000 mg | ORAL_TABLET | Freq: Three times a day (TID) | ORAL | Status: DC
  Filled 2018-05-09 (×3): qty 0.5

## 2018-05-09 MED ORDER — FLUOXETINE HCL 10 MG PO CAPS
10.0000 mg | ORAL_CAPSULE | Freq: Every day | ORAL | Status: DC
  Administered 2018-05-09 – 2018-05-13 (×5): 10 mg via ORAL
  Filled 2018-05-09 (×8): qty 1

## 2018-05-09 MED ORDER — COLCHICINE 0.6 MG PO TABS: 0.6000 mg | ORAL_TABLET | Freq: Every day | ORAL | Status: DC | PRN

## 2018-05-09 MED ORDER — HYDROXYZINE HCL 25 MG PO TABS: 25.0000 mg | ORAL_TABLET | Freq: Three times a day (TID) | ORAL | Status: DC | PRN

## 2018-05-09 MED ORDER — TRAZODONE HCL 50 MG PO TABS: 50.0000 mg | ORAL_TABLET | Freq: Every evening | ORAL | Status: DC | PRN

## 2018-05-09 MED ORDER — VITAMIN D3 25 MCG (1000 UNIT) PO TABS
1000.0000 [IU] | ORAL_TABLET | Freq: Every day | ORAL | Status: DC
  Administered 2018-05-09 – 2018-05-13 (×5): 1000 [IU] via ORAL
  Filled 2018-05-09 (×8): qty 1

## 2018-05-09 MED ORDER — OXYCODONE-ACETAMINOPHEN 5-325 MG PO TABS: 1.0000 | ORAL_TABLET | Freq: Three times a day (TID) | ORAL | Status: DC | PRN

## 2018-05-09 MED ORDER — TAMSULOSIN HCL 0.4 MG PO CAPS
0.4000 mg | ORAL_CAPSULE | Freq: Every day | ORAL | Status: DC
  Administered 2018-05-09 – 2018-05-12 (×4): 0.4 mg via ORAL
  Filled 2018-05-09 (×7): qty 1

## 2018-05-09 MED ORDER — ALBUTEROL SULFATE HFA 108 (90 BASE) MCG/ACT IN AERS: 1.0000 | INHALATION_SPRAY | Freq: Four times a day (QID) | RESPIRATORY_TRACT | Status: DC | PRN

## 2018-05-09 MED ORDER — ALLOPURINOL 100 MG PO TABS
100.0000 mg | ORAL_TABLET | Freq: Every day | ORAL | Status: DC
Start: 2018-05-09 — End: 2018-05-13
  Administered 2018-05-09 – 2018-05-13 (×5): 100 mg via ORAL
  Filled 2018-05-09 (×7): qty 1

## 2018-05-09 MED ORDER — MAGNESIUM HYDROXIDE 400 MG/5ML PO SUSP: 30.0000 mL | Freq: Every day | ORAL | Status: DC | PRN

## 2018-05-09 NOTE — [facility] Group Notes (Signed)
LCSW Group Therapy Note 05/09/2018 12:12 PM  Type of Therapy/Topic: Group Therapy: Feelings about Diagnosis  Participation Level: Active   Description of Group:  This group will allow patients to explore their thoughts and feelings about diagnoses they have received. Patients will be guided to explore their level of understanding and acceptance of these diagnoses. Facilitator will encourage patients to process their thoughts and feelings about the reactions of others to their diagnosis and will guide patients in identifying ways to discuss their diagnosis with significant others in their lives. This group will be process-oriented, with patients participating in exploration of their own experiences, giving and receiving support, and processing challenge from other group members.  Therapeutic Goals: 1. Patient will demonstrate understanding of diagnosis as evidenced by identifying two or more symptoms of the disorder 2. Patient will be able to express two feelings regarding the diagnosis 3. Patient will demonstrate their ability to communicate their needs through discussion and/or role play  Summary of Patient Progress:  Jared Beck was engaged and participated throughout the group session. Jared Beck reports that he was sad when he learned he had a mental health diagnosis. Jared Beck reports that he has struggled with family relationships and he would like to learn how to cope with those stressors better.     Therapeutic Modalities:  Cognitive Behavioral Therapy Brief Therapy Feelings Identification    Jalyiah Shelley Catalina [place] Clinical Social Worker
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
## 2018-05-09 NOTE — H&P (Signed)
Psychiatric Admission Assessment Adult  Patient Identification: Jared Beck MRN:  [id number] Date of Evaluation:  05/09/2018 Chief Complaint:  mdd Principal Diagnosis: <principal problem not specified> Diagnosis:   Patient Active Problem List   Diagnosis Date Noted  . Major depression, recurrent ([place]) [F33.9] 05/09/2018  . 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 Pain in joint, shoulder region [M25.519] 04/27/2013  . Muscle weakness (generalized) [M62.81] 04/27/2013   History of Present Illness: Patient is seen and examined.  Patient is a 57 year old male with a past psychiatric history significant for major depression who presented to the [facility] emergency department on 05/09/2018 with suicidal ideation.  The patient stated that he had been depressed over the last 4 months.  He stated that 2 days prior to admission his girlfriend had kicked him out of their house.  She had gotten involved with drugs over the last several months and years, and he ran off her drug addict friends.  Then everyone in the home including his girlfriend and girlfriend's mother tried to getting up on him.  They kicked him out of the house.  He he is essentially homeless.  He stated he walked the streets for 2 days and then became increasingly more depressed and suicidal.  He felt as though he was worthless, and he missed being at home with his girlfriend.  He also it admitted that he was sad that they were keeping his dog.  He had one previous episode of depression in the past where he was successfully treated with therapy and fluoxetine.  He admitted to helplessness, hopelessness, worthlessness, despair and depressed mood.  He was tearful throughout the interview.  He was admitted to the hospital for evaluation and stabilization.  Associated Signs/Symptoms: Depression Symptoms:  depressed mood, anhedonia, insomnia, psychomotor  retardation, fatigue, feelings of worthlessness/guilt, difficulty concentrating, hopelessness, suicidal thoughts without plan, anxiety, loss of energy/fatigue, disturbed sleep, weight loss, (Hypo) Manic Symptoms:  Impulsivity, Anxiety Symptoms:  Excessive Worry, Psychotic Symptoms:  Denied PTSD Symptoms: Negative Total Time spent with patient: 30 minutes  Past Psychiatric History: Patient admitted to one previous episode of depression.  He was treated with fluoxetine.  He also received counseling.  This was several years ago.  Is the patient at risk to self? Yes.    Has the patient been a risk to self in the past 6 months? No.  Has the patient been a risk to self within the distant past? No.  Is the patient a risk to others? No.  Has the patient been a risk to others in the past 6 months? No.  Has the patient been a risk to others within the distant past? No.   Prior Inpatient Therapy:   Prior Outpatient Therapy:    Alcohol Screening:   Substance Abuse History in the last 12 months:  No. Consequences of Substance Abuse: Negative Previous Psychotropic Medications: Yes  Psychological Evaluations: No  Past Medical History:  Past Medical History:  Diagnosis Date  . Anxiety   . Arthritis   . Arthritis   . Complication of anesthesia   . DDD (degenerative disc disease), lumbar   . Depression   . GERD (gastroesophageal reflux disease)    occ  . Gout   . Gout   . PONV (postoperative nausea and vomiting)    nausea    Past Surgical History:  Procedure Laterality Date  . APPENDECTOMY  93  . BACK SURGERY    .  CHOLECYSTECTOMY  10  . HERNIA REPAIR  93   umbilical, groin as child  . REVERSE SHOULDER ARTHROPLASTY Left 12/13/2014   Procedure: LEFT REVERSE TOTAL SHOULDER ARTHROPLASTY;  Surgeon: Netta Cedars, MD;  Location: [place] [facility];  Service: Orthopedics;  Laterality: Left;  . rotator cuff surgery Left 16   Family History:  Family History  Problem Relation Age of Onset  .  Cancer Mother   . Cancer Father   . Heart disease Father   . Arrhythmia Brother        Needs pacer  . Stroke Brother    Family Psychiatric  History: He stated his sister had some psychiatric issues. Tobacco Screening:   Social History:  Social History   Substance and Sexual Activity  Alcohol Use No     Social History   Substance and Sexual Activity  Drug Use No    Additional Social History:                           Allergies:   Allergies  Allergen Reactions  . Cephalexin Itching and Swelling  . Penicillins Swelling    Has patient had a PCN reaction causing immediate rash, facial/tongue/throat swelling, SOB or lightheadedness with hypotension: Yes Has patient had a PCN reaction causing severe rash involving mucus membranes or skin necrosis: Yes Has patient had a PCN reaction that required hospitalization Yes Has patient had a PCN reaction occurring within the last 10 years: No If all of the above answers are "NO", then may proceed with Cephalosporin use.   Swelling of tongue  . Vicodin [Hydrocodone-Acetaminophen] Itching   Lab Results:  Results for orders placed or performed during the hospital encounter of 05/08/18 (from the past 48 hour(s))  Urinalysis, Routine w reflex microscopic     Status: None   Collection Time: 05/08/18  8:19 PM  Result Value Ref Range   Color, Urine YELLOW YELLOW   APPearance CLEAR CLEAR   Specific Gravity, Urine 1.021 1.005 - 1.030   pH 5.0 5.0 - 8.0   Glucose, UA NEGATIVE NEGATIVE mg/dL   Hgb urine dipstick NEGATIVE NEGATIVE   Bilirubin Urine NEGATIVE NEGATIVE   Ketones, ur NEGATIVE NEGATIVE mg/dL   Protein, ur NEGATIVE NEGATIVE mg/dL   Nitrite NEGATIVE NEGATIVE   Leukocytes, UA NEGATIVE NEGATIVE    Comment: Performed at [facility], [address] [place], [place] [address]  Urine rapid drug screen (hosp performed)     Status: None   Collection Time: 05/08/18 10:16 PM  Result Value Ref Range   Opiates NONE DETECTED  NONE DETECTED   Cocaine NONE DETECTED NONE DETECTED   Benzodiazepines NONE DETECTED NONE DETECTED   Amphetamines NONE DETECTED NONE DETECTED   Tetrahydrocannabinol NONE DETECTED NONE DETECTED   Barbiturates NONE DETECTED NONE DETECTED    Comment: (NOTE) DRUG SCREEN FOR MEDICAL PURPOSES ONLY.  IF CONFIRMATION IS NEEDED FOR ANY PURPOSE, NOTIFY LAB WITHIN 5 DAYS. LOWEST DETECTABLE LIMITS FOR URINE DRUG SCREEN Drug Class                     Cutoff (ng/mL) Amphetamine and metabolites    1000 Barbiturate and metabolites    200 Benzodiazepine                 426 Tricyclics and metabolites     300 Opiates and metabolites        300 Cocaine and metabolites        300  THC                            50 Performed at [facility], [address], [place], [place] [address]   Comprehensive metabolic panel     Status: Abnormal   Collection Time: 05/08/18 10:28 PM  Result Value Ref Range   Sodium 135 135 - 145 mmol/L   Potassium 3.9 3.5 - 5.1 mmol/L   Chloride 101 98 - 111 mmol/L   CO2 22 22 - 32 mmol/L   Glucose, Bld 100 (H) 70 - 99 mg/dL   BUN 19 6 - 20 mg/dL   Creatinine, Ser 1.14 0.61 - 1.24 mg/dL   Calcium 9.4 8.9 - 10.3 mg/dL   Total Protein 7.6 6.5 - 8.1 g/dL   Albumin 4.6 3.5 - 5.0 g/dL   AST 26 15 - 41 U/L   ALT 14 0 - 44 U/L   Alkaline Phosphatase 59 38 - 126 U/L   Total Bilirubin 2.7 (H) 0.3 - 1.2 mg/dL   GFR calc non Af Amer >60 >60 mL/min   GFR calc Af Amer >60 >60 mL/min    Comment: (NOTE) The eGFR has been calculated using the CKD EPI equation. This calculation has not been validated in all clinical situations. eGFR's persistently <60 mL/min signify possible Chronic Kidney Disease.    Anion gap 12 5 - 15    Comment: Performed at [facility], [address] [place], [place] [address]  Ethanol     Status: None   Collection Time: 05/08/18 10:28 PM  Result Value Ref Range   Alcohol, Ethyl (B) <10 <10 mg/dL    Comment: (NOTE) Lowest detectable limit for serum alcohol  is 10 mg/dL. For medical purposes only. Performed at [facility], [address], [place], [place] [address]   CBC with Diff     Status: None   Collection Time: 05/08/18 10:28 PM  Result Value Ref Range   WBC 5.5 4.0 - 10.5 K/uL   RBC 4.57 4.22 - 5.81 MIL/uL   Hemoglobin 13.3 13.0 - 17.0 g/dL   HCT 40.7 39.0 - 52.0 %   MCV 89.1 80.0 - 100.0 fL   MCH 29.1 26.0 - 34.0 pg   MCHC 32.7 30.0 - 36.0 g/dL   RDW 12.6 11.5 - 15.5 %   Platelets 221 150 - 400 K/uL   nRBC 0.0 0.0 - 0.2 %   Neutrophils Relative % 64 %   Neutro Abs 3.6 1.7 - 7.7 K/uL   Lymphocytes Relative 23 %   Lymphs Abs 1.3 0.7 - 4.0 K/uL   Monocytes Relative 11 %   Monocytes Absolute 0.6 0.1 - 1.0 K/uL   Eosinophils Relative 1 %   Eosinophils Absolute 0.1 0.0 - 0.5 K/uL   Basophils Relative 1 %   Basophils Absolute 0.0 0.0 - 0.1 K/uL   Immature Granulocytes 0 %   Abs Immature Granulocytes 0.01 0.00 - 0.07 K/uL    Comment: Performed at [facility], [address] [place], [place] [address]    Blood Alcohol level:  Lab Results  Component Value Date   ETH <10 02/77/4128    Metabolic Disorder Labs:  No results found for: HGBA1C, MPG No results found for: PROLACTIN No results found for: CHOL, TRIG, HDL, CHOLHDL, VLDL, LDLCALC  Current Medications: Current Facility-Administered Medications  Medication Dose Route Frequency Provider Last Rate Last Dose  . albuterol (PROVENTIL HFA;VENTOLIN HFA) 108 (90 Base) MCG/ACT inhaler  1-2 puff  1-2 puff Inhalation Q6H PRN Sharma Covert, MD      . allopurinol (ZYLOPRIM) tablet 100 mg  100 mg Oral Daily Sharma Covert, MD      . alum & mag hydroxide-simeth (MAALOX/MYLANTA) 200-200-20 MG/5ML suspension 30 mL  30 mL Oral Q4H PRN Sharma Covert, MD      . cholecalciferol (VITAMIN D) tablet 1,000 Units  1,000 Units Oral Daily Sharma Covert, MD      . colchicine tablet 0.6 mg  0.6 mg Oral Daily PRN Sharma Covert, MD      . cyclobenzaprine (FLEXERIL) tablet 10 mg   10 mg Oral TID Sharma Covert, MD      . FLUoxetine (PROZAC) capsule 10 mg  10 mg Oral Daily Sharma Covert, MD      . gabapentin (NEURONTIN) capsule 300 mg  300 mg Oral TID Cobos, Myer Peer, MD      . hydrOXYzine (ATARAX/VISTARIL) tablet 25 mg  25 mg Oral TID PRN Sharma Covert, MD      . magnesium hydroxide (MILK OF MAGNESIA) suspension 30 mL  30 mL Oral Daily PRN Sharma Covert, MD      . naproxen (NAPROSYN) tablet 500 mg  500 mg Oral BID WC Sharma Covert, MD      . oxyCODONE-acetaminophen (PERCOCET/ROXICET) 5-325 MG per tablet 1 tablet  1 tablet Oral Q8H PRN Sharma Covert, MD      . pramipexole (MIRAPEX) tablet 0.5 mg  0.5 mg Oral QHS Sharma Covert, MD      . sulfamethoxazole-trimethoprim (BACTRIM,SEPTRA) 400-80 MG per tablet 2 tablet  2 tablet Oral Daily Sharma Covert, MD      . tamsulosin [facility]) capsule 0.4 mg  0.4 mg Oral QPC supper Sharma Covert, MD      . traZODone (DESYREL) tablet 50 mg  50 mg Oral QHS PRN Sharma Covert, MD       PTA Medications: Medications Prior to Admission  Medication Sig Dispense Refill Last Dose  . albuterol (PROVENTIL HFA;VENTOLIN HFA) 108 (90 Base) MCG/ACT inhaler Inhale 1-2 puffs into the lungs every 6 (six) hours as needed for wheezing or shortness of breath.    05/06/2018 at Unknown time  . allopurinol (ZYLOPRIM) 300 MG tablet Take 300 mg by mouth daily.   05/06/2018 at Unknown time  . Cholecalciferol (VITAMIN D3) 5000 units CAPS Take 5,000 Units by mouth daily.   05/06/2018 at Unknown time  . gabapentin (NEURONTIN) 300 MG capsule Take 300 mg by mouth 3 (three) times daily.   05/06/2018 at Unknown time  . levothyroxine (SYNTHROID, LEVOTHROID) 75 MCG tablet Take 75 mcg by mouth daily.   05/06/2018 at Unknown time  . naproxen (NAPROSYN) 500 MG tablet Take 500 mg by mouth 2 (two) times daily with a meal.   05/06/2018 at Unknown time  . oxyCODONE-acetaminophen (PERCOCET/ROXICET) 5-325 MG tablet Take 1 tablet by  mouth 3 (three) times daily.    05/06/2018 at Unknown time  . pramipexole (MIRAPEX) 0.5 MG tablet Take 0.5 mg by mouth every evening.   05/06/2018 at Unknown time  . sulfamethoxazole-trimethoprim (BACTRIM DS,SEPTRA DS) 800-160 MG tablet Take 1 tablet by mouth daily.   05/06/2018 at Unknown time  . tamsulosin (FLOMAX) 0.4 MG CAPS capsule Take 0.4 mg by mouth daily.   05/07/2018 at Unknown time    Musculoskeletal: Strength & Muscle Tone: decreased Gait & Station: ataxic Patient leans: N/A  Psychiatric Specialty  Exam: Physical Exam  Nursing note and vitals reviewed. Constitutional: He is oriented to person, place, and time. He appears well-developed and well-nourished.  HENT:  Head: Normocephalic and atraumatic.  Respiratory: Effort normal.  Neurological: He is alert and oriented to person, place, and time.    ROS  There were no vitals taken for this visit.There is no height or weight on file to calculate BMI.  General Appearance: Disheveled  Eye Contact:  Fair  Speech:  Normal Rate  Volume:  Decreased  Mood:  Depressed  Affect:  Congruent  Thought Process:  Coherent and Descriptions of Associations: Intact  Orientation:  Full (Time, Place, and Person)  Thought Content:  Logical  Suicidal Thoughts:  Yes.  without intent/plan  Homicidal Thoughts:  No  Memory:  Immediate;   Fair Recent;   Fair Remote;   Fair  Judgement:  Intact  Insight:  Fair  Psychomotor Activity:  Decreased  Concentration:  Concentration: Fair and Attention Span: Fair  Recall:  [employer] of Knowledge:  Fair  Language:  Fair  Akathisia:  Negative  Handed:  Right  AIMS (if indicated):     Assets:  Desire for Improvement Financial Resources/Insurance Leisure Time Resilience Social Support  ADL's:  Intact  Cognition:  WNL  Sleep:       Treatment Plan Summary: Daily contact with patient to assess and evaluate symptoms and progress in treatment, Medication management and Plan : Patient is seen and  examined.  Patient is a 57 year old male with a past psychiatric history significant for major depression who presented to the [facility] emergency department with suicidal ideation and worsening depression.  He will be admitted to the hospital.  He will be restarted on fluoxetine 10 mg p.o. daily.  The rest of his pain medications will be continued.  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  He will be admitted to the hospital.  He will be integrated into the milieu.  He will be encouraged to attend groups.  Social work work with the patient with regard to housing situation.  Observation Level/Precautions:  15 minute checks  Laboratory:  Chemistry Profile  Psychotherapy:    Medications:    Consultations:    Discharge Concerns:    Estimated LOS:  Other:     Physician Treatment Plan for Primary Diagnosis: <principal problem not specified> Long Term Goal(s): Improvement in symptoms so as ready for discharge  Short Term Goals: Ability to identify changes in lifestyle to reduce recurrence of condition will improve, Ability to verbalize feelings will improve, Ability to disclose and discuss suicidal ideas, Ability to demonstrate self-control will improve, Ability to identify and develop effective coping behaviors will improve and Ability to maintain clinical measurements within normal limits will improve  Physician Treatment Plan for Secondary Diagnosis: Active Problems:   Major depression, recurrent ([place])  Long Term Goal(s): Improvement in symptoms so as ready for discharge  Short Term Goals: Ability to identify changes in lifestyle to reduce recurrence of condition will improve, Ability to verbalize feelings will improve, Ability to disclose and discuss suicidal ideas, Ability to demonstrate self-control will improve, Ability to identify and develop effective coping behaviors will improve and Ability to maintain clinical measurements  within normal limits will improve  I certify that inpatient services furnished can reasonably be expected to improve the patient's condition.    Sharma Covert, MD 10/22/201912:30 PM
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
## 2018-05-09 NOTE — Plan of Care (Signed)
Nurse discussed anxiety, depression, coping skills with patient. 

## 2018-05-09 NOTE — Tx Team (Signed)
Initial Treatment Plan 05/09/2018 2:10 PM Phil Michels Kiester ZOX:[id number]    PATIENT STRESSORS: Financial difficulties Health problems Marital or family conflict Occupational concerns   PATIENT STRENGTHS: Ability for insight Average or above average intelligence Capable of independent living Wellsite geologist fund of knowledge Motivation for treatment/growth   PATIENT IDENTIFIED PROBLEMS: "anxiey"  "depression"  "panic"  "suicidal thoughts"               DISCHARGE CRITERIA:  Ability to meet basic life and health needs Adequate post-discharge living arrangements Improved stabilization in mood, thinking, and/or behavior Medical problems require only outpatient monitoring Motivation to continue treatment in a less acute level of care Need for constant or close observation no longer present Reduction of life-threatening or endangering symptoms to within safe limits Safe-care adequate arrangements made Verbal commitment to aftercare and medication compliance  PRELIMINARY DISCHARGE PLAN: Attend aftercare/continuing care group Attend PHP/IOP Outpatient therapy Placement in alternative living arrangements  PATIENT/FAMILY INVOLVEMENT: This treatment plan has been presented to and reviewed with the patient, EUGEN JEANSONNE..  The patient and family have been given the opportunity to ask questions and make suggestions.  Quintella Reichert [place], RN 05/09/2018, 2:10 PM

## 2018-05-09 NOTE — ED Notes (Addendum)
Disposition: Jared Sievert, PA, patient meets inpatient criteria. [name], patient accepted [name] Adult Unit. Patient accepted to Room 405-1. [name], RN, notified of [facility] Adult Unit acceptance. [facility] Unit Arrival time: after 8:30am Accepting: Jared Beck Attending: Dr. Jama Flavors Room: [phone]
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
## 2018-05-09 NOTE — Progress Notes (Signed)
EKG completed per MD order.  

## 2018-05-10 DIAGNOSIS — F419 Anxiety disorder, unspecified: Secondary | ICD-10-CM

## 2018-05-10 DIAGNOSIS — G4700 Insomnia, unspecified: Secondary | ICD-10-CM

## 2018-05-10 DIAGNOSIS — F339 Major depressive disorder, recurrent, unspecified: Principal | ICD-10-CM

## 2018-05-10 NOTE — Progress Notes (Addendum)
[facility] MD Progress Note  05/10/2018 1:11 PM Jared Beck  MRN:  [id number]   Subjective: Patient reports today that he feels that he is doing better here at the hospital.  He denies any suicidal or homicidal ideations and denies any hallucinations.  He reports that normally he can sleep very well and he slept very well last night.  He states only reason he has any issues with sleep at home is because his girlfriend only has 1 leg and she is constantly asked him to get up and get stuff for her throughout the night at home.  He denies any medication side effects.  He reports still feeling anxious and depressed.   Objective: Patient's chart and findings reviewed and discussed with treatment team.  Patient presents in his room and is pleasant, calm, and cooperative.  Patient has been seen in the day room but is not been interacting very much.  He has a congruent affect and is somewhat talkative but not excessive.    Principal Problem: Major depression, recurrent ([place]) Diagnosis:   Patient Active Problem List   Diagnosis Date Noted  . Major depression, recurrent ([place]) [F33.9] 05/09/2018  . 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 Pain in joint, shoulder region [M25.519] 04/27/2013  . Muscle weakness (generalized) [M62.81] 04/27/2013   Total Time spent with patient: 20 minutes  Past Psychiatric History: See H&P  Past Medical History:  Past Medical History:  Diagnosis Date  . Anxiety   . Arthritis   . Arthritis   . Complication of anesthesia   . DDD (degenerative disc disease), lumbar   . Depression   . GERD (gastroesophageal reflux disease)    occ  . Gout   . Gout   . PONV (postoperative nausea and vomiting)    nausea    Past Surgical History:  Procedure Laterality Date  . APPENDECTOMY  93  . BACK SURGERY    . CHOLECYSTECTOMY  10  . HERNIA REPAIR  93   umbilical, groin as child  . REVERSE SHOULDER ARTHROPLASTY  Left 12/13/2014   Procedure: LEFT REVERSE TOTAL SHOULDER ARTHROPLASTY;  Surgeon: Netta Cedars, MD;  Location: [facility];  Service: Orthopedics;  Laterality: Left;  . rotator cuff surgery Left 14   Family History:  Family History  Problem Relation Age of Onset  . Cancer Mother   . Cancer Father   . Heart disease Father   . Arrhythmia Brother        Needs pacer  . Stroke Brother    Family Psychiatric  History: See H&P Social History:  Social History   Substance and Sexual Activity  Alcohol Use Never  . Frequency: Never     Social History   Substance and Sexual Activity  Drug Use Never    Social History   Socioeconomic History  . Marital status: Single    Spouse name: Not on file  . Number of children: Not on file  . Years of education: 8th grade  . Highest education level: 8th grade  Occupational History  . Occupation: farmer  Social Needs  . Financial resource strain: Not on file  . Food insecurity:    Worry: Sometimes true    Inability: Sometimes true  . Transportation needs:    Medical: No    Non-medical: No  Tobacco Use  . Smoking status: Never Smoker  . Smokeless tobacco: Never Used  Substance and Sexual Activity  . Alcohol use: Never  Frequency: Never  . Drug use: Never  . Sexual activity: Not Currently  Lifestyle  . Physical activity:    Days per week: 0 days    Minutes per session: Not on file  . Stress: Very much  Relationships  . Social connections:    Talks on phone: Once a week    Gets together: Once a week    Attends religious service: Never    Active member of club or organization: No    Attends meetings of clubs or organizations: Never    Relationship status: Never married  Other Topics Concern  . Not on file  Social History Narrative   Girlfriend of 10 yrs made him leave the home.   Additional Social History:    Pain Medications: see MAR Prescriptions: see MAR Over the Counter: see MAR History of alcohol / drug use?: No history of  alcohol / drug abuse Longest period of sobriety (when/how long): never used drugs/alcohol/tobacco Negative Consequences of Use: Personal relationships, Financial Withdrawal Symptoms: Other (Comment)(no withdrawals)                    Sleep: Good  Appetite:  Good  Current Medications: Current Facility-Administered Medications  Medication Dose Route Frequency Provider Last Rate Last Dose  . albuterol (PROVENTIL HFA;VENTOLIN HFA) 108 (90 Base) MCG/ACT inhaler 1-2 puff  1-2 puff Inhalation Q6H PRN Sharma Covert, MD      . allopurinol (ZYLOPRIM) tablet 100 mg  100 mg Oral Daily Sharma Covert, MD   100 mg at 05/10/18 0815  . alum & mag hydroxide-simeth (MAALOX/MYLANTA) 200-200-20 MG/5ML suspension 30 mL  30 mL Oral Q4H PRN Sharma Covert, MD      . cholecalciferol (VITAMIN D) tablet 1,000 Units  1,000 Units Oral Daily Sharma Covert, MD   1,000 Units at 05/10/18 0815  . colchicine tablet 0.6 mg  0.6 mg Oral Daily PRN Sharma Covert, MD      . cyclobenzaprine (FLEXERIL) tablet 10 mg  10 mg Oral TID Sharma Covert, MD   10 mg at 05/10/18 1156  . FLUoxetine (PROZAC) capsule 10 mg  10 mg Oral Daily Sharma Covert, MD   10 mg at 05/10/18 0815  . gabapentin (NEURONTIN) capsule 300 mg  300 mg Oral TID Cobos, Myer Peer, MD   300 mg at 05/10/18 1157  . hydrOXYzine (ATARAX/VISTARIL) tablet 25 mg  25 mg Oral TID PRN Sharma Covert, MD      . magnesium hydroxide (MILK OF MAGNESIA) suspension 30 mL  30 mL Oral Daily PRN Sharma Covert, MD      . naproxen (NAPROSYN) tablet 500 mg  500 mg Oral BID WC Sharma Covert, MD   500 mg at 05/10/18 0815  . oxyCODONE-acetaminophen (PERCOCET/ROXICET) 5-325 MG per tablet 1 tablet  1 tablet Oral Q8H PRN Sharma Covert, MD      . pramipexole (MIRAPEX) tablet 0.5 mg  0.5 mg Oral QHS Sharma Covert, MD   0.5 mg at 05/09/18 2136  . sulfamethoxazole-trimethoprim (BACTRIM,SEPTRA) 400-80 MG per tablet 2 tablet  2 tablet  Oral Daily Sharma Covert, MD   2 tablet at 05/10/18 0815  . tamsulosin (FLOMAX) capsule 0.4 mg  0.4 mg Oral QPC supper Sharma Covert, MD   0.4 mg at 05/09/18 1716  . traZODone (DESYREL) tablet 50 mg  50 mg Oral QHS PRN Sharma Covert, MD        Lab Results:  Results for orders placed or performed during the hospital encounter of 05/08/18 (from the past 48 hour(s))  Urinalysis, Routine w reflex microscopic     Status: None   Collection Time: 05/08/18  8:19 PM  Result Value Ref Range   Color, Urine YELLOW YELLOW   APPearance CLEAR CLEAR   Specific Gravity, Urine 1.021 1.005 - 1.030   pH 5.0 5.0 - 8.0   Glucose, UA NEGATIVE NEGATIVE mg/dL   Hgb urine dipstick NEGATIVE NEGATIVE   Bilirubin Urine NEGATIVE NEGATIVE   Ketones, ur NEGATIVE NEGATIVE mg/dL   Protein, ur NEGATIVE NEGATIVE mg/dL   Nitrite NEGATIVE NEGATIVE   Leukocytes, UA NEGATIVE NEGATIVE    Comment: Performed at [facility], [address], [place], [place] [address]  Urine rapid drug screen (hosp performed)     Status: None   Collection Time: 05/08/18 10:16 PM  Result Value Ref Range   Opiates NONE DETECTED NONE DETECTED   Cocaine NONE DETECTED NONE DETECTED   Benzodiazepines NONE DETECTED NONE DETECTED   Amphetamines NONE DETECTED NONE DETECTED   Tetrahydrocannabinol NONE DETECTED NONE DETECTED   Barbiturates NONE DETECTED NONE DETECTED    Comment: (NOTE) DRUG SCREEN FOR MEDICAL PURPOSES ONLY.  IF CONFIRMATION IS NEEDED FOR ANY PURPOSE, NOTIFY LAB WITHIN 5 DAYS. LOWEST DETECTABLE LIMITS FOR URINE DRUG SCREEN Drug Class                     Cutoff (ng/mL) Amphetamine and metabolites    1000 Barbiturate and metabolites    200 Benzodiazepine                 017 Tricyclics and metabolites     300 Opiates and metabolites        300 Cocaine and metabolites        300 THC                            50 Performed at [facility], [address] [place], [place] [address]   Comprehensive metabolic panel      Status: Abnormal   Collection Time: 05/08/18 10:28 PM  Result Value Ref Range   Sodium 135 135 - 145 mmol/L   Potassium 3.9 3.5 - 5.1 mmol/L   Chloride 101 98 - 111 mmol/L   CO2 22 22 - 32 mmol/L   Glucose, Bld 100 (H) 70 - 99 mg/dL   BUN 19 6 - 20 mg/dL   Creatinine, Ser 1.14 0.61 - 1.24 mg/dL   Calcium 9.4 8.9 - 10.3 mg/dL   Total Protein 7.6 6.5 - 8.1 g/dL   Albumin 4.6 3.5 - 5.0 g/dL   AST 26 15 - 41 U/L   ALT 14 0 - 44 U/L   Alkaline Phosphatase 59 38 - 126 U/L   Total Bilirubin 2.7 (H) 0.3 - 1.2 mg/dL   GFR calc non Af Amer >60 >60 mL/min   GFR calc Af Amer >60 >60 mL/min    Comment: (NOTE) The eGFR has been calculated using the CKD EPI equation. This calculation has not been validated in all clinical situations. eGFR's persistently <60 mL/min signify possible Chronic Kidney Disease.    Anion gap 12 5 - 15    Comment: Performed at [facility], [address], [place], [place] [address]  Ethanol     Status: None   Collection Time: 05/08/18 10:28 PM  Result Value Ref Range   Alcohol, Ethyl (B) <10 <10  mg/dL    Comment: (NOTE) Lowest detectable limit for serum alcohol is 10 mg/dL. For medical purposes only. Performed at [facility], [address], [place], [place] [address]   CBC with Diff     Status: None   Collection Time: 05/08/18 10:28 PM  Result Value Ref Range   WBC 5.5 4.0 - 10.5 K/uL   RBC 4.57 4.22 - 5.81 MIL/uL   Hemoglobin 13.3 13.0 - 17.0 g/dL   HCT 40.7 39.0 - 52.0 %   MCV 89.1 80.0 - 100.0 fL   MCH 29.1 26.0 - 34.0 pg   MCHC 32.7 30.0 - 36.0 g/dL   RDW 12.6 11.5 - 15.5 %   Platelets 221 150 - 400 K/uL   nRBC 0.0 0.0 - 0.2 %   Neutrophils Relative % 64 %   Neutro Abs 3.6 1.7 - 7.7 K/uL   Lymphocytes Relative 23 %   Lymphs Abs 1.3 0.7 - 4.0 K/uL   Monocytes Relative 11 %   Monocytes Absolute 0.6 0.1 - 1.0 K/uL   Eosinophils Relative 1 %   Eosinophils Absolute 0.1 0.0 - 0.5 K/uL   Basophils Relative 1 %   Basophils Absolute 0.0 0.0 - 0.1 K/uL    Immature Granulocytes 0 %   Abs Immature Granulocytes 0.01 0.00 - 0.07 K/uL    Comment: Performed at [facility], [address], [place], [place] [address]    Blood Alcohol level:  Lab Results  Component Value Date   ETH <10 41/66/0630    Metabolic Disorder Labs: No results found for: HGBA1C, MPG No results found for: PROLACTIN No results found for: CHOL, TRIG, HDL, CHOLHDL, VLDL, LDLCALC  Physical Findings: AIMS: Facial and Oral Movements Muscles of Facial Expression: None, normal Lips and Perioral Area: None, normal Jaw: None, normal Tongue: None, normal,Extremity Movements Upper (arms, wrists, hands, fingers): None, normal Lower (legs, knees, ankles, toes): None, normal, Trunk Movements Neck, shoulders, hips: None, normal, Overall Severity Severity of abnormal movements (highest score from questions above): None, normal Incapacitation due to abnormal movements: None, normal Patient's awareness of abnormal movements (rate only patient's report): No Awareness, Dental Status Current problems with teeth and/or dentures?: No Does patient usually wear dentures?: No  CIWA:  CIWA-Ar Total: 3 COWS:  COWS Total Score: 3  Musculoskeletal: Strength & Muscle Tone: within normal limits Gait & Station: normal Patient leans: N/A  Psychiatric Specialty Exam: Physical Exam  Nursing note and vitals reviewed. Constitutional: He is oriented to person, place, and time. He appears well-developed and well-nourished.  Cardiovascular: Normal rate.  Respiratory: Effort normal.  Musculoskeletal: Normal range of motion.  Neurological: He is alert and oriented to person, place, and time.  Skin: Skin is warm.    Review of Systems  Constitutional: Negative.   HENT: Negative.   Eyes: Negative.   Respiratory: Negative.   Cardiovascular: Negative.   Gastrointestinal: Negative.   Genitourinary: Negative.   Musculoskeletal: Negative.   Skin: Negative.   Neurological: Negative.    Endo/Heme/Allergies: Negative.   Psychiatric/Behavioral: Positive for depression. Negative for suicidal ideas. The patient is nervous/anxious.     Blood pressure 103/73, pulse 80, temperature 97.8 F (36.6 C), temperature source Oral, resp. rate 20, height _0  (1.702 m), weight 77.6 kg, SpO2 100 %.Body mass index is 26.78 kg/m.  General Appearance: Casual  Eye Contact:  Good  Speech:  Clear and Coherent and Normal Rate  Volume:  Normal  Mood:  Anxious and Depressed  Affect:  Congruent  Thought Process:  Goal Directed and Descriptions of Associations: Intact  Orientation:  Full (Time, Place, and Person)  Thought Content:  WDL  Suicidal Thoughts:  No  Homicidal Thoughts:  No  Memory:  Immediate;   Good Recent;   Good Remote;   Good  Judgement:  Fair  Insight:  Good  Psychomotor Activity:  Normal  Concentration:  Concentration: Good and Attention Span: Good  Recall:  Good  Fund of Knowledge:  Good  Language:  Good  Akathisia:  No  Handed:  Right  AIMS (if indicated):     Assets:  Communication Skills Desire for Improvement Financial Resources/Insurance Housing Resilience Social Support Transportation  ADL's:  Intact  Cognition:  WNL  Sleep:  Number of Hours: 6.75   Problems addressed MDD recurrent GAD  Treatment Plan Summary: Daily contact with patient to assess and evaluate symptoms and progress in treatment, Medication management and Plan is to: Continue Prozac 10 mg p.o. daily for mood stability Continue Neurontin 300 mg p.o. 3 times daily for mood stability and pain Continue Vistaril 25 mg p.o. 3 times daily as needed for anxiety Continue trazodone 50 mg p.o. nightly as needed for insomnia Encourage group therapy participation   Lewis Shock, FNP 05/10/2018, 1:11 PM

## 2018-05-10 NOTE — Progress Notes (Signed)
D    Pt reports feeling depressed and sad    He interacts minimally with others but his behavior is appropriate  A   Verbal support given   Offered medications  Q 15 min checks  R   Pt remains safe at this time

## 2018-05-10 NOTE — Therapy (Signed)
Occupational Therapy Group Note  Date:  05/10/2018 Time:  2:27 PM  Group Topic/Focus:  Yoga/Relaxation  Participation Level:  Active  Participation Quality:  Appropriate  Affect:  Appropriate  Cognitive:  Appropriate  Insight: Improving  Engagement in Group:  Engaged  Modes of Intervention:  Activity, Discussion, Education and Socialization  Additional Comments:    S: "I have always wanted to know what yoga was like"  O: Education given on stress management and relaxation to develop coping skills when reintegrating into community. Healthy stress management strategies brainstormed within group, pt encouraged to contribute responses. Pt guided through relaxation chair yoga. Pt asked if pain a factor in mobility, adapted exercises to mett mobility needs and safety. PMR delivered at end of session.   A: Pt presents to group,with appropriate affect, engaged and participatory throughout session. Pt shares that petting his animals and listening to music helps relieve stress this date. Pt engaged in yoga session, noted pain in shoulder and back and appropriate accommodations made for safety.   P: OT will continue to follow up for implementation of coping skills while pt acute.   Dalphine Handing, MSOT, OTR/L Behavioral Health OT/ Acute Relief OT  Dalphine Handing 05/10/2018, 2:27 PM

## 2018-05-10 NOTE — Progress Notes (Signed)
Nutrition Brief Note  Patient identified on the Malnutrition Screening Tool (MST) Report  No recent weight loss noted.   Wt Readings from Last 15 Encounters:  05/09/18 77.6 kg  05/08/18 82.6 kg  01/11/17 73.9 kg  12/20/16 73.9 kg  07/08/16 73.5 kg  12/05/15 75.8 kg  10/31/15 75.8 kg  05/12/15 80.7 kg  02/26/15 83.5 kg  12/13/14 88.5 kg  12/03/14 88.5 kg  08/06/14 76.7 kg  04/09/14 81.2 kg  03/20/14 78.5 kg  02/13/14 78.9 kg    Body mass index is 26.78 kg/m. Patient meets criteria for overweight based on current BMI. Labs and medications reviewed.   No nutrition interventions warranted at this time. If nutrition issues arise, please consult RD.   Tilda Franco, MS, RD, LDN [facility] Inpatient Clinical Dietitian Pager: [phone] After Hours Pager: [phone]

## 2018-05-10 NOTE — Progress Notes (Signed)
Adult Psychoeducational Group Note  Date:  05/10/2018 Time:  4:36 AM  Group Topic/Focus:  Wrap-Up Group:   The focus of this group is to help patients review their daily goal of treatment and discuss progress on daily workbooks.  Participation Level:  Active  Participation Quality:  Appropriate  Affect:  Appropriate  Cognitive:  Appropriate  Insight: Appropriate  Engagement in Group:  Engaged  Modes of Intervention:  Discussion  Additional Comments:  Pt attend wrap up group. His day was a 10. Pt said he feels safe today.  Charna Busman Long 05/10/2018, 4:36 AM

## 2018-05-10 NOTE — Progress Notes (Signed)
Pt is new to the unit late this afternoon.  He was initially observed sitting in the dayroom watching TV. Writer walked with him to his room to talk about the circumstances that brought him to the hospital.  He says that his GF of about 10 yrs kicked him out because he turned in her drug suppliers.  He reports he has never been a substance user, and she was sober for a while, but then relapsed.  He states he has a lot of health issues, and not having anywhere to go has been rough.  He says after about four days, he went to the hospital because he was having thoughts to hurt himself.  Pt contracts for Actor.  He denies HI/AVH at this time.  He was encouraged to make his needs known to staff.  Writer reviewed available meds with pt.  Pt voiced understanding.  Pt has been pleasant and cooperative with staff.  Support and encouragement offered.  Discharge plans are in process.  Safety maintained with q15 minute checks.

## 2018-05-10 NOTE — Tx Team (Signed)
Interdisciplinary Treatment and Diagnostic Plan Update  05/10/2018 Time of Session: 10:00am Jared Beck MRN: [id number]  Principal Diagnosis: <principal problem not specified>  Secondary Diagnoses: Active Problems:   Major depression, recurrent (HCC)   Current Medications:  Current Facility-Administered Medications  Medication Dose Route Frequency Provider Last Rate Last Dose  . albuterol (PROVENTIL HFA;VENTOLIN HFA) 108 (90 Base) MCG/ACT inhaler 1-2 puff  1-2 puff Inhalation Q6H PRN Sharma Covert, MD      . allopurinol (ZYLOPRIM) tablet 100 mg  100 mg Oral Daily Sharma Covert, MD   100 mg at 05/10/18 0815  . alum & mag hydroxide-simeth (MAALOX/MYLANTA) 200-200-20 MG/5ML suspension 30 mL  30 mL Oral Q4H PRN Sharma Covert, MD      . cholecalciferol (VITAMIN D) tablet 1,000 Units  1,000 Units Oral Daily Sharma Covert, MD   1,000 Units at 05/10/18 0815  . colchicine tablet 0.6 mg  0.6 mg Oral Daily PRN Sharma Covert, MD      . cyclobenzaprine (FLEXERIL) tablet 10 mg  10 mg Oral TID Sharma Covert, MD   10 mg at 05/10/18 0815  . FLUoxetine (PROZAC) capsule 10 mg  10 mg Oral Daily Sharma Covert, MD   10 mg at 05/10/18 0815  . gabapentin (NEURONTIN) capsule 300 mg  300 mg Oral TID Cobos, Myer Peer, MD   300 mg at 05/10/18 0815  . hydrOXYzine (ATARAX/VISTARIL) tablet 25 mg  25 mg Oral TID PRN Sharma Covert, MD      . magnesium hydroxide (MILK OF MAGNESIA) suspension 30 mL  30 mL Oral Daily PRN Sharma Covert, MD      . naproxen (NAPROSYN) tablet 500 mg  500 mg Oral BID WC Sharma Covert, MD   500 mg at 05/10/18 0815  . oxyCODONE-acetaminophen (PERCOCET/ROXICET) 5-325 MG per tablet 1 tablet  1 tablet Oral Q8H PRN Sharma Covert, MD      . pramipexole (MIRAPEX) tablet 0.5 mg  0.5 mg Oral QHS Sharma Covert, MD   0.5 mg at 05/09/18 2136  . sulfamethoxazole-trimethoprim (BACTRIM,SEPTRA) 400-80 MG per tablet 2 tablet  2 tablet Oral Daily  Sharma Covert, MD   2 tablet at 05/10/18 0815  . tamsulosin (FLOMAX) capsule 0.4 mg  0.4 mg Oral QPC supper Sharma Covert, MD   0.4 mg at 05/09/18 1716  . traZODone (DESYREL) tablet 50 mg  50 mg Oral QHS PRN Sharma Covert, MD       PTA Medications: Medications Prior to Admission  Medication Sig Dispense Refill Last Dose  . albuterol (PROVENTIL HFA;VENTOLIN HFA) 108 (90 Base) MCG/ACT inhaler Inhale 1-2 puffs into the lungs every 6 (six) hours as needed for wheezing or shortness of breath.    05/06/2018 at Unknown time  . allopurinol (ZYLOPRIM) 300 MG tablet Take 300 mg by mouth daily.   05/06/2018 at Unknown time  . Cholecalciferol (VITAMIN D3) 5000 units CAPS Take 5,000 Units by mouth daily.   05/06/2018 at Unknown time  . gabapentin (NEURONTIN) 300 MG capsule Take 300 mg by mouth 3 (three) times daily.   05/06/2018 at Unknown time  . levothyroxine (SYNTHROID, LEVOTHROID) 75 MCG tablet Take 75 mcg by mouth daily.   05/06/2018 at Unknown time  . naproxen (NAPROSYN) 500 MG tablet Take 500 mg by mouth 2 (two) times daily with a meal.   05/06/2018 at Unknown time  . oxyCODONE-acetaminophen (PERCOCET/ROXICET) 5-325 MG tablet Take 1 tablet by mouth 3 (  three) times daily.    05/06/2018 at Unknown time  . pramipexole (MIRAPEX) 0.5 MG tablet Take 0.5 mg by mouth every evening.   05/06/2018 at Unknown time  . sulfamethoxazole-trimethoprim (BACTRIM DS,SEPTRA DS) 800-160 MG tablet Take 1 tablet by mouth daily.   05/06/2018 at Unknown time  . tamsulosin (FLOMAX) 0.4 MG CAPS capsule Take 0.4 mg by mouth daily.   05/07/2018 at Unknown time    Patient Stressors: Financial difficulties Health problems Marital or family conflict Occupational concerns  Patient Strengths: Ability for insight Average or above average intelligence Capable of independent living Curator fund of knowledge Motivation for treatment/growth  Treatment Modalities: Medication Management, Group  therapy, Case management,  1 to 1 session with clinician, Psychoeducation, Recreational therapy.   Physician Treatment Plan for Primary Diagnosis: <principal problem not specified> Long Term Goal(s): Improvement in symptoms so as ready for discharge Improvement in symptoms so as ready for discharge   Short Term Goals: Ability to identify changes in lifestyle to reduce recurrence of condition will improve Ability to verbalize feelings will improve Ability to disclose and discuss suicidal ideas Ability to demonstrate self-control will improve Ability to identify and develop effective coping behaviors will improve Ability to maintain clinical measurements within normal limits will improve Ability to identify changes in lifestyle to reduce recurrence of condition will improve Ability to verbalize feelings will improve Ability to disclose and discuss suicidal ideas Ability to demonstrate self-control will improve Ability to identify and develop effective coping behaviors will improve Ability to maintain clinical measurements within normal limits will improve  Medication Management: Evaluate patient's response, side effects, and tolerance of medication regimen.  Therapeutic Interventions: 1 to 1 sessions, Unit Group sessions and Medication administration.  Evaluation of Outcomes: Not Met  Physician Treatment Plan for Secondary Diagnosis: Active Problems:   Major depression, recurrent ([place])  Long Term Goal(s): Improvement in symptoms so as ready for discharge Improvement in symptoms so as ready for discharge   Short Term Goals: Ability to identify changes in lifestyle to reduce recurrence of condition will improve Ability to verbalize feelings will improve Ability to disclose and discuss suicidal ideas Ability to demonstrate self-control will improve Ability to identify and develop effective coping behaviors will improve Ability to maintain clinical measurements within normal limits  will improve Ability to identify changes in lifestyle to reduce recurrence of condition will improve Ability to verbalize feelings will improve Ability to disclose and discuss suicidal ideas Ability to demonstrate self-control will improve Ability to identify and develop effective coping behaviors will improve Ability to maintain clinical measurements within normal limits will improve     Medication Management: Evaluate patient's response, side effects, and tolerance of medication regimen.  Therapeutic Interventions: 1 to 1 sessions, Unit Group sessions and Medication administration.  Evaluation of Outcomes: Not Met   RN Treatment Plan for Primary Diagnosis: <principal problem not specified> Long Term Goal(s): Knowledge of disease and therapeutic regimen to maintain health will improve  Short Term Goals: Ability to verbalize feelings will improve, Ability to disclose and discuss suicidal ideas and Ability to identify and develop effective coping behaviors will improve  Medication Management: RN will administer medications as ordered by provider, will assess and evaluate patient's response and provide education to patient for prescribed medication. RN will report any adverse and/or side effects to prescribing provider.  Therapeutic Interventions: 1 on 1 counseling sessions, Psychoeducation, Medication administration, Evaluate responses to treatment, Monitor vital signs and CBGs as ordered, Perform/monitor CIWA, COWS, AIMS and Fall  Risk screenings as ordered, Perform wound care treatments as ordered.  Evaluation of Outcomes: Not Met   LCSW Treatment Plan for Primary Diagnosis: <principal problem not specified> Long Term Goal(s): Safe transition to appropriate next level of care at discharge, Engage patient in therapeutic group addressing interpersonal concerns.  Short Term Goals: Engage patient in aftercare planning with referrals and resources  Therapeutic Interventions: Assess for all  discharge needs, 1 to 1 time with Social worker, Explore available resources and support systems, Assess for adequacy in community support network, Educate family and significant other(s) on suicide prevention, Complete Psychosocial Assessment, Interpersonal group therapy.  Evaluation of Outcomes: Not Met   Progress in Treatment: Attending groups: Yes. Participating in groups: Yes. Taking medication as prescribed: Yes. Toleration medication: Yes. Family/Significant other contact made: No, will contact:  patient declines, reports he does not have anyone to contact Patient understands diagnosis: Yes. Discussing patient identified problems/goals with staff: Yes. Medical problems stabilized or resolved: Yes. Denies suicidal/homicidal ideation: Yes. Issues/concerns per patient self-inventory: No. Other:   New problem(s) identified:   New Short Term/Long Term Goal(s):medication stabilization, elimination of SI thoughts, development of comprehensive mental wellness plan.    Patient Goals: "I want to be happy and be able to do more for myself. I am interested in therapy services"  Discharge Plan or Barriers: CSW will assess for appropriate referrals and discharge planning.   Reason for Continuation of Hospitalization: Depression Medication stabilization Suicidal ideation  Estimated Length of Stay: 3-5 days  Attendees: Patient: Jared Beck  05/10/2018 11:33 AM  Physician: Dr. Myles Lipps, MD 05/10/2018 11:33 AM  Nursing: Danae Chen.Loletha Grayer, RN 05/10/2018 11:33 AM  RN Care Manager:X 05/10/2018 11:33 AM  Social Worker: Radonna Ricker, [place] 05/10/2018 11:33 AM  Recreational Therapist: Rhunette Croft 05/10/2018 11:33 AM  Other: Marvia Pickles, NP 05/10/2018 11:33 AM  Other:  05/10/2018 11:33 AM  Other: 05/10/2018 11:33 AM    Scribe for Treatment Team: Marylee Floras, [place] 05/10/2018 11:33 AM

## 2018-05-10 NOTE — Progress Notes (Signed)
D: Patient endorses passive SI but denies HI or AVH and verbally contracts for safety on the unit. Patient presents as depressed, anxious and apprehensive but pleasant and cooperative.  Pt. States that he slept well last night and has a good appetite. Pt. Reports his depression 7/10 and anxiety 5/10.  Pt. States he is working on getting better so he can feel safe.  Pt. Is visualized on the unit interacting with staff and his peers.  Pt. Is attending and participating in groups.  A: Patient given emotional support from RN. Patient encouraged to come to staff with concerns and/or questions. Patient's medication routine continued. Patient's orders and plan of care reviewed.   R: Patient remains appropriate and cooperative. Will continue to monitor patient q15 minutes for safety.

## 2018-05-10 NOTE — [facility] Group Notes (Signed)
[facility] Group Therapy      05/10/2018 2:11 PM  Type of Therapy: Mental Health Association Presentation   Summary of Progress/Problems: Invited, chose not to attend.    Alcario Drought Clinical Social Worker
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
## 2018-05-10 NOTE — [facility] Suicide Risk Assessment (Signed)
[facility] INPATIENT:  Family/Significant Other Suicide Prevention Education  Suicide Prevention Education:  Patient Refusal for Family/Significant Other Suicide Prevention Education: The patient Jared Beck has refused to provide written consent for family/significant other to be provided Family/Significant Other Suicide Prevention Education during admission and/or prior to discharge.  Physician notified.  SPE completed with patient, as patient refused to consent to family contact. Patient was encouraged to share information with support network, ask questions, and talk about any concerns relating to SPE. Patient denies access to guns/firearms and verbalized understanding of information provided. Mobile Crisis information also provided to patient.    Maeola Sarah 05/10/2018, 8:35 AM

## 2018-05-10 NOTE — Progress Notes (Signed)
Recreation Therapy Notes  Date: 10.23.19 Time: 0930 Location: [address] Dayroom  Group Topic: Stress Management  Goal Area(s) Addresses:  Patient will verbalize importance of using healthy stress management.  Patient will identify positive emotions associated with healthy stress management.   Intervention: Stress Management  Activity :  Guided Imagery.  LRT introduced the stress management technique of guided imagery.  LRT read a script to allow patients to envision their peaceful place.  Patients were to follow along as script was read to engage in the activity.  Education:  Stress Management, Discharge Planning.   Education Outcome: Acknowledges edcuation/In group clarification offered/Needs additional education  Clinical Observations/Feedback: Pt did not attend group.      Caroll Rancher, LRT/CTRS         Caroll Rancher A 05/10/2018 11:24 AM

## 2018-05-11 MED ORDER — INFLUENZA VAC SPLIT QUAD 0.5 ML IM SUSY
0.5000 mL | PREFILLED_SYRINGE | INTRAMUSCULAR | Status: AC
  Administered 2018-05-12: 0.5 mL via INTRAMUSCULAR
  Filled 2018-05-11: qty 0.5

## 2018-05-11 NOTE — Progress Notes (Signed)
D:  Patient's self inventory sheet, patient sleeps good, no sleep medication.  Good appetite, normal energy level, good concentration.  Rated depression 5, hopeless 4, anxiety 6.  Denied withdrawals.  SI, contracts for safety, no plan.  Denied physical problems.  Physical pain, back and feet.  Goal is forget the past.  Plans to talk to other people.  Does have discharge plans. A:  Medications administered per MD orders.  Emotional support and encouragement given patient. R:  Denied SI and HI, contracts for safety while talking to nurse today, no plan to hurt himself.  Denied A/V hallucinations.  Safety maintained with 15 minute checks.

## 2018-05-11 NOTE — Progress Notes (Signed)
[facility] MD Progress Note  05/11/2018 10:57 AM Jared Beck  MRN:  [id number] Subjective:  Patient is seen and examined. Patient is a 57 year old male with a past psychiatric history significant for major depression who presented to the [facility] emergency department on 05/09/2018 with suicidal ideation. The patient stated that he had been depressed over the last 4 months. He stated that 2 days prior to admission his girlfriend had kicked him out of their house. She had gotten involved with drugs over the last several months and years, and he ran off her drug addict friends. Then everyone in the home including his girlfriend and girlfriend's mother tried to getting up on him. They kicked him out of the house. He he is essentially homeless. He stated he walked the streets for 2 days and then became increasingly more depressed and suicidal. He felt as though he was worthless, and he missed being at home with his girlfriend. He also it admitted that he was sad that they were keeping his dog. He had one previous episode of depression in the past where he was successfully treated with therapy and fluoxetine. He admitted to helplessness, hopelessness, worthlessness, despair and depressed mood. He was tearful throughout the interview. He was admitted to the hospital for evaluation and stabilization.  Patient is seen and examined.  Patient is a 57 year old male with a past psychiatric history significant for major depression who is seen in follow-up.  He stated he is doing well today.  He stated he was not having suicidal ideation.  He continues to work on housing.  He was talking with his sister this morning, and she is unable to provide any count is short-term housing for him.  We discussed the possibility of going to the homeless shelter in [place].  He is in agreement with that.  He stated his mood is somewhat improved, he is not suicidal, and not having any side effects to his current medications.   His vital signs are stable, he is afebrile.  He slept 6.75 hours last night.  Review of his laboratories revealed no significant abnormality.  His EKG revealed a normal sinus rhythm.  Principal Problem: Major depression, recurrent (HCC) Diagnosis:   Patient Active Problem List   Diagnosis Date Noted  . Major depression, recurrent (HCC) [F33.9] 05/09/2018  . 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 Pain in joint, shoulder region [M25.519] 04/27/2013  . Muscle weakness (generalized) [M62.81] 04/27/2013   Total Time spent with patient: 15 minutes  Past Psychiatric History: See admission H&P  Past Medical History:  Past Medical History:  Diagnosis Date  . Anxiety   . Arthritis   . Arthritis   . Complication of anesthesia   . DDD (degenerative disc disease), lumbar   . Depression   . GERD (gastroesophageal reflux disease)    occ  . Gout   . Gout   . PONV (postoperative nausea and vomiting)    nausea    Past Surgical History:  Procedure Laterality Date  . APPENDECTOMY  93  . BACK SURGERY    . CHOLECYSTECTOMY  10  . HERNIA REPAIR  93   umbilical, groin as child  . REVERSE SHOULDER ARTHROPLASTY Left 12/13/2014   Procedure: LEFT REVERSE TOTAL SHOULDER ARTHROPLASTY;  Surgeon: Beverely Low, MD;  Location: [facility] OR;  Service: Orthopedics;  Laterality: Left;  . rotator cuff surgery Left 16   Family History:  Family History  Problem Relation Age  of Onset  . Cancer Mother   . Cancer Father   . Heart disease Father   . Arrhythmia Brother        Needs pacer  . Stroke Brother    Family Psychiatric  History: See admission H&P Social History:  Social History   Substance and Sexual Activity  Alcohol Use Never  . Frequency: Never     Social History   Substance and Sexual Activity  Drug Use Never    Social History   Socioeconomic History  . Marital status: Single    Spouse name: Not on file  . Number of  children: Not on file  . Years of education: 8th grade  . Highest education level: 8th grade  Occupational History  . Occupation: farmer  Social Needs  . Financial resource strain: Not on file  . Food insecurity:    Worry: Sometimes true    Inability: Sometimes true  . Transportation needs:    Medical: No    Non-medical: No  Tobacco Use  . Smoking status: Never Smoker  . Smokeless tobacco: Never Used  Substance and Sexual Activity  . Alcohol use: Never    Frequency: Never  . Drug use: Never  . Sexual activity: Not Currently  Lifestyle  . Physical activity:    Days per week: 0 days    Minutes per session: Not on file  . Stress: Very much  Relationships  . Social connections:    Talks on phone: Once a week    Gets together: Once a week    Attends religious service: Never    Active member of club or organization: No    Attends meetings of clubs or organizations: Never    Relationship status: Never married  Other Topics Concern  . Not on file  Social History Narrative   Girlfriend of 10 yrs made him leave the home.   Additional Social History:    Pain Medications: see MAR Prescriptions: see MAR Over the Counter: see MAR History of alcohol / drug use?: No history of alcohol / drug abuse Longest period of sobriety (when/how long): never used drugs/alcohol/tobacco Negative Consequences of Use: Personal relationships, Financial Withdrawal Symptoms: Other (Comment)(no withdrawals)                    Sleep: Good  Appetite:  Good  Current Medications: Current Facility-Administered Medications  Medication Dose Route Frequency Provider Last Rate Last Dose  . albuterol (PROVENTIL HFA;VENTOLIN HFA) 108 (90 Base) MCG/ACT inhaler 1-2 puff  1-2 puff Inhalation Q6H PRN Antonieta Pert, MD      . allopurinol (ZYLOPRIM) tablet 100 mg  100 mg Oral Daily Antonieta Pert, MD   100 mg at 05/11/18 0733  . alum & mag hydroxide-simeth (MAALOX/MYLANTA) 200-200-20 MG/5ML  suspension 30 mL  30 mL Oral Q4H PRN Antonieta Pert, MD      . cholecalciferol (VITAMIN D) tablet 1,000 Units  1,000 Units Oral Daily Antonieta Pert, MD   1,000 Units at 05/11/18 0734  . colchicine tablet 0.6 mg  0.6 mg Oral Daily PRN Antonieta Pert, MD      . cyclobenzaprine (FLEXERIL) tablet 10 mg  10 mg Oral TID Antonieta Pert, MD   10 mg at 05/11/18 0734  . FLUoxetine (PROZAC) capsule 10 mg  10 mg Oral Daily Antonieta Pert, MD   10 mg at 05/11/18 0734  . gabapentin (NEURONTIN) capsule 300 mg  300 mg Oral TID Cobos, Rockey Situ,  MD   300 mg at 05/11/18 0734  . hydrOXYzine (ATARAX/VISTARIL) tablet 25 mg  25 mg Oral TID PRN Antonieta Pert, MD      . magnesium hydroxide (MILK OF MAGNESIA) suspension 30 mL  30 mL Oral Daily PRN Antonieta Pert, MD      . naproxen (NAPROSYN) tablet 500 mg  500 mg Oral BID WC Antonieta Pert, MD   500 mg at 05/11/18 0735  . oxyCODONE-acetaminophen (PERCOCET/ROXICET) 5-325 MG per tablet 1 tablet  1 tablet Oral Q8H PRN Antonieta Pert, MD      . pramipexole (MIRAPEX) tablet 0.5 mg  0.5 mg Oral QHS Antonieta Pert, MD   0.5 mg at 05/09/18 2136  . sulfamethoxazole-trimethoprim (BACTRIM,SEPTRA) 400-80 MG per tablet 2 tablet  2 tablet Oral Daily Antonieta Pert, MD   2 tablet at 05/11/18 0735  . tamsulosin (FLOMAX) capsule 0.4 mg  0.4 mg Oral QPC supper Antonieta Pert, MD   0.4 mg at 05/10/18 1709  . traZODone (DESYREL) tablet 50 mg  50 mg Oral QHS PRN Antonieta Pert, MD        Lab Results: No results found for this or any previous visit (from the past 48 hour(s)).  Blood Alcohol level:  Lab Results  Component Value Date   ETH <10 05/08/2018    Metabolic Disorder Labs: No results found for: HGBA1C, MPG No results found for: PROLACTIN No results found for: CHOL, TRIG, HDL, CHOLHDL, VLDL, LDLCALC  Physical Findings: AIMS: Facial and Oral Movements Muscles of Facial Expression: None, normal Lips and Perioral Area:  None, normal Jaw: None, normal Tongue: None, normal,Extremity Movements Upper (arms, wrists, hands, fingers): None, normal Lower (legs, knees, ankles, toes): None, normal, Trunk Movements Neck, shoulders, hips: None, normal, Overall Severity Severity of abnormal movements (highest score from questions above): None, normal Incapacitation due to abnormal movements: None, normal Patient's awareness of abnormal movements (rate only patient's report): No Awareness, Dental Status Current problems with teeth and/or dentures?: No Does patient usually wear dentures?: No  CIWA:  CIWA-Ar Total: 3 COWS:  COWS Total Score: 3  Musculoskeletal: Strength & Muscle Tone: within normal limits Gait & Station: normal Patient leans: N/A  Psychiatric Specialty Exam: Physical Exam  Nursing note and vitals reviewed. Constitutional: He is oriented to person, place, and time. He appears well-developed and well-nourished.  HENT:  Head: Normocephalic and atraumatic.  Respiratory: Effort normal.  Neurological: He is alert and oriented to person, place, and time.    ROS  Blood pressure 96/75, pulse (!) 113, temperature 97.8 F (36.6 C), temperature source Oral, resp. rate 20, height 5\' 7"  (1.702 m), weight 77.6 kg, SpO2 100 %.Body mass index is 26.78 kg/m.  General Appearance: Casual  Eye Contact:  Fair  Speech:  Normal Rate  Volume:  Normal  Mood:  Anxious  Affect:  Congruent  Thought Process:  Coherent and Descriptions of Associations: Intact  Orientation:  Full (Time, Place, and Person)  Thought Content:  Logical  Suicidal Thoughts:  No  Homicidal Thoughts:  No  Memory:  Immediate;   Fair Recent;   Fair Remote;   Fair  Judgement:  Fair  Insight:  Fair  Psychomotor Activity:  Normal  Concentration:  Concentration: Fair and Attention Span: Fair  Recall:  [employer] of Knowledge:  Fair  Language:  Fair  Akathisia:  Negative  Handed:  Right  AIMS (if indicated):     Assets:  Communication  Skills Desire for  Improvement Financial Resources/Insurance Resilience  ADL's:  Intact  Cognition:  WNL  Sleep:  Number of Hours: 6.75     Treatment Plan Summary: Daily contact with patient to assess and evaluate symptoms and progress in treatment, Medication management and Plan : Patient is seen and examined.  Patient is a 57 year old male with the above-stated past psychiatric history who is seen in follow-up.  #1 major depression-continue fluoxetine 10 mg p.o. daily for mood.  #2 generalized anxiety disorder-continue Neurontin 300 mg p.o. 3 times daily, continue hydroxyzine 25 mg p.o. every 6 hours as needed anxiety.  #3 chronic pain/chronic spinal infection-continue Bactrim DS, continue oxycodone for pain related to back and infection, continue Naprosyn 500 mg p.o. twice daily, continue Neurontin 300 mg p.o. 3 times daily.  #3 gout-continue allopurinol and colchicine for prophylaxis.  #4 muscle spasms-continue Flexeril 10 mg p.o. 3 times daily.  #5 restless leg syndrome-continue Mirapex 0.5 mg p.o. nightly.  #6 disposition planning-he continues to work on housing issues.  We may have to send him to the [facility] homeless shelter if all else fails.  I anticipate discharge in 1 to 2 days.  Antonieta Pert, MD 05/11/2018, 10:57 AM

## 2018-05-11 NOTE — [facility] Group Notes (Signed)
LCSW Group Therapy Note 05/11/2018 4:05 PM  Type of Therapy and Topic: Group Therapy: Avoiding Self-Sabotaging and Enabling Behaviors  Participation Level: Active  Description of Group:  In this group, patients will learn how to identify obstacles, self-sabotaging and enabling behaviors, as well as: what are they, why do we do them and what needs these behaviors meet. Discuss unhealthy relationships and how to have positive healthy boundaries with those that sabotage and enable. Explore aspects of self-sabotage and enabling in yourself and how to limit these self-destructive behaviors in everyday life.  Therapeutic Goals: 1. Patient will identify one obstacle that relates to self-sabotage and enabling behaviors 2. Patient will identify one personal self-sabotaging or enabling behavior they did prior to admission 3. Patient will state a plan to change the above identified behavior 4. Patient will demonstrate ability to communicate their needs through discussion and/or role play.   Summary of Patient Progress:  Jared Beck was engaged and participated throughout the group session. Jared Beck states that his self sabotaging behavior is negative thinking and hanging onto negative relationships.     Therapeutic Modalities:  Cognitive Behavioral Therapy Person-Centered Therapy Motivational Interviewing   Jared Beck LCSWA Clinical Social Worker

## 2018-05-11 NOTE — Progress Notes (Signed)
Pt reports he had a good day.  He says the medications seem to be helping his anxiety.  He denies SI/HI/AVH at this time.  He has been observed sitting in the dayroom most of the evening talking with other patients and interacting appropriately.  Pt makes his needs known to staff.  Pt voices no concerns at this time.  He has attended all the groups today.  Pt is pleasant and cooperative with staff.  Support and encouragement offered.  Discharge plans are in process.  Pt plans to stay in a shelter in [place] when a bed is available, then when his check comes he says he will get a place to stay.  He also says he will get law enforcement to go with him to get his belongings and his dog.  Safety maintained with q15 minute checks.

## 2018-05-11 NOTE — Plan of Care (Signed)
Nurse discussed anxiety, depression, coping skills with patient. 

## 2018-05-12 NOTE — Progress Notes (Signed)
Recreation Therapy Notes  Date: 10.25.19 Time: 0930 Location: [address] Hall Dayroom  Group Topic: Stress Management  Goal Area(s) Addresses:  Patient will verbalize importance of using healthy stress management.  Patient will identify positive emotions associated with healthy stress management.   Intervention: Stress Management  Activity :  Meditation. LRT introduced the stress management technique of meditation.  LRT played Beck meditation for patients to engage in Beck meditation on gratitude.  Patients were to listen and follow along as the meditation was played.  Education:  Stress Management, Discharge Planning.   Education Outcome: Acknowledges edcuation/In group clarification offered/Needs additional education  Clinical Observations/Feedback:  Pt did not attend group.    Sammie Denner, LRT/CTRS         Jared Beck 05/12/2018 11:48 AM 

## 2018-05-12 NOTE — [facility] Group Notes (Signed)
LCSW Group Therapy Note 05/12/2018 11:43 AM  Type of Therapy/Topic: Group Therapy: Emotion Regulation  Participation Level: Active   Description of Group:  The purpose of this group is to assist patients in learning to regulate negative emotions and experience positive emotions. Patients will be guided to discuss ways in which they have been vulnerable to their negative emotions. These vulnerabilities will be juxtaposed with experiences of positive emotions or situations, and patients will be challenged to use positive emotions to combat negative ones. Special emphasis will be placed on coping with negative emotions in conflict situations, and patients will process healthy conflict resolution skills.  Therapeutic Goals: 1. Patient will identify two positive emotions or experiences to reflect on in order to balance out negative emotions 2. Patient will label two or more emotions that they find the most difficult to experience 3. Patient will demonstrate positive conflict resolution skills through discussion and/or role plays  Summary of Patient Progress:  Amram was engaged and participated throughout the group session. Mayra states that he struggles with not knowing how to express his emotions. Levell reports that he tends to suppress his emotions which he feels causes him to become depressed.   Therapeutic Modalities:  Cognitive Behavioral Therapy Feelings Identification Dialectical Behavioral Therapy   Alcario Drought Clinical Social Worker

## 2018-05-12 NOTE — Plan of Care (Signed)
Progress Note  D: pt found in bed; compliant with medication administration. Pt states he slept well. Pt rates his depression/hopelessness/anxiety a 3/2/2 out of 10 respectively. Pt denies any physical pain or problems, rating his pain a 0/10. Pt states his goal for today is to go home and he will achieve this by talking to the doctor. Pt denies any si/hi/ah/vh and verbally agrees to approach staff if these become apparent or before harming herself while at [facility]. Pt may be d/c'ed this weekend if he finds a place to stay.  A: pt provided support and encouragement. Pt given medications per protocol and standing orders. Q75m safety checks implemented and continued. R: pt safe on the unit. Will continue to monitor.   Pt progressing in the following metrics  Problem: Education: Goal: Knowledge of [facility] Education information/materials will improve Outcome: Progressing Goal: Mental status will improve Outcome: Progressing Goal: Verbalization of understanding the information provided will improve Outcome: Progressing   Problem: Activity: Goal: Interest or engagement in activities will improve Outcome: Progressing Goal: Sleeping patterns will improve Outcome: Progressing   Problem: Coping: Goal: Ability to verbalize frustrations and anger appropriately will improve Outcome: Progressing Goal: Ability to demonstrate self-control will improve Outcome: Progressing

## 2018-05-12 NOTE — Progress Notes (Signed)
[facility] MD Progress Note  05/12/2018 11:56 AM Jared Beck  MRN:  [id number] Subjective:  Patient is seen and examined. Patient is a 57 year old male with a past psychiatric history significant for major depression who presented to the [facility] emergency department on 05/09/2018 with suicidal ideation. The patient stated that he had been depressed over the last 4 months. He stated that 2 days prior to admission his girlfriend had kicked him out of their house. She had gotten involved with drugs over the last several months and years, and he ran off her drug addict friends. Then everyone in the home including his girlfriend and girlfriend's mother tried to getting up on him. They kicked him out of the house. He he is essentially homeless. He stated he walked the streets for 2 days and then became increasingly more depressed and suicidal. He felt as though he was worthless, and he missed being at home with his girlfriend. He also it admitted that he was sad that they were keeping his dog. He had one previous episode of depression in the past where he was successfully treated with therapy and fluoxetine. He admitted to helplessness, hopelessness, worthlessness, despair and depressed mood. He was tearful throughout the interview. He was admitted to the hospital for evaluation and stabilization.  Objective: Patient is seen and examined.  Patient is a 57 year old male with the above-stated past psychiatric history who is seen in follow-up.  He is doing well today.  He had no complaints.  He is still seeking housing.  Unfortunately he does not know the phone numbers of friends or family locally.  We discussed contacting his church she would paid for him to stay at hotel once previously, but he does not know their phone number either.  I recommended having someone look it up on the Internet so he can contact them.  We discussed the fact that he may end up homeless because he would have to present himself  to the homeless shelter when they had a bed available.  Social work is helping him with this.  His vital signs are stable, he is afebrile.  He slept 6.75 hours last night.  He denied any side effects to his current medications.  Principal Problem: Major depression, recurrent (HCC) Diagnosis:   Patient Active Problem List   Diagnosis Date Noted  . Major depression, recurrent (HCC) [F33.9] 05/09/2018  . 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 Pain in joint, shoulder region [M25.519] 04/27/2013  . Muscle weakness (generalized) [M62.81] 04/27/2013   Total Time spent with patient: 15 minutes  Past Psychiatric History: See admission H&P  Past Medical History:  Past Medical History:  Diagnosis Date  . Anxiety   . Arthritis   . Arthritis   . Complication of anesthesia   . DDD (degenerative disc disease), lumbar   . Depression   . GERD (gastroesophageal reflux disease)    occ  . Gout   . Gout   . PONV (postoperative nausea and vomiting)    nausea    Past Surgical History:  Procedure Laterality Date  . APPENDECTOMY  93  . BACK SURGERY    . CHOLECYSTECTOMY  10  . HERNIA REPAIR  93   umbilical, groin as child  . REVERSE SHOULDER ARTHROPLASTY Left 12/13/2014   Procedure: LEFT REVERSE TOTAL SHOULDER ARTHROPLASTY;  Surgeon: Beverely Low, MD;  Location: [facility] OR;  Service: Orthopedics;  Laterality: Left;  . rotator cuff surgery Left  14   Family History:  Family History  Problem Relation Age of Onset  . Cancer Mother   . Cancer Father   . Heart disease Father   . Arrhythmia Brother        Needs pacer  . Stroke Brother    Family Psychiatric  History: See admission H&P Social History:  Social History   Substance and Sexual Activity  Alcohol Use Never  . Frequency: Never     Social History   Substance and Sexual Activity  Drug Use Never    Social History   Socioeconomic History  . Marital status: Single     Spouse name: Not on file  . Number of children: Not on file  . Years of education: 8th grade  . Highest education level: 8th grade  Occupational History  . Occupation: farmer  Social Needs  . Financial resource strain: Not on file  . Food insecurity:    Worry: Sometimes true    Inability: Sometimes true  . Transportation needs:    Medical: No    Non-medical: No  Tobacco Use  . Smoking status: Never Smoker  . Smokeless tobacco: Never Used  Substance and Sexual Activity  . Alcohol use: Never    Frequency: Never  . Drug use: Never  . Sexual activity: Not Currently  Lifestyle  . Physical activity:    Days per week: 0 days    Minutes per session: Not on file  . Stress: Very much  Relationships  . Social connections:    Talks on phone: Once a week    Gets together: Once a week    Attends religious service: Never    Active member of club or organization: No    Attends meetings of clubs or organizations: Never    Relationship status: Never married  Other Topics Concern  . Not on file  Social History Narrative   Girlfriend of 10 yrs made him leave the home.   Additional Social History:    Pain Medications: see MAR Prescriptions: see MAR Over the Counter: see MAR History of alcohol / drug use?: No history of alcohol / drug abuse Longest period of sobriety (when/how long): never used drugs/alcohol/tobacco Negative Consequences of Use: Personal relationships, Financial Withdrawal Symptoms: Other (Comment)(no withdrawals)                    Sleep: Good  Appetite:  Good  Current Medications: Current Facility-Administered Medications  Medication Dose Route Frequency Provider Last Rate Last Dose  . albuterol (PROVENTIL HFA;VENTOLIN HFA) 108 (90 Base) MCG/ACT inhaler 1-2 puff  1-2 puff Inhalation Q6H PRN Antonieta Pert, MD      . allopurinol (ZYLOPRIM) tablet 100 mg  100 mg Oral Daily Antonieta Pert, MD   100 mg at 05/12/18 0744  . alum & mag  hydroxide-simeth (MAALOX/MYLANTA) 200-200-20 MG/5ML suspension 30 mL  30 mL Oral Q4H PRN Antonieta Pert, MD      . cholecalciferol (VITAMIN D) tablet 1,000 Units  1,000 Units Oral Daily Antonieta Pert, MD   1,000 Units at 05/12/18 [phone]  . colchicine tablet 0.6 mg  0.6 mg Oral Daily PRN Antonieta Pert, MD      . cyclobenzaprine (FLEXERIL) tablet 10 mg  10 mg Oral TID Antonieta Pert, MD   10 mg at 05/12/18 0744  . FLUoxetine (PROZAC) capsule 10 mg  10 mg Oral Daily Antonieta Pert, MD   10 mg at 05/12/18 0744  . gabapentin (  NEURONTIN) capsule 300 mg  300 mg Oral TID Cobos, Rockey Situ, MD   300 mg at 05/12/18 0744  . hydrOXYzine (ATARAX/VISTARIL) tablet 25 mg  25 mg Oral TID PRN Antonieta Pert, MD      . magnesium hydroxide (MILK OF MAGNESIA) suspension 30 mL  30 mL Oral Daily PRN Antonieta Pert, MD      . naproxen (NAPROSYN) tablet 500 mg  500 mg Oral BID WC Antonieta Pert, MD   500 mg at 05/12/18 0744  . oxyCODONE-acetaminophen (PERCOCET/ROXICET) 5-325 MG per tablet 1 tablet  1 tablet Oral Q8H PRN Antonieta Pert, MD      . pramipexole (MIRAPEX) tablet 0.5 mg  0.5 mg Oral QHS Antonieta Pert, MD   0.5 mg at 05/11/18 2106  . sulfamethoxazole-trimethoprim (BACTRIM,SEPTRA) 400-80 MG per tablet 2 tablet  2 tablet Oral Daily Antonieta Pert, MD   2 tablet at 05/12/18 0744  . tamsulosin (FLOMAX) capsule 0.4 mg  0.4 mg Oral QPC supper Antonieta Pert, MD   0.4 mg at 05/11/18 1946  . traZODone (DESYREL) tablet 50 mg  50 mg Oral QHS PRN Antonieta Pert, MD        Lab Results: No results found for this or any previous visit (from the past 48 hour(s)).  Blood Alcohol level:  Lab Results  Component Value Date   ETH <10 05/08/2018    Metabolic Disorder Labs: No results found for: HGBA1C, MPG No results found for: PROLACTIN No results found for: CHOL, TRIG, HDL, CHOLHDL, VLDL, LDLCALC  Physical Findings: AIMS: Facial and Oral Movements Muscles of Facial  Expression: None, normal Lips and Perioral Area: None, normal Jaw: None, normal Tongue: None, normal,Extremity Movements Upper (arms, wrists, hands, fingers): None, normal Lower (legs, knees, ankles, toes): None, normal, Trunk Movements Neck, shoulders, hips: None, normal, Overall Severity Severity of abnormal movements (highest score from questions above): None, normal Incapacitation due to abnormal movements: None, normal Patient's awareness of abnormal movements (rate only patient's report): No Awareness, Dental Status Current problems with teeth and/or dentures?: No Does patient usually wear dentures?: No  CIWA:  CIWA-Ar Total: 2 COWS:  COWS Total Score: 2  Musculoskeletal: Strength & Muscle Tone: within normal limits Gait & Station: normal Patient leans: N/A  Psychiatric Specialty Exam: Physical Exam  Nursing note and vitals reviewed. Constitutional: He is oriented to person, place, and time. He appears well-developed and well-nourished.  HENT:  Head: Normocephalic and atraumatic.  Respiratory: Effort normal.  Neurological: He is alert and oriented to person, place, and time.    ROS  Blood pressure 115/79, pulse (!) 104, temperature 97.6 F (36.4 C), temperature source Oral, resp. rate 20, height 5\' 7"  (1.702 m), weight 77.6 kg, SpO2 100 %.Body mass index is 26.78 kg/m.  General Appearance: Casual  Eye Contact:  Fair  Speech:  Normal Rate  Volume:  Normal  Mood:  Anxious  Affect:  Congruent  Thought Process:  Coherent and Descriptions of Associations: Intact  Orientation:  Full (Time, Place, and Person)  Thought Content:  Logical  Suicidal Thoughts:  No  Homicidal Thoughts:  No  Memory:  Immediate;   Fair Recent;   Fair Remote;   Fair  Judgement:  Intact  Insight:  Fair  Psychomotor Activity:  Normal  Concentration:  Concentration: Fair and Attention Span: Fair  Recall:  [employer] of Knowledge:  Fair  Language:  Fair  Akathisia:  Negative  Handed:  Right   AIMS (  if indicated):     Assets:  Desire for Improvement Physical Health Resilience  ADL's:  Intact  Cognition:  WNL  Sleep:  Number of Hours: 6.75     Treatment Plan Summary: Daily contact with patient to assess and evaluate symptoms and progress in treatment, Medication management and Plan : Patient is seen and examined.  Patient is a 57 year old male with the above-stated past psychiatric history who is seen in follow-up.  #1 major depression-continue fluoxetine 10 mg p.o. daily for mood.  #2 generalized anxiety disorder-continue Neurontin 300 mg p.o. 3 times daily, continue hydroxyzine 25 mg p.o. every 6 hours as needed anxiety.  #3 chronic pain/chronic spinal infection-continue Bactrim DS, continue oxycodone for pain related to back and infection, continue Naprosyn 500 mg p.o. twice daily, continue Neurontin 300 mg p.o. 3 times daily.  #3 gout-continue allopurinol and colchicine for prophylaxis.  #4 muscle spasms-continue Flexeril 10 mg p.o. 3 times daily.  #5 restless leg syndrome-continue Mirapex 0.5 mg p.o. nightly.  #6 disposition planning-he continues to work on housing issues.  We may have to send him to the [facility] homeless shelter if all else fails.  I anticipate discharge in 1 to 2 days.  Antonieta Pert, MD 05/12/2018, 11:56 AM

## 2018-05-13 MED ORDER — HYDROXYZINE HCL 25 MG PO TABS: 25.0000 mg | ORAL_TABLET | Freq: Three times a day (TID) | ORAL | 0 refills | Status: DC | PRN

## 2018-05-13 MED ORDER — FLUOXETINE HCL 10 MG PO CAPS: 10.0000 mg | ORAL_CAPSULE | Freq: Every day | ORAL | 0 refills | Status: DC

## 2018-05-13 MED ORDER — TRAZODONE HCL 50 MG PO TABS: 50.0000 mg | ORAL_TABLET | Freq: Every evening | ORAL | 0 refills | Status: DC | PRN

## 2018-05-13 MED ORDER — ALLOPURINOL 100 MG PO TABS: 100.0000 mg | ORAL_TABLET | Freq: Every day | ORAL | 0 refills | Status: DC

## 2018-05-13 NOTE — Progress Notes (Signed)
Patient verbalized that he initially had a good start to his day but was unhappy that his discharge was cancelled. He states that he is presently homeless and that he understood that it will take time for the staff to find adequate resources for him. His goal for tomorrow is to speak with the social worker about housing.

## 2018-05-13 NOTE — Progress Notes (Signed)
  [facility] Adult Case Management Discharge Plan :  Will you be returning to the same living situation after discharge:  No.  Plans to ask a friend if he can stay there until his check arrives in 6 days. At discharge, do you have transportation home?: No.  CSW to work on this issue. Do you have the ability to pay for your medications: Yes,  insurance, income  Release of information consent forms completed and turned in to Medical Records by CSW.   Patient to Follow up at: Follow-up Information    Services, [facility]. Go on 05/15/2018.   Why:  Appointment for medication management and therapy services is Monday, 04/2818 at 9:00am. Please be sure to bring your Photo ID, SSN, both insurance cards(Medicare and [employer]), proof of income and any discharge paperwork including your meds.   Contact information: [address] [place] [address] [phone]           Next level of care provider has access to [facility] Link:no  Safety Planning and Suicide Prevention discussed: Yes,  with patient only, who said he had nobody else with whom CSW could review the information  Have you used any form of tobacco in the last 30 days? (Cigarettes, Smokeless Tobacco, Cigars, and/or Pipes): No  Has patient been referred to the Quitline?: N/A patient is not a smoker  Patient has been referred for addiction treatment: N/A  Lynnell Chad, LCSW 05/13/2018, 1:35 PM

## 2018-05-13 NOTE — Progress Notes (Signed)
D: Patient observed in dayroom this evening however contact with peers is limited. Patient states he has had a good day but was disappointed he didn't discharge.  Patient's affect flat, mood depressed however pleasant. Denies pain, physical complaints.   A: Medicated per orders, no prns requested or needed. Medication education provided. Level III obs in place for safety. Emotional support offered. Patient encouraged to complete Suicide Safety Plan before discharge. Encouraged to attend and participate in unit programming.     R: Patient verbalizes understanding of POC. Patient denies SI/HI/AVH and remains safe on level III obs. Will continue to monitor throughout the night.

## 2018-05-13 NOTE — Plan of Care (Addendum)
D: Pt A & O X 3. Denies SI, HI, AVH and pain at this time. He slept well last night and did not request medication to help with sleep. His appetite is good, energy low and concentration good. He rates his depression 3/10, feeling of hopelessness 4/10 and anxiety 8/10. He denies withdrawal symptoms. Lower back pain 5/10. D/C to friend's home as ordered. Picked up in lobby by [facility] transport for transport back to [facility]. A: D/C instructions reviewed with pt including prescriptions and follow up appointment, compliance encouraged. All belongings from locker #29 given to pt at time of departure. Scheduled and PRN medications given with verbal education and effects monitored. Safety checks maintained without incident till time of d/c.  R: Pt receptive to care. Compliant with medications when offered. Denies adverse drug reactions when assessed. Verbalized understanding related to d/c instructions. Signed belonging sheet in agreement with items received from locker. Ambulatory with a steady gait. Appears to be in no physical distress at time of departure. Goal: "Going home" and "talk to the Child psychotherapist."

## 2018-05-13 NOTE — Progress Notes (Signed)
Adult Psychoeducational Group Note  Date:  05/13/2018 Time:  8:30AM-9:15AM  Group Topic/Focus:  Goals Group:   The focus of this group is to help patients establish daily goals to achieve during treatment and discuss how the patient can incorporate goal setting into their daily lives to aide in recovery.  Participation Level:  Active  Participation Quality:  Attentive  Affect:  Appropriate  Cognitive:  Alert  Insight: Good  Engagement in Group:  Engaged  Modes of Intervention:  Discussion  Additional Comments:  Patient informed the group that his goal for the day is to get back to [place]. MHT helped patient to reflect on the steps that he would need to take in order to accomplish this goal. Patient informed the group that he would work with the social work or find a place to stay on his own to assist him in getting back to [place].   Annye Asa 05/13/2018, 2:47 PM

## 2018-05-13 NOTE — BHH Suicide Risk Assessment (Signed)
[facility] Discharge Suicide Risk Assessment   Principal Problem: Major depression, recurrent [facility]) Discharge Diagnoses:  Patient Active Problem List   Diagnosis Date Noted  . Major depression, recurrent (HCC) [F33.9] 05/09/2018  . 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 Pain in joint, shoulder region [M25.519] 04/27/2013  . Muscle weakness (generalized) [M62.81] 04/27/2013    Total Time spent with patient: 15 minutes  Musculoskeletal: Strength & Muscle Tone: within normal limits Gait & Station: normal Patient leans: N/A  Psychiatric Specialty Exam: Review of Systems  All other systems reviewed and are negative.   Blood pressure (!) 79/58, pulse 97, temperature (!) 97.5 F (36.4 C), temperature source Oral, resp. rate 16, height 5\' 7"  (1.702 m), weight 77.6 kg, SpO2 100 %.Body mass index is 26.78 kg/m.  General Appearance: Casual  Eye Contact::  Good  Speech:  Normal Rate409  Volume:  Normal  Mood:  Euthymic  Affect:  Congruent  Thought Process:  Coherent and Descriptions of Associations: Intact  Orientation:  Full (Time, Place, and Person)  Thought Content:  Logical  Suicidal Thoughts:  No  Homicidal Thoughts:  No  Memory:  Immediate;   Fair Recent;   Fair Remote;   Fair  Judgement:  Intact  Insight:  Fair  Psychomotor Activity:  Normal  Concentration:  Fair  Recall:  [employer] of Knowledge:Fair  Language: Fair  Akathisia:  Negative  Handed:  Right  AIMS (if indicated):     Assets:  Desire for Improvement Leisure Time Resilience  Sleep:  Number of Hours: 6.5  Cognition: WNL  ADL's:  Intact   Mental Status Per Nursing Assessment::   On Admission:  Suicidal ideation indicated by patient, Self-harm thoughts  Demographic Factors:  Male, Caucasian, Low socioeconomic status and Unemployed  Loss Factors: Loss of significant relationship  Historical Factors: Impulsivity  Risk Reduction Factors:    Religious beliefs about death  Continued Clinical Symptoms:  Depression:   Impulsivity  Cognitive Features That Contribute To Risk:  None    Suicide Risk:  Minimal: No identifiable suicidal ideation.  Patients presenting with no risk factors but with morbid ruminations; may be classified as minimal risk based on the severity of the depressive symptoms  Follow-up Information    Services, [facility]. Go on 05/15/2018.   Why:  Appointment for medication management and therapy services is Monday, 04/2818 at 9:00am. Please be sure to bring your Photo ID, SSN, both insurance cards(Medicare and [employer]), proof of income and any discharge paperwork including your meds.   Contact information: [address] [place] [address] [phone]           Plan Of Care/Follow-up recommendations:  Activity:  ad lib  Antonieta Pert, MD 05/13/2018, 12:53 PM
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
## 2018-05-15 ENCOUNTER — Encounter (HOSPITAL_COMMUNITY): Payer: Self-pay | Admitting: Emergency Medicine

## 2018-05-15 ENCOUNTER — Other Ambulatory Visit: Payer: Self-pay

## 2018-05-15 ENCOUNTER — Emergency Department (HOSPITAL_COMMUNITY)
Admission: EM | Admit: 2018-05-15 | Discharge: 2018-05-15 | Disposition: A | Discharge status: Home / Self Care | Payer: Medicare Other | Attending: Emergency Medicine | Admitting: Emergency Medicine

## 2018-05-15 DIAGNOSIS — R609 Edema, unspecified: Secondary | ICD-10-CM | POA: Diagnosis not present

## 2018-05-15 DIAGNOSIS — R2242 Localized swelling, mass and lump, left lower limb: Secondary | ICD-10-CM | POA: Diagnosis present

## 2018-05-15 DIAGNOSIS — Z79899 Other long term (current) drug therapy: Secondary | ICD-10-CM | POA: Insufficient documentation

## 2018-05-15 LAB — BASIC METABOLIC PANEL
ANION GAP: 8 (ref 5–15)
BUN: 13 mg/dL (ref 6–20)
CHLORIDE: 94 mmol/L — AB (ref 98–111)
CO2: 25 mmol/L (ref 22–32)
CREATININE: 1.01 mg/dL (ref 0.61–1.24)
Calcium: 8.7 mg/dL — ABNORMAL LOW (ref 8.9–10.3)
GFR calc non Af Amer: >60 mL/min (ref >60)
Glucose, Bld: 74 mg/dL (ref 70–99)
Potassium: 4.4 mmol/L (ref 3.5–5.1)
SODIUM: 127 mmol/L — AB (ref 135–145)

## 2018-05-15 LAB — CBC
HEMATOCRIT: 36.7 % — AB (ref 39.0–52.0)
Hemoglobin: 12.5 g/dL — ABNORMAL LOW (ref 13.0–17.0)
MCH: 30.5 pg (ref 26.0–34.0)
MCHC: 34.1 g/dL (ref 30.0–36.0)
MCV: 89.5 fL (ref 80.0–100.0)
NRBC: 0 % (ref 0.0–0.2)
PLATELETS: 197 10*3/uL (ref 150–400)
RBC: 4.1 MIL/uL — AB (ref 4.22–5.81)
RDW: 12.5 % (ref 11.5–15.5)
WBC: 4.6 10*3/uL (ref 4.0–10.5)

## 2018-05-15 MED ORDER — DOXYCYCLINE HYCLATE 100 MG PO CAPS: 100.0000 mg | ORAL_CAPSULE | Freq: Two times a day (BID) | ORAL | 0 refills | Status: DC

## 2018-05-15 MED ORDER — DEXAMETHASONE 4 MG PO TABS: 4.0000 mg | ORAL_TABLET | Freq: Two times a day (BID) | ORAL | 0 refills | Status: DC

## 2018-05-15 MED ORDER — ACETAMINOPHEN 500 MG PO TABS
1000.0000 mg | ORAL_TABLET | Freq: Once | ORAL | Status: AC
  Administered 2018-05-15: 1000 mg via ORAL
  Filled 2018-05-15: qty 2

## 2018-05-15 MED ORDER — PREDNISONE 20 MG PO TABS
40.0000 mg | ORAL_TABLET | Freq: Once | ORAL | Status: AC
  Administered 2018-05-15: 40 mg via ORAL
  Filled 2018-05-15: qty 2

## 2018-05-15 NOTE — Discharge Instructions (Addendum)
Your vital signs are within normal limits.  Your lab test are negative for acute changes, other than that your sodium is slightly low.  This should correct itself with your regular diet.  There is question of infection versus inflammation of your left lower leg.  If the redness increases, or there are red streaks coming up your leg, please feel the doxycycline prescription.  Please use Decadron 2 times daily.  Please use Tylenol extra strength every 4 hours for discomfort.  Please elevate your legs above your waist when you are sitting or lying down.  Please see Dr. Janna Arch for additional evaluation if not improving.

## 2018-05-15 NOTE — ED Provider Notes (Signed)
[facility] EMERGENCY DEPARTMENT Provider Note   CSN: [id number] Arrival date & time: 05/15/18  1643     History   Chief Complaint Chief Complaint  Patient presents with  . Leg Swelling    HPI Jared Beck is a 57 y.o. male.  Patient is a 57 year old male who presents to the emergency department with a complaint of leg swelling.  The patient states that he got up and started walking about 430 this morning.  He says he probably walked nearly 5 miles.  He says it is not unusual for him to do a lot of walking.  He says he walks around town a lot.  About 3 hours prior to his admission to the emergency department he says he started noticing swelling of his legs accompanied by some pain and discomfort.  He says that it seems as though the swelling was getting worse so he came to the emergency department for evaluation.  It is of note that the patient has arthritis and degenerative disc disease.  He is also been diagnosed with gout in the past.  He has not had any recent injury or trauma.  He has not had any abdominal pain, or any tearing or ripping type back pain.  He presents now for evaluation concerning his leg swelling and discomfort.  The history is provided by the patient.    Past Medical History:  Diagnosis Date  . Anxiety   . Arthritis   . Arthritis   . Complication of anesthesia   . DDD (degenerative disc disease), lumbar   . Depression   . GERD (gastroesophageal reflux disease)    occ  . Gout   . Gout   . PONV (postoperative nausea and vomiting)    nausea    Patient Active Problem List   Diagnosis Date Noted  . Major depression, recurrent (HCC) 05/09/2018  . 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 Complete rupture of rotator cuff 04/27/2013  . Pain in joint, shoulder region 04/27/2013  . Muscle weakness (generalized) 04/27/2013    Past Surgical History:  Procedure Laterality Date  . APPENDECTOMY  93  . BACK SURGERY    . CHOLECYSTECTOMY  10  . HERNIA  REPAIR  93   umbilical, groin as child  . REVERSE SHOULDER ARTHROPLASTY Left 12/13/2014   Procedure: LEFT REVERSE TOTAL SHOULDER ARTHROPLASTY;  Surgeon: Beverely Low, MD;  [facility]: [facility] OR;  Service: Orthopedics;  Laterality: Left;  . rotator cuff surgery Left 14        Home Medications    Prior to Admission medications   Medication Sig Start Date End Date Taking? Authorizing Provider  albuterol (PROVENTIL HFA;VENTOLIN HFA) 108 (90 Base) MCG/ACT inhaler Inhale 1-2 puffs into the lungs every 6 (six) hours as needed for wheezing or shortness of breath.     [provider]  allopurinol (ZYLOPRIM) 100 MG tablet Take 1 tablet (100 mg total) by mouth daily. 05/14/18   Money, Gerlene Burdock, FNP  Cholecalciferol (VITAMIN D3) 5000 units CAPS Take 5,000 Units by mouth daily.    [provider]  FLUoxetine (PROZAC) 10 MG capsule Take 1 capsule (10 mg total) by mouth daily. For mood control 05/14/18   Money, Gerlene Burdock, FNP  gabapentin (NEURONTIN) 300 MG capsule Take 300 mg by mouth 3 (three) times daily.    [provider]  hydrOXYzine (ATARAX/VISTARIL) 25 MG tablet Take 1 tablet (25 mg total) by mouth 3 (three) times daily as needed for anxiety. 05/13/18   Money,  Gerlene Burdock, FNP  levothyroxine (SYNTHROID, LEVOTHROID) 75 MCG tablet Take 75 mcg by mouth daily. 04/19/18   [provider]  naproxen (NAPROSYN) 500 MG tablet Take 500 mg by mouth 2 (two) times daily with a meal.    [provider]  oxyCODONE-acetaminophen (PERCOCET/ROXICET) 5-325 MG tablet Take 1 tablet by mouth 3 (three) times daily.  11/04/16   [provider]  pramipexole (MIRAPEX) 0.5 MG tablet Take 0.5 mg by mouth every evening.    [provider]  sulfamethoxazole-trimethoprim (BACTRIM DS,SEPTRA DS) 800-160 MG tablet Take 1 tablet by mouth daily. 04/19/18   [provider]  tamsulosin (FLOMAX) 0.4 MG CAPS capsule Take 0.4 mg by mouth daily.    [provider]    traZODone (DESYREL) 50 MG tablet Take 1 tablet (50 mg total) by mouth at bedtime as needed for sleep. 05/13/18   Money, Gerlene Burdock, FNP    Family History Family History  Problem Relation Age of Onset  . Cancer Mother   . Cancer Father   . Heart disease Father   . Arrhythmia Brother        Needs pacer  . Stroke Brother     Social History Social History   Tobacco Use  . Smoking status: Never Smoker  . Smokeless tobacco: Never Used  Substance Use Topics  . Alcohol use: Never    Frequency: Never  . Drug use: Never     Allergies   Cephalexin; Penicillins; and Vicodin [hydrocodone-acetaminophen]   Review of Systems Review of Systems  Constitutional: Negative for activity change.       All ROS Neg except as noted in HPI  HENT: Negative for nosebleeds.   Eyes: Negative for photophobia and discharge.  Respiratory: Negative for cough, shortness of breath and wheezing.   Cardiovascular: Negative for chest pain and palpitations.  Gastrointestinal: Negative for abdominal pain and blood in stool.  Genitourinary: Negative for dysuria, frequency and hematuria.  Musculoskeletal: Positive for arthralgias. Negative for back pain and neck pain.  Skin: Negative.   Neurological: Negative for dizziness, seizures and speech difficulty.  Psychiatric/Behavioral: Negative for confusion and hallucinations.     Physical Exam Updated Vital Signs BP 114/62 (BP Location: Right Arm)   Pulse 82   Temp 97.6 F (36.4 C) (Oral)   Resp 20   Ht 5\' 7"  (1.702 m)   Wt 82.6 kg   SpO2 100%   BMI 28.51 kg/m   Physical Exam  Constitutional: He is oriented to person, place, and time. He appears well-developed and well-nourished.  Non-toxic appearance.  HENT:  Head: Normocephalic.  Right Ear: Tympanic membrane and external ear normal.  Left Ear: Tympanic membrane and external ear normal.  Eyes: Pupils are equal, round, and reactive to light. EOM and lids are normal.  Neck: Normal range of motion.  Neck supple. Carotid bruit is not present.  Cardiovascular: Normal rate, regular rhythm, normal heart sounds, intact distal pulses and normal pulses.  Pulmonary/Chest: Breath sounds normal. No stridor. No respiratory distress. He has no wheezes. He has no rales. He exhibits no tenderness.  Abdominal: Soft. Bowel sounds are normal. There is no tenderness. There is no guarding.  Musculoskeletal: Normal range of motion.  Negative Homans sign There is trace to 1+ edema from the mid calf down to the foot.  There is deformity of the toes that suggest congenital abnormality.  No evidence of recent trauma.  Dorsalis pedis pulse is 2+.  Lymphadenopathy:       Head (  right side): No submandibular adenopathy present.       Head (left side): No submandibular adenopathy present.    He has no cervical adenopathy.  Neurological: He is alert and oriented to person, place, and time. He has normal strength. No cranial nerve deficit or sensory deficit.  Skin: Skin is warm and dry.  Psychiatric: He has a normal mood and affect. His speech is normal.  Nursing note and vitals reviewed.    ED Treatments / Results  Labs (all labs ordered are listed, but only abnormal results are displayed) Labs Reviewed  BASIC METABOLIC PANEL - Abnormal; Notable for the following components:      Result Value   Sodium 127 (*)    Chloride 94 (*)    Calcium 8.7 (*)    All other components within normal limits  CBC - Abnormal; Notable for the following components:   RBC 4.10 (*)    Hemoglobin 12.5 (*)    HCT 36.7 (*)    All other components within normal limits    EKG None  Radiology No results found.  Procedures Procedures (including critical care time)  Medications Ordered in ED Medications - No data to display   Initial Impression / Assessment and Plan / ED Course  I have reviewed the triage vital signs and the nursing notes.  Pertinent labs & imaging results that were available during my care of the  patient were reviewed by me and considered in my medical decision making (see chart for details).       Final Clinical Impressions(s) / ED Diagnose MDM  Vital signs reviewed.  Pulse oximetry is 100% on room air.  Within normal limits by my interpretation.  Patient speaks in complete sentences without problem.  Patient is ambulatory in the room, as well as in the hall without significant problem.  The patient has some swelling of the lower leg.  He has been up and about since about 430 this morning.  The examination favors a dependent edema.  There is also some question of inflammation versus early cellulitis of that left lower extremity.  The patient will be given a prescription for antibiotics to use if this should get worse.  Patient will also be treated with anti-inflammatory medications.  Patient to follow-up with Dr. Janna Arch or return to the emergency department if any changes in condition, problems, or concerns.   Final diagnoses:  Dependent edema    ED Discharge Orders         Ordered    doxycycline (VIBRAMYCIN) 100 MG capsule  2 times daily     05/15/18 2032    dexamethasone (DECADRON) 4 MG tablet  2 times daily with meals     05/15/18 2033           Ivery Quale, PA-C 05/15/18 2036    Donnetta Hutching, MD 05/15/18 2148

## 2018-05-15 NOTE — ED Triage Notes (Signed)
Pt c/ of bilateral leg swelling and pain x 1 day.

## 2018-06-11 ENCOUNTER — Other Ambulatory Visit: Payer: Self-pay

## 2018-06-11 ENCOUNTER — Encounter (HOSPITAL_COMMUNITY): Payer: Self-pay | Admitting: Emergency Medicine

## 2018-06-11 ENCOUNTER — Emergency Department (HOSPITAL_COMMUNITY)
Admission: EM | Admit: 2018-06-11 | Discharge: 2018-06-11 | Disposition: A | Discharge status: Home / Self Care | Payer: Medicare Other | Attending: Emergency Medicine | Admitting: Emergency Medicine

## 2018-06-11 DIAGNOSIS — M898X1 Other specified disorders of bone, shoulder: Secondary | ICD-10-CM | POA: Diagnosis not present

## 2018-06-11 DIAGNOSIS — Z79899 Other long term (current) drug therapy: Secondary | ICD-10-CM | POA: Insufficient documentation

## 2018-06-11 DIAGNOSIS — M549 Dorsalgia, unspecified: Secondary | ICD-10-CM | POA: Diagnosis present

## 2018-06-11 MED ORDER — KETOROLAC TROMETHAMINE 60 MG/2ML IM SOLN
60.0000 mg | Freq: Once | INTRAMUSCULAR | Status: AC
  Administered 2018-06-11: 60 mg via INTRAMUSCULAR
  Filled 2018-06-11: qty 2

## 2018-06-11 MED ORDER — NAPROXEN 250 MG PO TABS: ORAL_TABLET | ORAL | 0 refills | Status: DC

## 2018-06-11 MED ORDER — CYCLOBENZAPRINE HCL 5 MG PO TABS: 5.0000 mg | ORAL_TABLET | Freq: Three times a day (TID) | ORAL | 0 refills | Status: DC | PRN

## 2018-06-11 MED ORDER — DEXAMETHASONE SODIUM PHOSPHATE 10 MG/ML IJ SOLN
10.0000 mg | Freq: Once | INTRAMUSCULAR | Status: AC
  Administered 2018-06-11: 10 mg via INTRAMUSCULAR
  Filled 2018-06-11: qty 1

## 2018-06-11 MED ORDER — CYCLOBENZAPRINE HCL 10 MG PO TABS
5.0000 mg | ORAL_TABLET | Freq: Once | ORAL | Status: AC
  Administered 2018-06-11: 5 mg via ORAL
  Filled 2018-06-11: qty 1

## 2018-06-11 NOTE — ED Triage Notes (Signed)
Pt C/O mid back pain that started Monday "off and on." Pt reports having back surgery 1 yr ago. Pt has taken 1 5-375 percocet around 2000 last night with no relief.

## 2018-06-11 NOTE — ED Provider Notes (Signed)
[facility] EMERGENCY DEPARTMENT Provider Note   CSN: [id number] Arrival date & time: 06/11/18  0356  Time seen 04:58 AM  History   Chief Complaint Chief Complaint  Patient presents with  . Back Pain    HPI Jared Beck is a 57 y.o. male.  HPI patient states he had back surgery done in 2018 in April by Dr. Melven Sartorius in [place].  He states his scar starts from his thoracic spine between his shoulder blades all the way down to his "tailbone".  He states a month after surgery had a infection and they had to go he had an open it up and washed out again.  He states he was doing well.  He states he takes oxycodone for chronic pain.  He states 1 week ago he started getting sharp pain in his left shoulder blade area that radiates into his left chest wall.  He states it was off and on but became constant since noon yesterday.  He states he has tingling in his left hip to his left knee but no numbness.  He states he only has trouble walking because of the back pain.  He denies any weakness.  He denies any feeling that his legs are giving out.  He denies fever, nausea or vomiting.  He denies urinary or rectal incontinence.  PCP Oval Linsey, MD   Past Medical History:  Diagnosis Date  . Anxiety   . Arthritis   . Arthritis   . Complication of anesthesia   . DDD (degenerative disc disease), lumbar   . Depression   . GERD (gastroesophageal reflux disease)    occ  . Gout   . Gout   . PONV (postoperative nausea and vomiting)    nausea    Patient Active Problem List   Diagnosis Date Noted  . Major depression, recurrent (HCC) 05/09/2018  . 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 Complete rupture of rotator cuff 04/27/2013  . Pain in joint, shoulder region 04/27/2013  . Muscle weakness (generalized) 04/27/2013    Past Surgical History:  Procedure Laterality Date  . APPENDECTOMY  93  . BACK SURGERY    . CHOLECYSTECTOMY  10  . HERNIA REPAIR  93   umbilical, groin as  child  . REVERSE SHOULDER ARTHROPLASTY Left 12/13/2014   Procedure: LEFT REVERSE TOTAL SHOULDER ARTHROPLASTY;  Surgeon: Beverely Low, MD;  [facility]: [facility];  Service: Orthopedics;  Laterality: Left;  . rotator cuff surgery Left 14        Home Medications    Prior to Admission medications   Medication Sig Start Date End Date Taking? Authorizing Provider  albuterol (PROVENTIL HFA;VENTOLIN HFA) 108 (90 Base) MCG/ACT inhaler Inhale 1-2 puffs into the lungs every 6 (six) hours as needed for wheezing or shortness of breath.     [provider]  allopurinol (ZYLOPRIM) 100 MG tablet Take 1 tablet (100 mg total) by mouth daily. 05/14/18   Money, Gerlene Burdock, FNP  Cholecalciferol (VITAMIN D3) 5000 units CAPS Take 5,000 Units by mouth daily.    [provider]  cyclobenzaprine (FLEXERIL) 5 MG tablet Take 1 tablet (5 mg total) by mouth 3 (three) times daily as needed. 06/11/18   Devoria Albe, MD  dexamethasone (DECADRON) 4 MG tablet Take 1 tablet (4 mg total) by mouth 2 (two) times daily with a meal. 05/15/18   Ivery Quale, PA-C  doxycycline (VIBRAMYCIN) 100 MG capsule Take 1 capsule (100 mg total) by mouth 2 (two) times daily. 05/15/18  Ivery Quale, PA-C  FLUoxetine (PROZAC) 10 MG capsule Take 1 capsule (10 mg total) by mouth daily. For mood control 05/14/18   Money, Gerlene Burdock, FNP  gabapentin (NEURONTIN) 300 MG capsule Take 300 mg by mouth 3 (three) times daily.    [provider]  hydrOXYzine (ATARAX/VISTARIL) 25 MG tablet Take 1 tablet (25 mg total) by mouth 3 (three) times daily as needed for anxiety. 05/13/18   Money, Gerlene Burdock, FNP  levothyroxine (SYNTHROID, LEVOTHROID) 75 MCG tablet Take 75 mcg by mouth daily. 04/19/18   [provider]  naproxen (NAPROSYN) 250 MG tablet Take 1 po BID with food prn pain 06/11/18   Devoria Albe, MD  oxyCODONE-acetaminophen (PERCOCET/ROXICET) 5-325 MG tablet Take 1 tablet by mouth 3 (three) times daily.  11/04/16   [provider]  pramipexole (MIRAPEX) 0.5 MG tablet Take 0.5 mg by mouth every evening.    [provider]  sulfamethoxazole-trimethoprim (BACTRIM DS,SEPTRA DS) 800-160 MG tablet Take 1 tablet by mouth daily. 04/19/18   [provider]  tamsulosin (FLOMAX) 0.4 MG CAPS capsule Take 0.4 mg by mouth daily.    [provider]  traZODone (DESYREL) 50 MG tablet Take 1 tablet (50 mg total) by mouth at bedtime as needed for sleep. 05/13/18   Money, Gerlene Burdock, FNP    Family History Family History  Problem Relation Age of Onset  . Cancer Mother   . Cancer Father   . Heart disease Father   . Arrhythmia Brother        Needs pacer  . Stroke Brother     Social History Social History   Tobacco Use  . Smoking status: Never Smoker  . Smokeless tobacco: Never Used  Substance Use Topics  . Alcohol use: Never    Frequency: Never  . Drug use: Never  lives at home On disability for DDD of his back and gout   Allergies   Cephalexin; Penicillins; and Vicodin [hydrocodone-acetaminophen]   Review of Systems Review of Systems  All other systems reviewed and are negative.    Physical Exam Updated Vital Signs BP (!) 163/101 (BP Location: Right Arm)   Pulse 74   Temp 97.8 F (36.6 C) (Oral)   Resp 17   SpO2 100%   Physical Exam  Constitutional: He is oriented to person, place, and time. He appears well-developed and well-nourished. No distress.  HENT:  Head: Normocephalic and atraumatic.  Right Ear: External ear normal.  Left Ear: External ear normal.  Nose: Nose normal.  Eyes: Conjunctivae and EOM are normal.  Neck: Normal range of motion.  Cardiovascular: Normal rate.  Pulmonary/Chest: Effort normal. No respiratory distress.  Musculoskeletal: Normal range of motion.       Back:  Patient has pain along the medial aspect of his left scapula.  He does not appear to be tender in the midline thoracic or lumbar spine.  He is tender when I rotate his left arm in  that same area.  He has a deep well-healed surgical scar from his lower thoracic spine down to his sacral area.  It is without redness or swelling.  Does not appear to be infected.  His patellar reflexes are 0 bilaterally.  Neurological: He is alert and oriented to person, place, and time. No cranial nerve deficit.  Skin: Skin is warm and dry. No rash noted. No erythema.  Psychiatric: He has a normal mood and affect. His behavior is normal. Thought content normal.  Nursing note and vitals reviewed.  ED Treatments / Results  Labs (all labs ordered are listed, but only abnormal results are displayed) Labs Reviewed - No data to display  EKG None  Radiology No results found.  Procedures Procedures (including critical care time)  Medications Ordered in ED Medications  ketorolac (TORADOL) injection 60 mg (60 mg Intramuscular Given 06/11/18 0521)  dexamethasone (DECADRON) injection 10 mg (10 mg Intramuscular Given 06/11/18 0520)  cyclobenzaprine (FLEXERIL) tablet 5 mg (5 mg Oral Given 06/11/18 0520)     Initial Impression / Assessment and Plan / ED Course  I have reviewed the triage vital signs and the nursing notes.  Pertinent labs & imaging results that were available during my care of the patient were reviewed by me and considered in my medical decision making (see chart for details).   I reviewed patient's prior labs from October 28 and his BUN and creatinine were 13/1.01 with a CBG of 79.  He was given Toradol, Decadron, and oral Flexeril.   Recheck at 6:10 AM patient states he is feeling better.  He was discharged home.  Review of the [place] database shows patient has been getting #120 oxycodone 5/325 monthly since December 22, 2016, however on Nov 23, 2017 his primary care doctor dropping down to 90 tablets a month.  Final Clinical Impressions(s) / ED Diagnoses   Final diagnoses:  Pain of left scapula    ED Discharge Orders         Ordered    cyclobenzaprine  (FLEXERIL) 5 MG tablet  3 times daily PRN     06/11/18 0613    naproxen (NAPROSYN) 250 MG tablet     06/11/18 [id number]         Plan discharge  Devoria AlbeIva Shaneil Yazdi, MD, Concha PyoFACEP    Macedonio Scallon, MD 06/11/18 [phone]

## 2018-06-11 NOTE — ED Notes (Signed)
Pt ambulatory to waiting room. Pt verbalized understanding of discharge instructions.   

## 2018-06-11 NOTE — Discharge Instructions (Signed)
Use ice and heat for comfort.  Take the medications as prescribed.  Have your girlfriend check your back daily to make sure you do not develop shingles.  It will start out as small red blistered lesions.  If you develop those lesions let your doctor know.  Also let your doctor know if your pain is not improving over the next several days.  You could also use a over-the-counter lidocaine patch to the area if needed.

## 2018-08-25 ENCOUNTER — Ambulatory Visit (HOSPITAL_COMMUNITY): Payer: Medicare Other | Attending: Neurology

## 2018-08-25 ENCOUNTER — Encounter (HOSPITAL_COMMUNITY): Payer: Self-pay

## 2018-08-25 ENCOUNTER — Other Ambulatory Visit: Payer: Self-pay

## 2018-08-25 DIAGNOSIS — R29898 Other symptoms and signs involving the musculoskeletal system: Secondary | ICD-10-CM | POA: Diagnosis present

## 2018-08-25 DIAGNOSIS — M5442 Lumbago with sciatica, left side: Secondary | ICD-10-CM | POA: Diagnosis present

## 2018-08-25 DIAGNOSIS — M6281 Muscle weakness (generalized): Secondary | ICD-10-CM | POA: Insufficient documentation

## 2018-08-25 DIAGNOSIS — G8929 Other chronic pain: Secondary | ICD-10-CM | POA: Diagnosis present

## 2018-08-25 DIAGNOSIS — R293 Abnormal posture: Secondary | ICD-10-CM

## 2018-08-25 DIAGNOSIS — R29818 Other symptoms and signs involving the nervous system: Secondary | ICD-10-CM

## 2018-08-25 DIAGNOSIS — M5441 Lumbago with sciatica, right side: Secondary | ICD-10-CM | POA: Diagnosis present

## 2018-08-25 NOTE — Therapy (Signed)
[facility] [address] [place], [place], [address] Phone: [phone]   Fax:  [phone]  Physical Therapy Evaluation  Patient Details  Name: Jared Beck MRN: [id number] Date of Birth: [birth year] Referring Provider (PT): Beckie Salts, MD   Encounter Date: 08/25/2018  PT End of Session - 08/25/18 1248    Visit Number  1    Number of Visits  9    Date for PT Re-Evaluation  09/22/18    Authorization Type  [employer] Medicare (Secondary: Medicaid)    Authorization Time Period  08/25/18 to 09/22/18    Authorization - Visit Number  1    Authorization - Number of Visits  10    PT Start Time  1032    PT Stop Time  1116    PT Time Calculation (min)  44 min    Activity Tolerance  Patient tolerated treatment well    Behavior During Therapy  [facility] for tasks assessed/performed       Past Medical History:  Diagnosis Date  . Anxiety   . Arthritis   . Arthritis   . Complication of anesthesia   . DDD (degenerative disc disease), lumbar   . Depression   . GERD (gastroesophageal reflux disease)    occ  . Gout   . Gout   . PONV (postoperative nausea and vomiting)    nausea    Past Surgical History:  Procedure Laterality Date  . APPENDECTOMY  93  . BACK SURGERY    . CHOLECYSTECTOMY  10  . HERNIA REPAIR  93   umbilical, groin as child  . REVERSE SHOULDER ARTHROPLASTY Left 12/13/2014   Procedure: LEFT REVERSE TOTAL SHOULDER ARTHROPLASTY;  Surgeon: Beverely Low, MD;  Location: [facility] OR;  Service: Orthopedics;  Laterality: Left;  . rotator cuff surgery Left 14    There were no vitals filed for this visit.   Subjective Assessment - 08/25/18 1036    Subjective  Pt reports worsening LBP over the last 4 months. He reports having T10-pelvis fusion in April 2018. He had DDD, scoliosis, with bil radicular symptoms which is what led to the fusion in 2018. He states his current pain has just been a gradual onset. It is across his entire lower back, with  symptoms down BLE, L>R. He reports sharp/shooting pain down BLE, as well as n/t down to bil feet. He has had a CT myelogram and an EMG test recently; depending on those results will determine if he needs to have another surgery. He reports haivng the most difficulty with walking, standing, and sitting for long periods of time due to his pain; he reports standing and walking increased his BLE pain. He does feel a little off balance.     Limitations  Walking;Standing;House hold activities;Sitting    How long can you sit comfortably?     How long can you stand comfortably?  1 hour    How long can you walk comfortably?  about 15 mins    Patient Stated Goals  that this will help it    Currently in Pain?  Yes    Pain Score  7     Pain Location  Back    Pain Orientation  Lower    Pain Descriptors / Indicators  Aching;Dull    Pain Type  Chronic pain    Pain Radiating Towards  down to bil ankles    Pain Onset  More than a month ago    Pain Frequency  Constant    Aggravating Factors   walking, standing, sitting, turning in bed    Pain Relieving Factors  unsure    Effect of Pain on Daily Activities  increases         [facility] PT Assessment - 08/25/18 0001      Assessment   Medical Diagnosis  Lumbar radiculopathy,s/p spinal fusion    Referring Provider (PT)  Beckie Salts, MD    Onset Date/Surgical Date  11/08/16    Next MD Visit  09/06/18    Prior Therapy  [facility]PT following surgery in 2018      Balance Screen   Has the patient fallen in the past 6 months  No    Has the patient had a decrease in activity level because of a fear of falling?   No    Is the patient reluctant to leave their home because of a fear of falling?   No      Prior Function   Level of Independence  Independent   just min A for socks   Vocation  On disability    Leisure  go to the [facility]      Observation/Other Assessments   Focus on Therapeutic Outcomes (FOTO)   to be completed next visit      Sensation   Light  Touch  Impaired Detail    Light Touch Impaired Details  Impaired LLE    Additional Comments  L2-4 and S1 dulled on L, absent with L5 on L      Functional Tests   Functional tests  Sit to Stand      Sit to Stand   Comments  30sec chair rise test: 7x      Posture/Postural Control   Posture/Postural Control  Postural limitations      ROM / Strength   AROM / PROM / Strength  Strength;AROM      AROM   Overall AROM Comments  grossly limited throughout all lumbar ROM due to lumbar fusion      Strength   Strength Assessment Site  Hip;Knee;Ankle    Right Hip Flexion  4+/5    Right Hip Extension  4/5    Right Hip ABduction  4-/5    Left Hip Flexion  4/5    Left Hip Extension  3+/5    Left Hip ABduction  4-/5    Right Knee Flexion  5/5    Right Knee Extension  4+/5    Left Knee Flexion  4+/5    Left Knee Extension  4/5    Right Ankle Dorsiflexion  5/5    Right Ankle Plantar Flexion  4/5   7 reps   Left Ankle Dorsiflexion  4/5    Left Ankle Plantar Flexion  4-/5   1.5 reps     Flexibility   Soft Tissue Assessment /Muscle Length  yes    Hamstrings  90/90: R: 129deg, L: 126deg      Special Tests    Special Tests  Lumbar    Lumbar Tests  Slump Test;Straight Leg Raise      Slump test   Findings  Positive    Comment  +BLE      Straight Leg Raise   Findings  Positive    Comment  +BLE for LBP and LE pain      Ambulation/Gait   Ambulation Distance (Feet)  476 Feet     Assistive device  None    Gait Pattern  Step-through pattern;Trendelenburg;Antalgic  Balance   Balance Assessed  Yes      Static Standing Balance   Static Standing - Balance Support  No upper extremity supported    Static Standing Balance -  Activities   Single Leg Stance - Right Leg;Single Leg Stance - Left Leg    Static Standing - Comment/# of Minutes  R: 3sec or < L: 2.15 sec or <      Standardized Balance Assessment   Standardized Balance Assessment  --            Objective  measurements completed on examination: See above findings.        PT Education - 08/25/18 1248    Education Details  exam findings, HEP, POC    Person(s) Educated  Patient    Methods  Explanation;Demonstration;Handout    Comprehension  Verbalized understanding       PT Short Term Goals - 08/25/18 1250      PT SHORT TERM GOAL #1   Title  Pt will have 1/2 grade improvement throughout MMT in order to reduce pain and improve function.     Time  2    Period  Weeks    Status  New    Target Date  09/08/18      PT SHORT TERM GOAL #2   Title  Pt will be able to perform bil SLS for 10sec in order to demo improved functional hip strength and balance so as to improve his gait and functional mobility.     Time  2    Period  Weeks    Status  New      PT SHORT TERM GOAL #3   Title  Pt will have improved 30sec chair rise test to 12x in order to demo improved balance and functional hip strength.     Time  2    Period  Weeks    Status  New        PT Long Term Goals - 08/25/18 1251      PT LONG TERM GOAL #1   Title  Pt will have improved bil HS length from 90/90 position to 135deg or > in order to demo improved flexibility and reduce his LBP.     Time  4    Period  Weeks    Status  New    Target Date  09/22/18      PT LONG TERM GOAL #2   Title  Pt will report that his radicular symptoms don't go past the knee or better in order to demo reduce neural tension and promote reduced overall pain.     Time  4    Period  Weeks    Status  New      PT LONG TERM GOAL #3   Title  Pt will report being able to sleep for 2 hours straight before being awakened by LBP in order to maximize his recovery and reduce pain.     Time  4    Period  Weeks    Status  New      PT LONG TERM GOAL #4   Title  Pt will report improved tolerance to sitting, standing, and walking by 50% or > in order to maximize his ability to perform household tasks and access the community with greater ease.     Time  4     Period  Weeks    Status  New  Plan - 08/25/18 1248    Clinical Impression Statement  Pt is pleasant 57YO M who presents to OPPT with c/o LBP with radicular pain down BLE. Pt has h/o T10-pelvis fusion in April 2018 due to scoliosis and BLE radicular pain. Pt has been doing relatively well over the last almost 2 years, without any radicular pain, but has had an increased in lower back with increase in BLE radicular symptoms, LLE>RLE. Pt +for SLR and slump testing BLE indicating neural involvement. His light touch was dulled throughout LLE, with L5 being absent and his PF on the LLE was significantly weaker than the RLE, indicating L5 myotome and dermatome deficits. He also presents with deficits in BLE strength, functional strength, balance, gait, HS flexibility, and overall functional mobility. Pt needs skilled PT intervention in order to reduce pain and maximize his functional mobility.    Clinical Presentation  Stable    Clinical Presentation due to:  see flowsheets for objective tests and measures    Clinical Decision Making  Moderate    Rehab Potential  Fair    PT Frequency  2x / week    PT Duration  4 weeks    PT Treatment/Interventions  ADLs/Self Care Home Management;Aquatic Therapy;Canalith Repostioning;Cryotherapy;Electrical Stimulation;Moist Heat;Ultrasound;DME Instruction;Gait training;Stair training;Functional mobility training;Therapeutic activities;Therapeutic exercise;Balance training;Neuromuscular re-education;Patient/family education;Orthotic Fit/Training;Manual techniques;Scar mobilization;Passive range of motion;Dry needling;Taping    PT Next Visit Plan  review goals, adminster FOTO; begin BLE stretching, strengthening, functional strengthening, and balance activities as tolerated    PT Home Exercise Plan  eval: LTRs, bridge    Consulted and Agree with Plan of Care  Patient       Patient will benefit from skilled therapeutic intervention in order to improve  the following deficits and impairments:  Abnormal gait, Decreased activity tolerance, Decreased balance, Decreased endurance, Decreased mobility, Decreased range of motion, Decreased scar mobility, Decreased strength, Difficulty walking, Hypomobility, Increased fascial restricitons, Increased muscle spasms, Impaired flexibility, Impaired sensation, Improper body mechanics, Postural dysfunction, Pain  Visit Diagnosis: Chronic bilateral low back pain with bilateral sciatica - Plan: PT plan of care cert/re-cert  Abnormal posture - Plan: PT plan of care cert/re-cert  Muscle weakness (generalized) - Plan: PT plan of care cert/re-cert  Other symptoms and signs involving the musculoskeletal system - Plan: PT plan of care cert/re-cert  Other symptoms and signs involving the nervous system - Plan: PT plan of care cert/re-cert     Problem List Patient Active Problem List   Diagnosis Date Noted  . Major depression, recurrent (HCC) 05/09/2018  . 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 Complete rupture of rotator cuff 04/27/2013  . Pain in joint, shoulder region 04/27/2013  . Muscle weakness (generalized) 04/27/2013         Jac CanavanBrooke  PT, DPT   [facility] [address] , [place], [id number] Phone: [phone]   Fax:  [phone]  Name: Jared Beck MRN: [id number] Date of Birth: [birth year]

## 2018-08-29 ENCOUNTER — Encounter (HOSPITAL_COMMUNITY): Payer: Self-pay

## 2018-08-29 ENCOUNTER — Ambulatory Visit (HOSPITAL_COMMUNITY): Payer: Medicare Other

## 2018-08-29 DIAGNOSIS — M6281 Muscle weakness (generalized): Secondary | ICD-10-CM

## 2018-08-29 DIAGNOSIS — R29898 Other symptoms and signs involving the musculoskeletal system: Secondary | ICD-10-CM

## 2018-08-29 DIAGNOSIS — G8929 Other chronic pain: Secondary | ICD-10-CM

## 2018-08-29 DIAGNOSIS — R29818 Other symptoms and signs involving the nervous system: Secondary | ICD-10-CM

## 2018-08-29 DIAGNOSIS — R293 Abnormal posture: Secondary | ICD-10-CM

## 2018-08-29 DIAGNOSIS — M5442 Lumbago with sciatica, left side: Principal | ICD-10-CM

## 2018-08-29 DIAGNOSIS — M5441 Lumbago with sciatica, right side: Principal | ICD-10-CM

## 2018-08-29 NOTE — Therapy (Signed)
[facility] [address] , [place], [address] Phone: [phone]   Fax:  [phone]  Physical Therapy Treatment  Patient Details  Name: Jared Beck MRN: [id number] Date of Birth: [birth year] Referring Provider (PT): Beckie SaltsMark Hnilica, MD   Encounter Date: 08/29/2018  PT End of Session - 08/29/18 0944    Visit Number  2    Number of Visits  9    Date for PT Re-Evaluation  09/22/18    Authorization Type  [employer] Medicare (Secondary: Medicaid)    Authorization Time Period  08/25/18 to 09/22/18    Authorization - Visit Number  2    Authorization - Number of Visits  10    PT Start Time  0945    PT Stop Time  1026    PT Time Calculation (min)  41 min    Activity Tolerance  Patient tolerated treatment well    Behavior During Therapy  [facility] for tasks assessed/performed       Past Medical History:  Diagnosis Date  . Anxiety   . Arthritis   . Arthritis   . Complication of anesthesia   . DDD (degenerative disc disease), lumbar   . Depression   . GERD (gastroesophageal reflux disease)    occ  . Gout   . Gout   . PONV (postoperative nausea and vomiting)    nausea    Past Surgical History:  Procedure Laterality Date  . APPENDECTOMY  93  . BACK SURGERY    . CHOLECYSTECTOMY  10  . HERNIA REPAIR  93   umbilical, groin as child  . REVERSE SHOULDER ARTHROPLASTY Left 12/13/2014   Procedure: LEFT REVERSE TOTAL SHOULDER ARTHROPLASTY;  Surgeon: Beverely LowSteve Norris, MD;  [facility];  Service: Orthopedics;  Laterality: Left;  . rotator cuff surgery Left 14    There were no vitals filed for this visit.  Subjective Assessment - 08/29/18 0945    Subjective  Pt states that he is not feeling too good. His back is bothering him a lot, with LLE pain worse than the R. He tried his HEP and he stated that it increased his pain.     Limitations  Walking;Standing;House hold activities;Sitting    How long can you sit comfortably?  30mins    How long can  you stand comfortably?  1 hour    How long can you walk comfortably?  about 15 mins    Patient Stated Goals  that this will help it    Currently in Pain?  Yes    Pain Score  6     Pain Location  Back    Pain Orientation  Lower    Pain Descriptors / Indicators  Sharp    Pain Type  Chronic pain    Pain Radiating Towards  down to bil ankles    Pain Onset  More than a month ago    Pain Frequency  Constant    Aggravating Factors   walking, standing, sitting, turning in bed    Pain Relieving Factors  unsure    Effect of Pain on Daily Activities  increases         [facility] PT Assessment - 08/29/18 0001      Observation/Other Assessments   Focus on Therapeutic Outcomes (FOTO)   63% limited          [facility] Adult PT Treatment/Exercise - 08/29/18 0001      Exercises   Exercises  Lumbar  Lumbar Exercises: Stretches   Active Hamstring Stretch  Right;Left    Active Hamstring Stretch Limitations  10x10" holds, 90/90 position    Figure 4 Stretch  3 reps;30 seconds;Supine;With overpressure    Figure 4 Stretch Limitations  BLE, knee towards opposite shoulder    Other Lumbar Stretch Exercise  fwd, R/L seated lumbar flexion stretch 5x10" each with large physioball       Manual Therapy   Manual Therapy  Soft tissue mobilization    Manual therapy comments  separate rest of treatment    Soft tissue mobilization  IASTM green ball to bil lumbar paraspinals and glutes to reduce restrictions and pain            PT Education - 08/29/18 0945    Education Details  reviewed goals, exercise technique, updated HEP    Person(s) Educated  Patient    Methods  Explanation;Demonstration;Handout    Comprehension  Verbalized understanding;Returned demonstration       PT Short Term Goals - 08/25/18 1250      PT SHORT TERM GOAL #1   Title  Pt will have 1/2 grade improvement throughout MMT in order to reduce pain and improve function.     Time  2    Period  Weeks    Status  New    Target Date   09/08/18      PT SHORT TERM GOAL #2   Title  Pt will be able to perform bil SLS for 10sec in order to demo improved functional hip strength and balance so as to improve his gait and functional mobility.     Time  2    Period  Weeks    Status  New      PT SHORT TERM GOAL #3   Title  Pt will have improved 30sec chair rise test to 12x in order to demo improved balance and functional hip strength.     Time  2    Period  Weeks    Status  New        PT Long Term Goals - 08/25/18 1251      PT LONG TERM GOAL #1   Title  Pt will have improved bil HS length from 90/90 position to 135deg or > in order to demo improved flexibility and reduce his LBP.     Time  4    Period  Weeks    Status  New    Target Date  09/22/18      PT LONG TERM GOAL #2   Title  Pt will report that his radicular symptoms don't go past the knee or better in order to demo reduce neural tension and promote reduced overall pain.     Time  4    Period  Weeks    Status  New      PT LONG TERM GOAL #3   Title  Pt will report being able to sleep for 2 hours straight before being awakened by LBP in order to maximize his recovery and reduce pain.     Time  4    Period  Weeks    Status  New      PT LONG TERM GOAL #4   Title  Pt will report improved tolerance to sitting, standing, and walking by 50% or > in order to maximize his ability to perform household tasks and access the community with greater ease.     Time  4    Period  Weeks  Status  New            Plan - 08/29/18 1028    Clinical Impression Statement  Began session by administering FOTO and reviewing goals; pt scored 64% limited on FOTO and had no f/u questions regarding his goals. Pt did report that his HEP (LTRs and bridging) increased his pain so PT educated pt to not perform them anymore since he stated that it was pain, not mm soreness. Session focused on overall flexibility and pain reduction. Pt generally stiffer with LLE. Ended with manual  IASTM to bil lumbar paraspinals and glutes and pt reporting reduced pain to 3/10 at EOS (was 6/10). Updated HEP to include supine hamstring stretch and hooklying glute sets since the bridging and LTRs were provocative. Continue as planned, progressing as able.     Rehab Potential  Fair    PT Frequency  2x / week    PT Duration  4 weeks    PT Treatment/Interventions  ADLs/Self Care Home Management;Aquatic Therapy;Canalith Repostioning;Cryotherapy;Electrical Stimulation;Moist Heat;Ultrasound;DME Instruction;Gait training;Stair training;Functional mobility training;Therapeutic activities;Therapeutic exercise;Balance training;Neuromuscular re-education;Patient/family education;Orthotic Fit/Training;Manual techniques;Scar mobilization;Passive range of motion;Dry needling;Taping    PT Next Visit Plan  continue BLE stretching, low-level strengthening, functional strengthening, and balance activities as tolerated    PT Home Exercise Plan  2/11: supine HS stretch, hooklying glute sets    Consulted and Agree with Plan of Care  Patient       Patient will benefit from skilled therapeutic intervention in order to improve the following deficits and impairments:  Abnormal gait, Decreased activity tolerance, Decreased balance, Decreased endurance, Decreased mobility, Decreased range of motion, Decreased scar mobility, Decreased strength, Difficulty walking, Hypomobility, Increased fascial restricitons, Increased muscle spasms, Impaired flexibility, Impaired sensation, Improper body mechanics, Postural dysfunction, Pain  Visit Diagnosis: Chronic bilateral low back pain with bilateral sciatica  Abnormal posture  Muscle weakness (generalized)  Other symptoms and signs involving the musculoskeletal system  Other symptoms and signs involving the nervous system     Problem List Patient Active Problem List   Diagnosis Date Noted  . Major depression, recurrent (HCC) 05/09/2018  . S/P shoulder replacement  12/13/2014  . Complete rupture of rotator cuff 04/27/2013  . Pain in joint, shoulder region 04/27/2013  . Muscle weakness (generalized) 04/27/2013       Jac Canavan PT, DPT  [facility] [address] [place], [place], [address] Phone: [phone]   Fax:  [phone]  Name: Jared Beck MRN: [id number] Date of Birth: 1960-10-07

## 2018-08-30 ENCOUNTER — Telehealth (HOSPITAL_COMMUNITY): Payer: Self-pay

## 2018-08-30 NOTE — Telephone Encounter (Signed)
L/m to cx this apptment and ask pt to call back to R/s. NF 08/30/18

## 2018-08-31 ENCOUNTER — Ambulatory Visit (HOSPITAL_COMMUNITY): Payer: Medicare Other

## 2018-09-04 ENCOUNTER — Telehealth (HOSPITAL_COMMUNITY): Payer: Self-pay

## 2018-09-04 NOTE — Telephone Encounter (Signed)
He has to go see his sick brother. Will be here Thrusday

## 2018-09-05 ENCOUNTER — Ambulatory Visit (HOSPITAL_COMMUNITY): Payer: Medicare Other

## 2018-09-07 ENCOUNTER — Ambulatory Visit (HOSPITAL_COMMUNITY): Payer: Medicare Other

## 2018-09-07 ENCOUNTER — Encounter (HOSPITAL_COMMUNITY): Payer: Self-pay

## 2018-09-07 NOTE — Therapy (Signed)
[place], [place], [address] Phone: [phone]   Fax:  [phone]  Patient Details  Name: YONATAN GUITRON MRN: [id number] Date of Birth: 03-25-1961 Referring Provider:  No ref. provider found  Encounter Date: 09/07/2018  Pt called on 09/06/18 to cancel all remaining appointments per MD request because he is getting a stimulator put in.  PHYSICAL THERAPY DISCHARGE SUMMARY  Visits from Start of Care: 2  Current functional level related to goals / functional outcomes: See last note   Remaining deficits: See last note   Education / Equipment: n/a  Plan: Patient agrees to discharge.  Patient goals were not met. Patient is being discharged due to the patient's request.  ?????     Geraldine Solar PT, [place] [address] [place], [place], [address] Phone: [phone]   Fax:  [phone]

## 2018-09-12 ENCOUNTER — Ambulatory Visit (HOSPITAL_COMMUNITY): Payer: Medicare Other

## 2018-09-14 ENCOUNTER — Encounter (HOSPITAL_COMMUNITY): Payer: Self-pay

## 2018-09-19 ENCOUNTER — Encounter (HOSPITAL_COMMUNITY): Payer: Self-pay

## 2018-09-19 ENCOUNTER — Ambulatory Visit (HOSPITAL_COMMUNITY): Payer: Medicare Other

## 2018-09-21 ENCOUNTER — Encounter (HOSPITAL_COMMUNITY): Payer: Self-pay

## 2020-03-20 ENCOUNTER — Ambulatory Visit (INDEPENDENT_AMBULATORY_CARE_PROVIDER_SITE_OTHER): Payer: Medicare Other

## 2020-03-20 ENCOUNTER — Other Ambulatory Visit: Payer: Self-pay

## 2020-03-20 ENCOUNTER — Ambulatory Visit (INDEPENDENT_AMBULATORY_CARE_PROVIDER_SITE_OTHER): Payer: Medicare Other | Admitting: Podiatry

## 2020-03-20 ENCOUNTER — Encounter: Payer: Self-pay | Admitting: Podiatry

## 2020-03-20 DIAGNOSIS — M2042 Other hammer toe(s) (acquired), left foot: Secondary | ICD-10-CM

## 2020-03-20 DIAGNOSIS — M2041 Other hammer toe(s) (acquired), right foot: Secondary | ICD-10-CM

## 2020-03-20 DIAGNOSIS — B351 Tinea unguium: Secondary | ICD-10-CM

## 2020-03-20 DIAGNOSIS — M778 Other enthesopathies, not elsewhere classified: Secondary | ICD-10-CM | POA: Diagnosis not present

## 2020-03-20 DIAGNOSIS — Q828 Other specified congenital malformations of skin: Secondary | ICD-10-CM | POA: Diagnosis not present

## 2020-03-20 DIAGNOSIS — I739 Peripheral vascular disease, unspecified: Secondary | ICD-10-CM

## 2020-03-20 DIAGNOSIS — M79676 Pain in unspecified toe(s): Secondary | ICD-10-CM

## 2020-03-20 DIAGNOSIS — R0989 Other specified symptoms and signs involving the circulatory and respiratory systems: Secondary | ICD-10-CM

## 2020-03-20 NOTE — Progress Notes (Signed)
Subjective:  Patient ID: Jared Beck, male    DOB: 01-01-1961,  MRN: [id number] HPI Chief Complaint  Patient presents with  . Foot Pain    Dorsal/medial midfoot right- aching x years intermittently, getting more pain more frequently, swells, concerned about crooked toes on both feet  . New Patient (Initial Visit)    59 y.o. male presents with the above complaint.   ROS: Denies fever chills nausea vomiting muscle aches pains calf pain back pain chest pain shortness of breath.  States that his calves bother him when he walks long distance.  Past Medical History:  Diagnosis Date  . Anxiety   . Arthritis   . Arthritis   . Complication of anesthesia   . DDD (degenerative disc disease), lumbar   . Depression   . GERD (gastroesophageal reflux disease)    occ  . Gout   . Gout   . PONV (postoperative nausea and vomiting)    nausea   Past Surgical History:  Procedure Laterality Date  . APPENDECTOMY  93  . BACK SURGERY    . CHOLECYSTECTOMY  10  . HERNIA REPAIR  93   umbilical, groin as child  . REVERSE SHOULDER ARTHROPLASTY Left 12/13/2014   Procedure: LEFT REVERSE TOTAL SHOULDER ARTHROPLASTY;  Surgeon: Netta Cedars, MD;  Location: [facility];  Service: Orthopedics;  Laterality: Left;  . rotator cuff surgery Left 14    Current Outpatient Medications:  .  albuterol (PROVENTIL HFA;VENTOLIN HFA) 108 (90 Base) MCG/ACT inhaler, Inhale 1-2 puffs into the lungs every 6 (six) hours as needed for wheezing or shortness of breath. , Disp: , Rfl:  .  allopurinol (ZYLOPRIM) 100 MG tablet, Take 1 tablet (100 mg total) by mouth daily., Disp: 30 tablet, Rfl: 0 .  Cholecalciferol (VITAMIN D3) 5000 units CAPS, Take 5,000 Units by mouth daily., Disp: , Rfl:  .  FLUoxetine (PROZAC) 10 MG capsule, Take 1 capsule (10 mg total) by mouth daily. For mood control, Disp: 30 capsule, Rfl: 0 .  gabapentin (NEURONTIN) 300 MG capsule, Take 300 mg by mouth 3 (three) times daily., Disp: , Rfl:  .  levothyroxine  (SYNTHROID, LEVOTHROID) 75 MCG tablet, Take 75 mcg by mouth daily., Disp: , Rfl:  .  naproxen (NAPROSYN) 250 MG tablet, Take 1 po BID with food prn pain, Disp: 20 tablet, Rfl: 0 .  oxyCODONE-acetaminophen (PERCOCET/ROXICET) 5-325 MG tablet, Take 1 tablet by mouth 3 (three) times daily. , Disp: , Rfl:  .  pramipexole (MIRAPEX) 0.5 MG tablet, Take 0.5 mg by mouth every evening., Disp: , Rfl:  .  traZODone (DESYREL) 50 MG tablet, Take 1 tablet (50 mg total) by mouth at bedtime as needed for sleep., Disp: 30 tablet, Rfl: 0  Allergies  Allergen Reactions  . Cephalexin Itching and Swelling  . Penicillins Swelling    Has patient had a PCN reaction causing immediate rash, facial/tongue/throat swelling, SOB or lightheadedness with hypotension: Yes Has patient had a PCN reaction causing severe rash involving mucus membranes or skin necrosis: Yes Has patient had a PCN reaction that required hospitalization Yes Has patient had a PCN reaction occurring within the last 10 years: No If all of the above answers are "NO", then may proceed with Cephalosporin use.   Swelling of tongue  . Vicodin [Hydrocodone-Acetaminophen] Itching   Review of Systems Objective:  There were no vitals filed for this visit.  General: Well developed, nourished, in no acute distress, alert and oriented x3   Dermatological: Skin is warm, dry  and supple bilateral. Nails x 10 are poorly maintained they are long thick yellow dystrophic and clinically mycotic; remaining integument appears unremarkable at this time. There are no open sores, no preulcerative lesions, no rash or signs of infection present.  Reactive hyperkeratotic lesion subfifth metatarsal phalangeal joint of the right foot.  Vascular: Dorsalis Pedis artery and Posterior Tibial artery pedal pulses are 2/4 right.  Left foot demonstrates 1/4 and delayed capillary fill time.  Nonpulsatile DP left.  With immedate capillary fill time. Pedal hair growth present. No  varicosities and no lower extremity edema present bilateral.   Neruologic: Grossly intact via light touch bilateral. Vibratory intact via tuning fork bilateral. Protective threshold with Semmes Wienstein monofilament intact to all pedal sites bilateral. Patellar and Achilles deep tendon reflexes 2+ bilateral. No Babinski or clonus noted bilateral.   Musculoskeletal: No gross boney pedal deformities bilateral. No pain, crepitus, or limitation noted with foot and ankle range of motion bilateral. Muscular strength 5/5 in all groups tested bilateral.  Severe digital deformities bilateral with severe osteoarthritic changes very irregular shaped foot with rigid digits.  Gait: Unassisted, Nonantalgic.    Radiographs:  Radiographs demonstrate severe osteoarthritic changes of the midfoot rear foot and distal foot.  Digital changes rigid in nature with considerable spurring and hypertrophic bone growth.  Assessment & Plan:   Assessment: Osteoarthritis.  Pain in limb secondary to onychomycosis and reactive hyperkeratosis.  Plan: Debridement of toenails 1 through 5 bilateral debridement of reactive hyperkeratotic tissue subfifth met right.  We will refer to vascular for left nonpalpable DP and sluggish capillary fill time with calf pain.     Stanisha Lorenz T. [place], [place]

## 2020-04-07 ENCOUNTER — Ambulatory Visit (HOSPITAL_COMMUNITY)
Admission: RE | Admit: 2020-04-07 | Discharge: 2020-04-07 | Disposition: A | Discharge status: Home / Self Care | Payer: Medicare Other | Source: Ambulatory Visit | Attending: Vascular Surgery | Admitting: Vascular Surgery

## 2020-04-07 ENCOUNTER — Other Ambulatory Visit: Payer: Self-pay

## 2020-04-07 DIAGNOSIS — I739 Peripheral vascular disease, unspecified: Secondary | ICD-10-CM

## 2020-04-14 ENCOUNTER — Encounter: Payer: Self-pay | Admitting: Vascular Surgery

## 2020-04-14 ENCOUNTER — Other Ambulatory Visit: Payer: Self-pay

## 2020-04-14 ENCOUNTER — Ambulatory Visit (INDEPENDENT_AMBULATORY_CARE_PROVIDER_SITE_OTHER): Payer: Medicare Other | Admitting: Vascular Surgery

## 2020-04-14 VITALS — BP 118/76 | HR 64 | Temp 97.6°F | Resp 16 | Ht 67.0 in | Wt 162.0 lb

## 2020-04-14 DIAGNOSIS — I739 Peripheral vascular disease, unspecified: Secondary | ICD-10-CM | POA: Diagnosis not present
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
## 2020-06-30 ENCOUNTER — Ambulatory Visit: Payer: Medicare Other | Admitting: Podiatry

## 2020-09-11 DIAGNOSIS — R5382 Chronic fatigue, unspecified: Secondary | ICD-10-CM | POA: Diagnosis not present

## 2020-09-11 DIAGNOSIS — G8929 Other chronic pain: Secondary | ICD-10-CM | POA: Diagnosis not present

## 2020-09-11 DIAGNOSIS — E039 Hypothyroidism, unspecified: Secondary | ICD-10-CM | POA: Diagnosis not present

## 2020-09-11 DIAGNOSIS — E559 Vitamin D deficiency, unspecified: Secondary | ICD-10-CM | POA: Diagnosis not present

## 2020-10-09 DIAGNOSIS — E559 Vitamin D deficiency, unspecified: Secondary | ICD-10-CM | POA: Diagnosis not present

## 2020-10-09 DIAGNOSIS — G8929 Other chronic pain: Secondary | ICD-10-CM | POA: Diagnosis not present

## 2020-10-09 DIAGNOSIS — E039 Hypothyroidism, unspecified: Secondary | ICD-10-CM | POA: Diagnosis not present

## 2020-11-06 DIAGNOSIS — E559 Vitamin D deficiency, unspecified: Secondary | ICD-10-CM | POA: Diagnosis not present

## 2020-11-06 DIAGNOSIS — K219 Gastro-esophageal reflux disease without esophagitis: Secondary | ICD-10-CM | POA: Diagnosis not present

## 2020-11-06 DIAGNOSIS — G8929 Other chronic pain: Secondary | ICD-10-CM | POA: Diagnosis not present

## 2020-11-06 DIAGNOSIS — E039 Hypothyroidism, unspecified: Secondary | ICD-10-CM | POA: Diagnosis not present

## 2020-12-04 DIAGNOSIS — M5459 Other low back pain: Secondary | ICD-10-CM | POA: Diagnosis not present

## 2020-12-04 DIAGNOSIS — G8929 Other chronic pain: Secondary | ICD-10-CM | POA: Diagnosis not present

## 2020-12-04 DIAGNOSIS — I110 Hypertensive heart disease with heart failure: Secondary | ICD-10-CM | POA: Diagnosis not present

## 2020-12-04 DIAGNOSIS — E7849 Other hyperlipidemia: Secondary | ICD-10-CM | POA: Diagnosis not present

## 2020-12-10 DIAGNOSIS — E7849 Other hyperlipidemia: Secondary | ICD-10-CM | POA: Diagnosis not present

## 2020-12-10 DIAGNOSIS — I110 Hypertensive heart disease with heart failure: Secondary | ICD-10-CM | POA: Diagnosis not present

## 2020-12-10 DIAGNOSIS — D510 Vitamin B12 deficiency anemia due to intrinsic factor deficiency: Secondary | ICD-10-CM | POA: Diagnosis not present

## 2020-12-10 DIAGNOSIS — E559 Vitamin D deficiency, unspecified: Secondary | ICD-10-CM | POA: Diagnosis not present

## 2020-12-16 ENCOUNTER — Encounter: Payer: Self-pay | Admitting: Podiatry

## 2020-12-16 ENCOUNTER — Other Ambulatory Visit: Payer: Self-pay

## 2020-12-16 ENCOUNTER — Ambulatory Visit (INDEPENDENT_AMBULATORY_CARE_PROVIDER_SITE_OTHER): Payer: Medicare Other | Admitting: Podiatry

## 2020-12-16 DIAGNOSIS — I739 Peripheral vascular disease, unspecified: Secondary | ICD-10-CM

## 2020-12-16 DIAGNOSIS — B351 Tinea unguium: Secondary | ICD-10-CM | POA: Diagnosis not present

## 2020-12-16 DIAGNOSIS — M79676 Pain in unspecified toe(s): Secondary | ICD-10-CM

## 2020-12-16 DIAGNOSIS — L84 Corns and callosities: Secondary | ICD-10-CM

## 2020-12-16 NOTE — Progress Notes (Signed)
  Subjective:  Patient ID: Jared Beck, male    DOB: Apr 20, 1961,  MRN: [id number]  60 y.o. male presents for at risk foot care. Patient has h/o PAD and callus(es) b/l feet and painful thick toenails that are difficult to trim. Painful toenails interfere with ambulation. Aggravating factors include wearing enclosed shoe gear. Pain is relieved with periodic professional debridement. Painful calluses are aggravated when weightbearing with and without shoegear. Pain is relieved with periodic professional debridement.   Patient states he lost his fiance' the beginning of this month and he is still dealing with that loss.   He notes no new pedal complaints on today's visit.  PCP is Dr. Oval Linsey and last visit was Dec 04, 2020.  Allergies  Allergen Reactions  . Cephalexin Itching and Swelling  . Penicillin G Anaphylaxis  . Hydrocodone Itching    Other reaction(s): Itching  . Vicodin [Hydrocodone-Acetaminophen] Itching    Review of Systems: Negative except as noted in the HPI.   Objective:   Constitutional Pt is a pleasant 60 y.o. Caucasian male WD, WN in NAD. AAO x 3.   Vascular Capillary refill time to digits immediate right lower extremity. Capillary fill time to digits delayed left lower extremity. Palpable DP pulse(s) right lower extremity Palpable PT pulse(s) right lower extremity Nonpalpable DP pulse(s) left lower extremity. Nonpalpable PT pulse(s) left lower extremity. Pedal hair sparse. Lower extremity skin temperature gradient within normal limits. No pain with calf compression b/l. No edema noted b/l lower extremities.  Neurologic Protective sensation intact 5/5 intact bilaterally with 10g monofilament b/l. Babinski reflex negative b/l. Clonus negative b/l.  Dermatologic Pedal skin with normal turgor, texture and tone bilaterally. No open wounds bilaterally. No interdigital macerations bilaterally. Toenails 1-5 b/l elongated, discolored, dystrophic, thickened, crumbly with  subungual debris and tenderness to dorsal palpation. Hyperkeratotic lesion(s) submet head 1 left foot and submet head 5 right foot.  No erythema, no edema, no drainage, no fluctuance.  Orthopedic: Normal muscle strength 5/5 to all lower extremity muscle groups bilaterally. Severe hammertoe deformity noted 1-5 bilaterally.   Radiographs: None  ABIs: September 2021 (Dr. Tawanna Cooler [facility] of [facility])  ABI LLE: 0.74 with monophasic flow. Assessment:   1. Pain due to onychomycosis of toenail   2. Callus   3. PAD (peripheral artery disease) (HCC)    Plan:  Patient was evaluated and treated and all questions answered.  Onychomycosis with pain -Nails palliatively debridement as below. -Educated on self-care  Procedure: Nail Debridement Rationale: Pain Type of Debridement: manual, sharp debridement. Instrumentation: Nail nipper, rotary burr. Number of Nails: 10  -Examined patient. -Patient to continue soft, supportive shoe gear daily. -Toenails 1-5 b/l were debrided in length and girth with sterile nail nippers and dremel without iatrogenic bleeding.  -Callus(es) submet head 1 left foot and submet head 5 right foot pared utilizing sterile scalpel blade without complication or incident. Total number debrided =2. -Patient to report any pedal injuries to medical professional immediately. -Patient/POA to call should there be question/concern in the interim.  Return in about 3 months (around 03/18/2021).  Freddie Breech, DPM

## 2021-01-01 DIAGNOSIS — I110 Hypertensive heart disease with heart failure: Secondary | ICD-10-CM | POA: Diagnosis not present

## 2021-01-01 DIAGNOSIS — E7849 Other hyperlipidemia: Secondary | ICD-10-CM | POA: Diagnosis not present

## 2021-01-01 DIAGNOSIS — K219 Gastro-esophageal reflux disease without esophagitis: Secondary | ICD-10-CM | POA: Diagnosis not present

## 2021-02-10 ENCOUNTER — Ambulatory Visit
Admission: EM | Admit: 2021-02-10 | Discharge: 2021-02-10 | Disposition: A | Discharge status: Home / Self Care | Payer: Medicare Other | Attending: Family Medicine | Admitting: Family Medicine

## 2021-02-10 DIAGNOSIS — J069 Acute upper respiratory infection, unspecified: Secondary | ICD-10-CM | POA: Diagnosis not present

## 2021-02-10 MED ORDER — BENZONATATE 100 MG PO CAPS: ORAL_CAPSULE | ORAL | 0 refills | Status: DC
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
## 2021-02-10 NOTE — Discharge Instructions (Addendum)
You have been tested for COVID-19 today. °If your test returns positive, you will receive a phone call from [facility] regarding your results. °Negative test results are not called. °Both positive and negative results area always visible on MyChart. °If you do not have a MyChart account, sign up instructions are provided in your discharge papers. °Please do not hesitate to contact us should you have questions or concerns. ° °

## 2021-02-10 NOTE — ED Triage Notes (Signed)
Pt presents with c/o cough and nasal congestion as well as low grade fever for past couple days

## 2021-02-11 LAB — COVID-19, FLU A+B NAA
Influenza A, NAA: NOT DETECTED
Influenza B, NAA: NOT DETECTED
SARS-CoV-2, NAA: NOT DETECTED

## 2021-02-12 ENCOUNTER — Emergency Department (HOSPITAL_COMMUNITY)
Admission: EM | Admit: 2021-02-12 | Discharge: 2021-02-12 | Disposition: A | Discharge status: Home / Self Care | Payer: Medicare Other | Attending: Emergency Medicine | Admitting: Emergency Medicine

## 2021-02-12 ENCOUNTER — Encounter (HOSPITAL_COMMUNITY): Payer: Self-pay | Admitting: Emergency Medicine

## 2021-02-12 ENCOUNTER — Other Ambulatory Visit: Payer: Self-pay

## 2021-02-12 DIAGNOSIS — Z79899 Other long term (current) drug therapy: Secondary | ICD-10-CM | POA: Insufficient documentation

## 2021-02-12 DIAGNOSIS — R0981 Nasal congestion: Secondary | ICD-10-CM | POA: Insufficient documentation

## 2021-02-12 DIAGNOSIS — J3489 Other specified disorders of nose and nasal sinuses: Secondary | ICD-10-CM | POA: Insufficient documentation

## 2021-02-12 DIAGNOSIS — Z96612 Presence of left artificial shoulder joint: Secondary | ICD-10-CM | POA: Diagnosis not present

## 2021-02-12 DIAGNOSIS — R059 Cough, unspecified: Secondary | ICD-10-CM | POA: Diagnosis present

## 2021-02-12 DIAGNOSIS — J069 Acute upper respiratory infection, unspecified: Secondary | ICD-10-CM

## 2021-02-12 MED ORDER — GUAIFENESIN 100 MG/5ML PO SYRP: 100.0000 mg | ORAL_SOLUTION | ORAL | 0 refills | Status: DC | PRN

## 2021-02-12 MED ORDER — FAMOTIDINE 20 MG PO TABS: 20.0000 mg | ORAL_TABLET | Freq: Two times a day (BID) | ORAL | 0 refills | Status: DC

## 2021-02-12 NOTE — ED Provider Notes (Signed)
[facility] EMERGENCY DEPARTMENT Provider Note   CSN: [id number] Arrival date & time: 02/12/21  0944     History Chief Complaint  Patient presents with   Cough    Jared Beck is a 60 y.o. male.   Cough Associated symptoms: no chest pain, no chills, no fever and no shortness of breath    Patient presents with cough x5 days.  It is been consistent, worse after eating.  Associated with nasal congestion and clear rhinorrhea.  He has tried Tylenol and benzonatate Perles without much relief.  Was seen at urgent care 2 days ago and tested for COVID and flu, he was negative.  He is vaccinated against flu and COVID.  States that when he coughs too much he coughs up his old meals, but he denies any vomiting or nausea.  He has not had any fevers or chills at home.  He has not noted anything makes it worse other than eating.  Past Medical History:  Diagnosis Date   Anxiety    Arthritis    Arthritis    Complication of anesthesia    DDD (degenerative disc disease), lumbar    Depression    GERD (gastroesophageal reflux disease)    occ   Gout    Gout    PONV (postoperative nausea and vomiting)    nausea    Patient Active Problem List   Diagnosis Date Noted   Major depression, recurrent (HCC) 05/09/2018   Wound dehiscence 12/21/2016   S/P lumbar fusion 05/11/2016   Leg pain, bilateral 03/26/2016   Chronic gout 03/12/2016   DDD (degenerative disc disease), lumbar 03/12/2016   Lumbar facet arthropathy 03/12/2016   Radiculopathy, lumbar region 03/12/2016   Rheumatoid arteritis (HCC) 03/12/2016   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   Mixed hyperlipidemia 05/16/2015   Osteoarthritis of spine with radiculopathy, lumbar region 05/16/2015   Restless leg syndrome 05/16/2015   Total bilirubin, elevated 05/16/2015   S/P shoulder replacement 12/13/2014   Presence of artificial shoulder joint 12/13/2014   History of gout 07/26/2014   Primary osteoarthritis of both shoulders  07/26/2014   Osteoarthritis of ankle 03/26/2014   Osteoarthritis, hand 03/26/2014   Complete rupture of rotator cuff 04/27/2013   Pain in joint, shoulder region 04/27/2013   Muscle weakness (generalized) 04/27/2013   Allergy to penicillin 02/13/2013    Past Surgical History:  Procedure Laterality Date   APPENDECTOMY  32   BACK SURGERY     CHOLECYSTECTOMY  10   HERNIA REPAIR  93   umbilical, groin as child   REVERSE SHOULDER ARTHROPLASTY Left 12/13/2014   Procedure: LEFT REVERSE TOTAL SHOULDER ARTHROPLASTY;  Surgeon: Beverely Low, MD;  Location: [facility] OR;  Service: Orthopedics;  Laterality: Left;   rotator cuff surgery Left 14       Family History  Problem Relation Age of Onset   Cancer Mother    Cancer Father    Heart disease Father    Arrhythmia Brother        Needs pacer   Stroke Brother     Social History   Tobacco Use   Smoking status: Never   Smokeless tobacco: Never  Vaping Use   Vaping Use: Never used  Substance Use Topics   Alcohol use: Never   Drug use: Never    Home Medications Prior to Admission medications   Medication Sig Start Date End Date Taking? Authorizing Provider  albuterol (PROVENTIL HFA;VENTOLIN HFA) 108 (90 Base) MCG/ACT inhaler Inhale 1-2 puffs into the lungs  every 6 (six) hours as needed for wheezing or shortness of breath.     [provider]  allopurinol (ZYLOPRIM) 100 MG tablet Take 1 tablet (100 mg total) by mouth daily. 05/14/18   Money, Gerlene Burdock, FNP  allopurinol (ZYLOPRIM) 300 MG tablet Take 300 mg by mouth daily. 12/02/20   [provider]  benzonatate (TESSALON) 100 MG capsule Take 1 capsule by mouth every 8 (eight) hours for cough. 02/10/21   Mardella Layman, MD  Cholecalciferol (VITAMIN D3) 5000 units CAPS Take 5,000 Units by mouth daily.    [provider]  FLUoxetine (PROZAC) 10 MG capsule Take 1 capsule (10 mg total) by mouth daily. For mood control 05/14/18   Money, Gerlene Burdock, FNP  gabapentin (NEURONTIN)  300 MG capsule Take 300 mg by mouth 3 (three) times daily.    [provider]  levothyroxine (SYNTHROID, LEVOTHROID) 75 MCG tablet Take 75 mcg by mouth daily. 04/19/18   [provider]  naproxen (NAPROSYN) 250 MG tablet Take 1 po BID with food prn pain 06/11/18   Devoria Albe, MD  naproxen (NAPROSYN) 500 MG tablet Take 500 mg by mouth 2 (two) times daily. 12/08/20   [provider]  oxyCODONE-acetaminophen (PERCOCET/ROXICET) 5-325 MG tablet Take 1 tablet by mouth 3 (three) times daily.  11/04/16   [provider]  pramipexole (MIRAPEX) 0.5 MG tablet Take 0.5 mg by mouth every evening.    [provider]  traZODone (DESYREL) 50 MG tablet Take 1 tablet (50 mg total) by mouth at bedtime as needed for sleep. 05/13/18   Money, Gerlene Burdock, FNP    Allergies    Cephalexin, Penicillin g, Hydrocodone, and Vicodin [hydrocodone-acetaminophen]  Review of Systems   Review of Systems  Constitutional:  Negative for chills and fever.  HENT:  Positive for congestion.   Respiratory:  Positive for cough. Negative for shortness of breath.   Cardiovascular:  Negative for chest pain.   Physical Exam Updated Vital Signs BP 100/80 (BP Location: Right Arm)   Pulse 84   Temp 98.5 F (36.9 C)   Resp 19   Ht 5\' 7"  (1.702 m)   Wt 76.2 kg   SpO2 96%   BMI 26.31 kg/m   Physical Exam Vitals and nursing note reviewed. Exam conducted with a chaperone present.  Constitutional:      General: He is not in acute distress.    Appearance: Normal appearance.     Comments: Patient is resting comfortably, no acute distress.  HENT:     Head: Normocephalic and atraumatic.     Nose: Congestion and rhinorrhea present.     Comments: Nasal turbinates injected bilaterally Eyes:     General: No scleral icterus.    Extraocular Movements: Extraocular movements intact.     Pupils: Pupils are equal, round, and reactive to light.  Pulmonary:     Effort: Pulmonary effort is normal. No  respiratory distress.     Breath sounds: Normal breath sounds.     Comments: Lungs are clear to auscultation bilaterally, no wheezes, rales or rhonchi.  He is speaking complete sentences without accessory muscle use. Skin:    Coloration: Skin is not jaundiced.  Neurological:     Mental Status: He is alert. Mental status is at baseline.     Coordination: Coordination normal.    ED Results / Procedures / Treatments   Labs (all labs ordered are listed, but only abnormal results are displayed) Labs Reviewed - No data to display  EKG None  Radiology No results found.  Procedures Procedures   Medications Ordered in ED Medications - No data to display  ED Course  I have reviewed the triage vital signs and the nursing notes.  Pertinent labs & imaging results that were available during my care of the patient were reviewed by me and considered in my medical decision making (see chart for details).    MDM Rules/Calculators/A&P                           Patient vitals are stable, he is resting comfortably.  Was recently tested for COVID and flu, both were negative.  I suspect he has a viral URI, his lungs are clear to auscultation and he is not febrile or uncomfortable.  I doubt pneumonia.  He does not have a history of allergies, he does have clear rhinorrhea and nasal congestion with injected turbinates that are consistent with a viral infection.  Discussed that with the patient and advise symptomatic management.  Patient verbalized understanding and agreement with the plan.  Final Clinical Impression(s) / ED Diagnoses Final diagnoses:  None    Rx / DC Orders ED Discharge Orders     None        Theron Arista, Cordelia Poche 02/12/21 1353    Bethann Berkshire, MD 02/16/21 [phone]

## 2021-02-12 NOTE — Discharge Instructions (Addendum)
Continue taking the Tessalon Perles as needed for cough, can take Mucinex as needed for congestion.  You may also try Robitussin instead of the Tessalon Perles as needed for cough.  You take 100 to 200 mg by mouth every 4 hours as needed. I recommend taking Pepcid once daily for the next 4 weeks to see if that improves any of your symptoms.  This is a reflux medicine that can help reduce the amount of acid, this will hopefully reduce some of the regurgitation of food you are experiencing after coughing. Continue to stay hydrated and eat food, if things worsen or fail to improve the next 2 weeks she can return back to the ED for further evaluation.

## 2021-02-12 NOTE — ED Triage Notes (Signed)
Pt c/o cough x 4-5 days, seen at [facility] for same; no relief with tessalon pearls

## 2021-02-16 ENCOUNTER — Encounter (HOSPITAL_COMMUNITY): Payer: Self-pay

## 2021-02-16 ENCOUNTER — Other Ambulatory Visit: Payer: Self-pay

## 2021-02-16 ENCOUNTER — Emergency Department (HOSPITAL_COMMUNITY)
Admission: EM | Admit: 2021-02-16 | Discharge: 2021-02-16 | Disposition: A | Discharge status: Home / Self Care | Payer: Medicare Other | Attending: Emergency Medicine | Admitting: Emergency Medicine

## 2021-02-16 ENCOUNTER — Emergency Department (HOSPITAL_COMMUNITY): Payer: Medicare Other

## 2021-02-16 DIAGNOSIS — M542 Cervicalgia: Secondary | ICD-10-CM | POA: Diagnosis present

## 2021-02-16 DIAGNOSIS — Z96612 Presence of left artificial shoulder joint: Secondary | ICD-10-CM | POA: Insufficient documentation

## 2021-02-16 DIAGNOSIS — S161XXA Strain of muscle, fascia and tendon at neck level, initial encounter: Secondary | ICD-10-CM

## 2021-02-16 DIAGNOSIS — M5030 Other cervical disc degeneration, unspecified cervical region: Secondary | ICD-10-CM

## 2021-02-16 MED ORDER — ACETAMINOPHEN 500 MG PO TABS
1000.0000 mg | ORAL_TABLET | Freq: Once | ORAL | Status: AC
  Administered 2021-02-16: 1000 mg via ORAL
  Filled 2021-02-16: qty 2

## 2021-02-16 MED ORDER — PREDNISONE 20 MG PO TABS: ORAL_TABLET | ORAL | 0 refills | Status: DC

## 2021-02-16 MED ORDER — METHOCARBAMOL 750 MG PO TABS: 750.0000 mg | ORAL_TABLET | Freq: Three times a day (TID) | ORAL | 0 refills | Status: DC | PRN

## 2021-02-16 MED ORDER — METHOCARBAMOL 500 MG PO TABS
750.0000 mg | ORAL_TABLET | Freq: Once | ORAL | Status: AC
  Administered 2021-02-16: 750 mg via ORAL
  Filled 2021-02-16: qty 2

## 2021-02-16 NOTE — Discharge Instructions (Addendum)
It was our pleasure to provide your ER care today - we hope that you feel better.  Your ct was read as: Mild to moderate multilevel disc space height loss and  osteophytosis throughout the cervical spine, worst at C5 through C7.   Take prednisone as prescribed. Take acetaminophen as need for pain. You may also try robaxin as need for muscle pain/spasm - no driving for the next 6 hrs or when taking.  Try heat therapy.   Follow up with your doctor in the next few weeks.  Return to ER if worse, new symptoms, fevers, severe pain, numbness/weakness, or other concern.

## 2021-02-16 NOTE — ED Provider Notes (Signed)
[facility] EMERGENCY DEPARTMENT Provider Note   CSN: [id number] Arrival date & time: 02/16/21  6226     History Chief Complaint  Patient presents with   Neck Pain    Jared Beck is a 60 y.o. male.  Patient c/o neck pain for past week. Hx ddd. Symptoms acute onset, moderate, dull, non radiating, worse w certain neck movements. Denies trauma, fall or injury. No radicular pain. No arm numbness/weakness, or loss of normal functional ability. Hx ddd in lower back previously. Denies any anterior neck pain or swelling. No wt loss. No trouble breathing or swallowing. No headache. No chest pain. No fever or chills.   The history is provided by the patient.  Neck Pain Associated symptoms: no chest pain, no fever, no headaches, no numbness and no weakness       Past Medical History:  Diagnosis Date   Anxiety    Arthritis    Arthritis    Complication of anesthesia    DDD (degenerative disc disease), lumbar    Depression    GERD (gastroesophageal reflux disease)    occ   Gout    Gout    PONV (postoperative nausea and vomiting)    nausea    Patient Active Problem List   Diagnosis Date Noted   Major depression, recurrent (HCC) 05/09/2018   Wound dehiscence 12/21/2016   S/P lumbar fusion 05/11/2016   Leg pain, bilateral 03/26/2016   Chronic gout 03/12/2016   DDD (degenerative disc disease), lumbar 03/12/2016   Lumbar facet arthropathy 03/12/2016   Radiculopathy, lumbar region 03/12/2016   Rheumatoid arteritis (HCC) 03/12/2016   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   Mixed hyperlipidemia 05/16/2015   Osteoarthritis of spine with radiculopathy, lumbar region 05/16/2015   Restless leg syndrome 05/16/2015   Total bilirubin, elevated 05/16/2015   S/P shoulder replacement 12/13/2014   Presence of artificial shoulder joint 12/13/2014   History of gout 07/26/2014   Primary osteoarthritis of both shoulders 07/26/2014   Osteoarthritis of ankle 03/26/2014    Osteoarthritis, hand 03/26/2014   Complete rupture of rotator cuff 04/27/2013   Pain in joint, shoulder region 04/27/2013   Muscle weakness (generalized) 04/27/2013   Allergy to penicillin 02/13/2013    Past Surgical History:  Procedure Laterality Date   APPENDECTOMY  79   BACK SURGERY     CHOLECYSTECTOMY  10   HERNIA REPAIR  93   umbilical, groin as child   REVERSE SHOULDER ARTHROPLASTY Left 12/13/2014   Procedure: LEFT REVERSE TOTAL SHOULDER ARTHROPLASTY;  Surgeon: Beverely Low, MD;  Location: [facility] OR;  Service: Orthopedics;  Laterality: Left;   rotator cuff surgery Left 16       Family History  Problem Relation Age of Onset   Cancer Mother    Cancer Father    Heart disease Father    Arrhythmia Brother        Needs pacer   Stroke Brother     Social History   Tobacco Use   Smoking status: Never   Smokeless tobacco: Never  Vaping Use   Vaping Use: Never used  Substance Use Topics   Alcohol use: Never   Drug use: Never    Home Medications Prior to Admission medications   Medication Sig Start Date End Date Taking? Authorizing Provider  albuterol (PROVENTIL HFA;VENTOLIN HFA) 108 (90 Base) MCG/ACT inhaler Inhale 1-2 puffs into the lungs every 6 (six) hours as needed for wheezing or shortness of breath.     [provider]  allopurinol (ZYLOPRIM)  100 MG tablet Take 1 tablet (100 mg total) by mouth daily. 05/14/18   Money, Gerlene Burdock, FNP  allopurinol (ZYLOPRIM) 300 MG tablet Take 300 mg by mouth daily. 12/02/20   [provider]  benzonatate (TESSALON) 100 MG capsule Take 1 capsule by mouth every 8 (eight) hours for cough. 02/10/21   Mardella Layman, MD  Cholecalciferol (VITAMIN D3) 5000 units CAPS Take 5,000 Units by mouth daily.    [provider]  famotidine (PEPCID) 20 MG tablet Take 1 tablet (20 mg total) by mouth 2 (two) times daily. 02/12/21   Theron Arista, PA-C  FLUoxetine (PROZAC) 10 MG capsule Take 1 capsule (10 mg total) by mouth daily. For  mood control 05/14/18   Money, Gerlene Burdock, FNP  gabapentin (NEURONTIN) 300 MG capsule Take 300 mg by mouth 3 (three) times daily.    [provider]  guaifenesin (ROBITUSSIN) 100 MG/5ML syrup Take 5-10 mLs (100-200 mg total) by mouth every 4 (four) hours as needed for cough. 02/12/21   Theron Arista, PA-C  levothyroxine (SYNTHROID, LEVOTHROID) 75 MCG tablet Take 75 mcg by mouth daily. 04/19/18   [provider]  naproxen (NAPROSYN) 250 MG tablet Take 1 po BID with food prn pain 06/11/18   Devoria Albe, MD  naproxen (NAPROSYN) 500 MG tablet Take 500 mg by mouth 2 (two) times daily. 12/08/20   [provider]  oxyCODONE-acetaminophen (PERCOCET/ROXICET) 5-325 MG tablet Take 1 tablet by mouth 3 (three) times daily.  11/04/16   [provider]  pramipexole (MIRAPEX) 0.5 MG tablet Take 0.5 mg by mouth every evening.    [provider]  traZODone (DESYREL) 50 MG tablet Take 1 tablet (50 mg total) by mouth at bedtime as needed for sleep. 05/13/18   Money, Gerlene Burdock, FNP    Allergies    Cephalexin, Penicillin g, Hydrocodone, and Vicodin [hydrocodone-acetaminophen]  Review of Systems   Review of Systems  Constitutional:  Negative for fever.  HENT:  Negative for sore throat.   Eyes:  Negative for redness.  Respiratory:  Negative for shortness of breath.   Cardiovascular:  Negative for chest pain.  Gastrointestinal:  Negative for abdominal pain.  Genitourinary:  Negative for flank pain.  Musculoskeletal:  Positive for neck pain. Negative for back pain.  Skin:  Negative for rash.  Neurological:  Negative for weakness, numbness and headaches.  Hematological:  Does not bruise/bleed easily.  Psychiatric/Behavioral:  Negative for confusion.    Physical Exam Updated Vital Signs BP 137/87 (BP Location: Left Arm)   Pulse 75   Temp 97.9 F (36.6 C) (Oral)   Resp 17   Ht 1.702 m (5\' 7" )   Wt 76.2 kg   SpO2 98%   BMI 26.31 kg/m   Physical Exam Vitals and nursing  note reviewed.  Constitutional:      Appearance: Normal appearance. He is well-developed.  HENT:     Head: Atraumatic.     Nose: Nose normal.     Mouth/Throat:     Mouth: Mucous membranes are moist.  Eyes:     General: No scleral icterus.    Conjunctiva/sclera: Conjunctivae normal.  Neck:     Trachea: No tracheal deviation.     Comments: Trachea midline. No swelling or mass.  Cardiovascular:     Rate and Rhythm: Normal rate.     Pulses: Normal pulses.  Pulmonary:     Effort: Pulmonary effort is normal. No accessory muscle usage or respiratory distress.  Abdominal:  General: There is no distension.     Tenderness: There is no abdominal tenderness.  Genitourinary:    Comments: No cva tenderness. Musculoskeletal:        General: No swelling.     Cervical back: Normal range of motion and neck supple. No rigidity.     Comments: Mid cervical tenderness, and bilateral paranspinal/trap muscular tenderness. No sts.   Skin:    General: Skin is warm and dry.     Findings: No rash.  Neurological:     Mental Status: He is alert.     Comments: Alert, speech clear. Motor intact bil, stre 5/5. Sens grossly intact. Steady gait.  Psychiatric:        Mood and Affect: Mood normal.    ED Results / Procedures / Treatments   Labs (all labs ordered are listed, but only abnormal results are displayed) Labs Reviewed - No data to display  EKG None  Radiology CT Cervical Spine Wo Contrast  Result Date: 02/16/2021 CLINICAL DATA:  Chronic neck pain, unable to turn head, no specific injury EXAM: CT CERVICAL SPINE WITHOUT CONTRAST TECHNIQUE: Multidetector CT imaging of the cervical spine was performed without intravenous contrast. Multiplanar CT image reconstructions were also generated. COMPARISON:  None. FINDINGS: Alignment: Normal. Skull base and vertebrae: No acute fracture. No primary bone lesion or focal pathologic process. Soft tissues and spinal canal: No prevertebral fluid or swelling. No  visible canal hematoma. Disc levels: Mild to moderate multilevel disc space height loss and osteophytosis throughout the cervical spine, worst at C5 through C7. Upper chest: Negative. Other: None. IMPRESSION: 1. No fracture or static subluxation of the cervical spine. 2. Mild to moderate multilevel disc space height loss and osteophytosis throughout the cervical spine, worst at C5 through C7. Cervical disc and neural foraminal pathology may be further evaluated by MRI if indicated by neurologically localizing signs and symptoms. Electronically Signed   By: Lauralyn Primes M.D.   On: 02/16/2021 14:33    Procedures Procedures   Medications Ordered in ED Medications - No data to display  ED Course  I have reviewed the triage vital signs and the nursing notes.  Pertinent labs & imaging results that were available during my care of the patient were reviewed by me and considered in my medical decision making (see chart for details).    MDM Rules/Calculators/A&P                          CT imaging.   Reviewed nursing notes and prior charts for additional history.   No meds pta. Pt has ride, does not have to drive. Acetaminophen po, robaxin po.   CT reviewed/interpreted by me-  no fx.  Degen changes.   Pt appears stable for d/c.     Final Clinical Impression(s) / ED Diagnoses Final diagnoses:  None    Rx / DC Orders ED Discharge Orders     None        Cathren Laine, MD 02/16/21 1439

## 2021-02-16 NOTE — ED Triage Notes (Signed)
Pt. States they haven't been able to turn their head since last Thursday. Pt. States they used Icy hot with no relief.

## 2021-02-16 NOTE — ED Notes (Signed)
Patient transported to CT 

## 2021-03-04 ENCOUNTER — Ambulatory Visit: Payer: Medicare Other | Admitting: Nurse Practitioner

## 2021-03-17 ENCOUNTER — Other Ambulatory Visit: Payer: Self-pay

## 2021-03-17 ENCOUNTER — Ambulatory Visit (INDEPENDENT_AMBULATORY_CARE_PROVIDER_SITE_OTHER): Payer: Medicare Other | Admitting: Podiatry

## 2021-03-17 DIAGNOSIS — I739 Peripheral vascular disease, unspecified: Secondary | ICD-10-CM | POA: Diagnosis not present

## 2021-03-17 DIAGNOSIS — B351 Tinea unguium: Secondary | ICD-10-CM | POA: Diagnosis not present

## 2021-03-17 DIAGNOSIS — L84 Corns and callosities: Secondary | ICD-10-CM | POA: Diagnosis not present

## 2021-03-17 DIAGNOSIS — M79676 Pain in unspecified toe(s): Secondary | ICD-10-CM

## 2021-03-18 ENCOUNTER — Encounter: Payer: Self-pay | Admitting: Nurse Practitioner

## 2021-03-18 ENCOUNTER — Ambulatory Visit (INDEPENDENT_AMBULATORY_CARE_PROVIDER_SITE_OTHER): Payer: Medicare Other | Admitting: Nurse Practitioner

## 2021-03-18 VITALS — BP 90/70 | HR 68 | Temp 98.4°F | Ht 67.0 in | Wt 167.6 lb

## 2021-03-18 DIAGNOSIS — M109 Gout, unspecified: Secondary | ICD-10-CM

## 2021-03-18 DIAGNOSIS — M5136 Other intervertebral disc degeneration, lumbar region: Secondary | ICD-10-CM

## 2021-03-18 DIAGNOSIS — G2581 Restless legs syndrome: Secondary | ICD-10-CM | POA: Diagnosis not present

## 2021-03-18 DIAGNOSIS — E039 Hypothyroidism, unspecified: Secondary | ICD-10-CM | POA: Diagnosis not present

## 2021-03-18 DIAGNOSIS — G4700 Insomnia, unspecified: Secondary | ICD-10-CM

## 2021-03-18 DIAGNOSIS — F325 Major depressive disorder, single episode, in full remission: Secondary | ICD-10-CM | POA: Diagnosis not present

## 2021-03-18 NOTE — Patient Instructions (Signed)
Sign record release from previous PCP  Follow up in 1 months for routine follow up. BRING MEDICATION BOTTLES AT NEXT VISIT

## 2021-03-18 NOTE — Progress Notes (Signed)
Careteam: Patient Care Team: Sharon Seller, NP as PCP - General (Geriatric Medicine)  PLACE OF SERVICE:  [facility]  Advanced Directive information Does Patient Have a Medical Advance Directive?: No, Would patient like information on creating a medical advance directive?: Yes (MAU/Ambulatory/Procedural Areas - Information given)  Allergies  Allergen Reactions   Cephalexin Itching and Swelling   Penicillin G Anaphylaxis   Hydrocodone Itching    Other reaction(s): Itching   Vicodin [Hydrocodone-Acetaminophen] Itching    Chief Complaint  Patient presents with   Establish Care    New patient establish care. Flu vaccine today Patient is having problems with hands shaking that started about a month ago.Patient prefers to get Flu vaccine in October     HPI: Patient is a 60 y.o. male to establish care.   Gout- on allopurinol- no recent flare- last 3 years ago.   Vit D def- on supplement.   DDD- had back surgery. Takes gabapentin three times daily and on naprozen twice daily   Depression- on prozac 10 mg daily    Hypothyroid- on synthroid 75 mcg daily   Restless leg syndrom on mrapex, controlled   Insomnia- on trazodone 50 mg PRn sleep, helps  He currently does not work- on disability.   Girlfriend passed away in 2022-12-24. He has his brothers for support.   Tremor- will happen about 2-3 times a week and last only a minute or 2.  Review of Systems:  Review of Systems  Constitutional:  Negative for chills, fever and weight loss.  HENT:  Negative for tinnitus.   Respiratory:  Negative for cough, sputum production and shortness of breath.   Cardiovascular:  Negative for chest pain, palpitations and leg swelling.  Gastrointestinal:  Negative for abdominal pain, constipation, diarrhea and heartburn.  Genitourinary:  Negative for dysuria, frequency and urgency.  Musculoskeletal:  Positive for back pain. Negative for falls, joint pain and myalgias.  Skin: Negative.    Neurological:  Negative for dizziness and headaches.  Psychiatric/Behavioral:  Positive for depression (controlled on medication). Negative for memory loss. The patient does not have insomnia.    Past Medical History:  Diagnosis Date   Anxiety    Arthritis    Arthritis    Complication of anesthesia    DDD (degenerative disc disease), lumbar    Depression    GERD (gastroesophageal reflux disease)    occ   Gout    PONV (postoperative nausea and vomiting)    nausea   Past Surgical History:  Procedure Laterality Date   APPENDECTOMY  79   BACK SURGERY     CHOLECYSTECTOMY  10   HERNIA REPAIR  93   umbilical, groin as child   REVERSE SHOULDER ARTHROPLASTY Left 12/13/2014   Procedure: LEFT REVERSE TOTAL SHOULDER ARTHROPLASTY;  Surgeon: Beverely Low, MD;  Location: [facility] OR;  Service: Orthopedics;  Laterality: Left;   rotator cuff surgery Left 16   Social History:   reports that he has never smoked. He has never used smokeless tobacco. He reports that he does not drink alcohol and does not use drugs.  Family History  Problem Relation Age of Onset   Cancer Mother    Cancer Father    Heart disease Father    Arrhythmia Brother        Needs pacer   Stroke Brother    Heart attack Brother    Heart attack Brother    Stroke Brother    Cancer Brother     Medications:  Patient's Medications  New Prescriptions   No medications on file  Previous Medications   ALLOPURINOL (ZYLOPRIM) 300 MG TABLET    Take 300 mg by mouth daily.   CHOLECALCIFEROL (VITAMIN D3) 5000 UNITS CAPS    Take 5,000 Units by mouth daily. Take two daily   FLUOXETINE (PROZAC) 10 MG CAPSULE    Take 1 capsule (10 mg total) by mouth daily. For mood control   GABAPENTIN (NEURONTIN) 300 MG CAPSULE    Take 300 mg by mouth 3 (three) times daily.   LEVOTHYROXINE (SYNTHROID, LEVOTHROID) 75 MCG TABLET    Take 75 mcg by mouth daily.   NAPROXEN (NAPROSYN) 500 MG TABLET    Take 500 mg by mouth 2 (two) times daily.   PRAMIPEXOLE  (MIRAPEX) 0.5 MG TABLET    Take 0.5 mg by mouth every evening.   TRAZODONE (DESYREL) 50 MG TABLET    Take 1 tablet (50 mg total) by mouth at bedtime as needed for sleep.  Modified Medications   No medications on file  Discontinued Medications   ALBUTEROL (PROVENTIL HFA;VENTOLIN HFA) 108 (90 BASE) MCG/ACT INHALER    Inhale 1-2 puffs into the lungs every 6 (six) hours as needed for wheezing or shortness of breath.    ALLOPURINOL (ZYLOPRIM) 100 MG TABLET    Take 1 tablet (100 mg total) by mouth daily.   BENZONATATE (TESSALON) 100 MG CAPSULE    Take 1 capsule by mouth every 8 (eight) hours for cough.   FAMOTIDINE (PEPCID) 20 MG TABLET    Take 1 tablet (20 mg total) by mouth 2 (two) times daily.   GUAIFENESIN (ROBITUSSIN) 100 MG/5ML SYRUP    Take 5-10 mLs (100-200 mg total) by mouth every 4 (four) hours as needed for cough.   METHOCARBAMOL (ROBAXIN) 750 MG TABLET    Take 1 tablet (750 mg total) by mouth 3 (three) times daily as needed (muscle spasm/pain).   NAPROXEN (NAPROSYN) 250 MG TABLET    Take 1 po BID with food prn pain   OXYCODONE (OXY IR/ROXICODONE) 5 MG IMMEDIATE RELEASE TABLET    Take 5 mg by mouth 3 (three) times daily.   OXYCODONE-ACETAMINOPHEN (PERCOCET/ROXICET) 5-325 MG TABLET    Take 1 tablet by mouth 3 (three) times daily.    PREDNISONE (DELTASONE) 20 MG TABLET    3 po once a day for 2 days, then 2 po once a day for 3 days, then 1 po once a day for 3 days    Physical Exam:  Vitals:   03/18/21 1315  BP: 90/70  Pulse: 68  Temp: 98.4 F (36.9 C)  TempSrc: Temporal  SpO2: 98%  Weight: 167 lb 9.6 oz (76 kg)  Height: 5\' 7"  (1.702 m)   Body mass index is 26.25 kg/m. Wt Readings from Last 3 Encounters:  03/18/21 167 lb 9.6 oz (76 kg)  02/16/21 168 lb (76.2 kg)  02/12/21 168 lb (76.2 kg)    Physical Exam Constitutional:      General: He is not in acute distress.    Appearance: He is well-developed. He is not diaphoretic.  HENT:     Head: Normocephalic and atraumatic.      Right Ear: External ear normal.     Left Ear: External ear normal.     Mouth/Throat:     Pharynx: No oropharyngeal exudate.  Eyes:     Conjunctiva/sclera: Conjunctivae normal.     Pupils: Pupils are equal, round, and reactive to light.  Cardiovascular:  Rate and Rhythm: Normal rate and regular rhythm.     Heart sounds: Normal heart sounds.  Pulmonary:     Effort: Pulmonary effort is normal.     Breath sounds: Normal breath sounds.  Abdominal:     General: Bowel sounds are normal.     Palpations: Abdomen is soft.  Musculoskeletal:        General: No tenderness.     Cervical back: Normal range of motion and neck supple.     Right lower leg: No edema.     Left lower leg: No edema.  Skin:    General: Skin is warm and dry.  Neurological:     Mental Status: He is alert and oriented to person, place, and time.    Labs reviewed: Basic Metabolic Panel: No results for input(s): NA, K, CL, CO2, GLUCOSE, BUN, CREATININE, CALCIUM, MG, PHOS, TSH in the last 8760 hours. Liver Function Tests: No results for input(s): AST, ALT, ALKPHOS, BILITOT, PROT, ALBUMIN in the last 8760 hours. No results for input(s): LIPASE, AMYLASE in the last 8760 hours. No results for input(s): AMMONIA in the last 8760 hours. CBC: No results for input(s): WBC, NEUTROABS, HGB, HCT, MCV, PLT in the last 8760 hours. Lipid Panel: No results for input(s): CHOL, HDL, LDLCALC, TRIG, CHOLHDL, LDLDIRECT in the last 8760 hours. TSH: No results for input(s): TSH in the last 8760 hours. A1C: No results found for: HGBA1C   Assessment/Plan 1. Depression, major, in remission (HCC) -controlled on prozac  2. Acquired hypothyroidism -continues on synthroid 75 mcg, reports he had blood work in June. Will request records.  3. DDD (degenerative disc disease), lumbar Reports pain is well controlled continues on naproxen 500 mg BID and gabapentin  4. RLS (restless legs syndrome) Stable on miralax  5. Gout, unspecified  cause, unspecified chronicity, unspecified site No recent flares, continues on allopurinol.  6. Insomnia Controlled on trazodone PRN   Next appt: 1 month, records to be requested today for review He will also bring medication bottles in at next follow up to confirm prescriptions prior to any refills.  Janene Harvey. Biagio Borg  [facility] [phone]

## 2021-03-20 ENCOUNTER — Encounter: Payer: Self-pay | Admitting: Podiatry

## 2021-03-20 NOTE — Progress Notes (Signed)
  Subjective:  Patient ID: Jared Beck, male    DOB: 27-Feb-1961,  MRN: [id number]  60 y.o. male presents with for at risk foot care. Patient has h/o PAD and callus(es) of both feet and painful thick toenails that are difficult to trim. Painful toenails interfere with ambulation. Aggravating factors include wearing enclosed shoe gear. Pain is relieved with periodic professional debridement. Painful calluses are aggravated when weightbearing with and without shoegear. Pain is relieved with periodic professional debridement.Marland Kitchen    He notes no new pedal problems on today's visit.  PCP: Sharon Seller, NP and has upcoming appointment tomorrow, 03/18/2021.  Review of Systems: Negative except as noted in the HPI.   Allergies  Allergen Reactions   Cephalexin Itching and Swelling   Penicillin G Anaphylaxis   Hydrocodone Itching    Other reaction(s): Itching   Vicodin [Hydrocodone-Acetaminophen] Itching    Objective:  There were no vitals filed for this visit. Constitutional Patient is a pleasant 60 y.o. Caucasian male in NAD. AAO x 3.  Vascular Capillary fill time to digits immediate RLE; delayed LLE. Palpable DP pulse(s) RLE; nonpalpable DP LLE. Palpable PT pulse(s) LLE; nonpalpable LLE. Pedal hair absent. Lower extremity skin temperature gradient within normal limits. No pain with calf compression b/l. No edema noted b/l lower extremities. No cyanosis or clubbing noted.  Neurologic Normal speech. Protective sensation intact 5/5 intact bilaterally with 10g monofilament b/l. Vibratory sensation intact b/l.  Dermatologic Pedal skin with mild atrophy b/l lower extremities. No open wounds b/l lower extremities. No interdigital macerations b/l lower extremities. Toenails 1-5 b/l elongated, discolored, dystrophic, thickened, crumbly with subungual debris and tenderness to dorsal palpation. HKT lesion submet head 1 left foot. Submet head 5 callus right foot not present today.  Orthopedic: Normal muscle  strength 5/5 to all lower extremity muscle groups bilaterally. Patient ambulates independent of any assistive aids. Severe rigid hammertoe deformity noted digits 1-5 b/l.      Assessment:   1. Pain due to onychomycosis of toenail   2. Callus   3. PAD (peripheral artery disease) (HCC)    Plan:  Patient was evaluated and treated and all questions answered. Consent given for treatment as described below: -Examined patient. -Patient to continue soft, supportive shoe gear daily. -Toenails 1-5 b/l were debrided in length and girth with sterile nail nippers and dremel without iatrogenic bleeding.  -Callus(es) submet head 1 left foot pared utilizing sterile scalpel blade without complication or incident. Total number debrided =1. -Patient to report any pedal injuries to medical professional immediately. -Patient/POA to call should there be question/concern in the interim.  Return in about 3 months (around 06/17/2021).  Freddie Breech, DPM

## 2021-04-20 ENCOUNTER — Other Ambulatory Visit: Payer: Self-pay

## 2021-04-20 ENCOUNTER — Encounter: Payer: Self-pay | Admitting: Nurse Practitioner

## 2021-04-20 ENCOUNTER — Ambulatory Visit (INDEPENDENT_AMBULATORY_CARE_PROVIDER_SITE_OTHER): Payer: Medicare Other | Admitting: Nurse Practitioner

## 2021-04-20 VITALS — BP 112/78 | HR 74 | Temp 97.1°F | Ht 67.0 in | Wt 169.0 lb

## 2021-04-20 DIAGNOSIS — G4700 Insomnia, unspecified: Secondary | ICD-10-CM

## 2021-04-20 DIAGNOSIS — Z23 Encounter for immunization: Secondary | ICD-10-CM

## 2021-04-20 DIAGNOSIS — M5136 Other intervertebral disc degeneration, lumbar region: Secondary | ICD-10-CM | POA: Diagnosis not present

## 2021-04-20 DIAGNOSIS — E785 Hyperlipidemia, unspecified: Secondary | ICD-10-CM

## 2021-04-20 DIAGNOSIS — E039 Hypothyroidism, unspecified: Secondary | ICD-10-CM | POA: Diagnosis not present

## 2021-04-20 DIAGNOSIS — G2581 Restless legs syndrome: Secondary | ICD-10-CM | POA: Diagnosis not present

## 2021-04-20 DIAGNOSIS — F325 Major depressive disorder, single episode, in full remission: Secondary | ICD-10-CM

## 2021-04-20 DIAGNOSIS — M109 Gout, unspecified: Secondary | ICD-10-CM | POA: Diagnosis not present

## 2021-04-20 DIAGNOSIS — M51369 Other intervertebral disc degeneration, lumbar region without mention of lumbar back pain or lower extremity pain: Secondary | ICD-10-CM

## 2021-04-20 MED ORDER — TRAZODONE HCL 50 MG PO TABS: 50.0000 mg | ORAL_TABLET | Freq: Every evening | ORAL | 0 refills | Status: DC | PRN

## 2021-04-20 MED ORDER — PRAMIPEXOLE DIHYDROCHLORIDE 0.5 MG PO TABS: 0.5000 mg | ORAL_TABLET | Freq: Every evening | ORAL | 1 refills | Status: DC

## 2021-04-20 MED ORDER — GABAPENTIN 300 MG PO CAPS: 300.0000 mg | ORAL_CAPSULE | Freq: Three times a day (TID) | ORAL | 1 refills | Status: DC

## 2021-04-20 MED ORDER — ALLOPURINOL 300 MG PO TABS: 300.0000 mg | ORAL_TABLET | Freq: Every day | ORAL | 1 refills | Status: DC

## 2021-04-20 MED ORDER — LEVOTHYROXINE SODIUM 75 MCG PO TABS: 75.0000 ug | ORAL_TABLET | Freq: Every day | ORAL | 1 refills | Status: DC

## 2021-04-20 MED ORDER — NAPROXEN 500 MG PO TABS: 500.0000 mg | ORAL_TABLET | Freq: Two times a day (BID) | ORAL | 2 refills | Status: DC

## 2021-04-20 MED ORDER — FLUOXETINE HCL 10 MG PO CAPS: 10.0000 mg | ORAL_CAPSULE | Freq: Every day | ORAL | 0 refills | Status: DC

## 2021-04-20 NOTE — Progress Notes (Signed)
Careteam: Patient Care Team: Lauree Chandler, NP as PCP - General (Geriatric Medicine)  PLACE OF SERVICE:  [place] Directive information Does Patient Have a Medical Advance Directive?: No, Would patient like information on creating a medical advance directive?: No - Patient declined  Allergies  Allergen Reactions   Cephalexin Itching and Swelling   Penicillin G Anaphylaxis   Hydrocodone Itching    Other reaction(s): Itching   Vicodin [Hydrocodone-Acetaminophen] Itching    Chief Complaint  Patient presents with   Follow-up    4 week follow-up. Discuss need for HIV screen, Hep C screen, colonoscopy, shingrix, covid #4, and flu vaccine or exclude if patient refuses.      HPI: Patient is a 60 y.o. male for routine follow up.   Saw urologist due to elevated PSA- has been stable and has not needed to follow up.  Reports he is up to date on colonoscopy   Needs refills on medication.   Overall doing well at this time. No acute complaints today   Review of Systems:  Review of Systems  Constitutional:  Negative for chills, fever and weight loss.  HENT:  Negative for tinnitus.   Respiratory:  Negative for cough, sputum production and shortness of breath.   Cardiovascular:  Negative for chest pain, palpitations and leg swelling.  Gastrointestinal:  Negative for abdominal pain, constipation, diarrhea and heartburn.  Genitourinary:  Negative for dysuria, frequency and urgency.  Musculoskeletal:  Negative for back pain, falls, joint pain and myalgias.  Skin: Negative.   Neurological:  Negative for dizziness and headaches.  Psychiatric/Behavioral:  Negative for depression and memory loss. The patient does not have insomnia.    Past Medical History:  Diagnosis Date   Anxiety    Arthritis    Arthritis    Complication of anesthesia    DDD (degenerative disc disease), lumbar    Depression    GERD (gastroesophageal reflux disease)    occ   Gout    PONV  (postoperative nausea and vomiting)    nausea   Past Surgical History:  Procedure Laterality Date   APPENDECTOMY  93   BACK SURGERY     CHOLECYSTECTOMY  10   HERNIA REPAIR  93   umbilical, groin as child   REVERSE SHOULDER ARTHROPLASTY Left 12/13/2014   Procedure: LEFT REVERSE TOTAL SHOULDER ARTHROPLASTY;  Surgeon: Netta Cedars, MD;  Location: [facility];  Service: Orthopedics;  Laterality: Left;   rotator cuff surgery Left 14   Social History:   reports that he has never smoked. He has never used smokeless tobacco. He reports that he does not drink alcohol and does not use drugs.  Family History  Problem Relation Age of Onset   Hypertension Mother    Cancer Mother 67       lung   Stroke Father    Hypertension Father    Cancer Father 31       brain and spine   Heart disease Father    Arrhythmia Brother        Needs pacer   Stroke Brother    Heart attack Brother    Heart attack Brother    Stroke Brother    Cancer Brother     Medications: Patient's Medications  New Prescriptions   No medications on file  Previous Medications   ALLOPURINOL (ZYLOPRIM) 300 MG TABLET    Take 300 mg by mouth daily.   CHOLECALCIFEROL (VITAMIN D3) 5000 UNITS CAPS    Take  5,000 Units by mouth daily. Take two daily   FLUOXETINE (PROZAC) 10 MG CAPSULE    Take 1 capsule (10 mg total) by mouth daily. For mood control   GABAPENTIN (NEURONTIN) 300 MG CAPSULE    Take 300 mg by mouth 3 (three) times daily.   LEVOTHYROXINE (SYNTHROID, LEVOTHROID) 75 MCG TABLET    Take 75 mcg by mouth daily.   NAPROXEN (NAPROSYN) 500 MG TABLET    Take 500 mg by mouth 2 (two) times daily.   PRAMIPEXOLE (MIRAPEX) 0.5 MG TABLET    Take 0.5 mg by mouth every evening.   TRAZODONE (DESYREL) 50 MG TABLET    Take 1 tablet (50 mg total) by mouth at bedtime as needed for sleep.  Modified Medications   No medications on file  Discontinued Medications   No medications on file    Physical Exam:  Vitals:   04/20/21 1444  BP:  112/78  Pulse: 74  Temp: (!) 97.1 F (36.2 C)  TempSrc: Temporal  SpO2: 97%  Weight: 169 lb (76.7 kg)  Height: '5\' 7"'  (1.702 m)   Body mass index is 26.47 kg/m. Wt Readings from Last 3 Encounters:  04/20/21 169 lb (76.7 kg)  03/18/21 167 lb 9.6 oz (76 kg)  02/16/21 168 lb (76.2 kg)    Physical Exam Constitutional:      General: He is not in acute distress.    Appearance: He is well-developed. He is not diaphoretic.  HENT:     Head: Normocephalic and atraumatic.     Right Ear: External ear normal.     Left Ear: External ear normal.     Mouth/Throat:     Pharynx: No oropharyngeal exudate.  Eyes:     Conjunctiva/sclera: Conjunctivae normal.     Pupils: Pupils are equal, round, and reactive to light.  Cardiovascular:     Rate and Rhythm: Normal rate and regular rhythm.     Heart sounds: Normal heart sounds.  Pulmonary:     Effort: Pulmonary effort is normal.     Breath sounds: Normal breath sounds.  Abdominal:     General: Bowel sounds are normal.     Palpations: Abdomen is soft.  Musculoskeletal:        General: No tenderness.     Cervical back: Normal range of motion and neck supple.     Right lower leg: No edema.     Left lower leg: No edema.  Skin:    General: Skin is warm and dry.  Neurological:     Mental Status: He is alert and oriented to person, place, and time.    Labs reviewed: Basic Metabolic Panel: No results for input(s): NA, K, CL, CO2, GLUCOSE, BUN, CREATININE, CALCIUM, MG, PHOS, TSH in the last 8760 hours. Liver Function Tests: No results for input(s): AST, ALT, ALKPHOS, BILITOT, PROT, ALBUMIN in the last 8760 hours. No results for input(s): LIPASE, AMYLASE in the last 8760 hours. No results for input(s): AMMONIA in the last 8760 hours. CBC: No results for input(s): WBC, NEUTROABS, HGB, HCT, MCV, PLT in the last 8760 hours. Lipid Panel: No results for input(s): CHOL, HDL, LDLCALC, TRIG, CHOLHDL, LDLDIRECT in the last 8760 hours. TSH: No  results for input(s): TSH in the last 8760 hours. A1C: No results found for: HGBA1C   Assessment/Plan 1. Need for influenza vaccination - Flu Vaccine QUAD 6+ mos PF IM (Fluarix Quad PF)  2. Hyperlipidemia, unspecified hyperlipidemia type -has lost weight and change diet, increased physical activity and  cholesterol has been managed this way. Will follow up lipids at this time.  - Lipid Panel  3. Depression, major, in remission ([place]) Controled on prozac - FLUoxetine (PROZAC) 10 MG capsule; Take 1 capsule (10 mg total) by mouth daily. For mood control  Dispense: 30 capsule; Refill: 0  4. Acquired hypothyroidism - TSH - levothyroxine (SYNTHROID) 75 MCG tablet; Take 1 tablet (75 mcg total) by mouth daily.  Dispense: 90 tablet; Refill: 1  5. DDD (degenerative disc disease), lumbar -stable on current regimen - CMP with eGFR([facility]) - CBC with Differential/Platelet - gabapentin (NEURONTIN) 300 MG capsule; Take 1 capsule (300 mg total) by mouth 3 (three) times daily.  Dispense: 90 capsule; Refill: 1 - naproxen (NAPROSYN) 500 MG tablet; Take 1 tablet (500 mg total) by mouth 2 (two) times daily.  Dispense: 60 tablet; Refill: 2  6. Gout, unspecified cause, unspecified chronicity, unspecified site - Uric Acid - allopurinol (ZYLOPRIM) 300 MG tablet; Take 1 tablet (300 mg total) by mouth daily.  Dispense: 90 tablet; Refill: 1  7. Insomnia, unspecified type Controlled on trazodone - traZODone (DESYREL) 50 MG tablet; Take 1 tablet (50 mg total) by mouth at bedtime as needed for sleep.  Dispense: 30 tablet; Refill: 0  8. RLS (restless legs syndrome) -controlled on mirapex - pramipexole (MIRAPEX) 0.5 MG tablet; Take 1 tablet (0.5 mg total) by mouth every evening.  Dispense: 90 tablet; Refill: 1   Next appt: 6 months. Jared Beck. [place], [facility] [phone]

## 2021-04-21 LAB — COMPLETE METABOLIC PANEL WITH GFR
AG Ratio: 1.9 (calc) (ref 1.0–2.5)
ALT: 8 U/L — ABNORMAL LOW (ref 9–46)
AST: 20 U/L (ref 10–35)
Albumin: 4.3 g/dL (ref 3.6–5.1)
Alkaline phosphatase (APISO): 55 U/L (ref 35–144)
BUN: 15 mg/dL (ref 7–25)
CO2: 28 mmol/L (ref 20–32)
Calcium: 9.6 mg/dL (ref 8.6–10.3)
Chloride: 102 mmol/L (ref 98–110)
Creat: 0.95 mg/dL (ref 0.70–1.30)
Globulin: 2.3 g/dL (calc) (ref 1.9–3.7)
Glucose, Bld: 74 mg/dL (ref 65–99)
Potassium: 4.2 mmol/L (ref 3.5–5.3)
Sodium: 138 mmol/L (ref 135–146)
Total Bilirubin: 2.9 mg/dL — ABNORMAL HIGH (ref 0.2–1.2)
Total Protein: 6.6 g/dL (ref 6.1–8.1)
eGFR: 93 mL/min/{1.73_m2} (ref >=60)

## 2021-04-21 LAB — CBC WITH DIFFERENTIAL/PLATELET
Absolute Monocytes: 404 cells/uL (ref 200–950)
Basophils Absolute: 39 cells/uL (ref 0–200)
Basophils Relative: 0.9 %
Eosinophils Absolute: 69 cells/uL (ref 15–500)
Eosinophils Relative: 1.6 %
HCT: 41 % (ref 38.5–50.0)
Hemoglobin: 13.9 g/dL (ref 13.2–17.1)
Lymphs Abs: 1441 cells/uL (ref 850–3900)
MCH: 30 pg (ref 27.0–33.0)
MCHC: 33.9 g/dL (ref 32.0–36.0)
MCV: 88.6 fL (ref 80.0–100.0)
MPV: 10.8 fL (ref 7.5–12.5)
Monocytes Relative: 9.4 %
Neutro Abs: 2348 cells/uL (ref 1500–7800)
Neutrophils Relative %: 54.6 %
Platelets: 229 10*3/uL (ref 140–400)
RBC: 4.63 10*6/uL (ref 4.20–5.80)
RDW: 14.4 % (ref 11.0–15.0)
Total Lymphocyte: 33.5 %
WBC: 4.3 10*3/uL (ref 3.8–10.8)

## 2021-04-21 LAB — LIPID PANEL
Cholesterol: 198 mg/dL (ref <200)
HDL: 43 mg/dL (ref >=40)
LDL Cholesterol (Calc): 135 mg/dL (calc) — ABNORMAL HIGH
Non-HDL Cholesterol (Calc): 155 mg/dL (calc) — ABNORMAL HIGH (ref <130)
Total CHOL/HDL Ratio: 4.6 (calc) (ref <5.0)
Triglycerides: 102 mg/dL (ref <150)

## 2021-04-21 LAB — URIC ACID: Uric Acid, Serum: 3.6 mg/dL — ABNORMAL LOW (ref 4.0–8.0)

## 2021-04-21 LAB — TSH: TSH: 1.08 mIU/L (ref 0.40–4.50)

## 2021-06-23 ENCOUNTER — Ambulatory Visit (INDEPENDENT_AMBULATORY_CARE_PROVIDER_SITE_OTHER): Payer: Self-pay | Admitting: Podiatry

## 2021-06-23 DIAGNOSIS — Z91199 Patient's noncompliance with other medical treatment and regimen due to unspecified reason: Secondary | ICD-10-CM

## 2021-06-28 NOTE — Progress Notes (Signed)
   Complete physical exam  Patient: Jared Beck   DOB: 05/08/1997   60 y.o. Male  MRN: [id number]  Subjective:    No chief complaint on file.   Jared Beck is a 60 y.o. male who presents today for a complete physical exam. She reports consuming a {diet types:17450} diet. {types:19826} She generally feels {DESC; WELL/FAIRLY WELL/POORLY:18703}. She reports sleeping {DESC; WELL/FAIRLY WELL/POORLY:18703}. She {does/does not:200015} have additional problems to discuss today.    Most recent fall risk assessment:    01/13/2022   10:42 AM  Fall Risk   Falls in the past year? 0  Number falls in past yr: 0  Injury with Fall? 0  Risk for fall due to : No Fall Risks  Follow up Falls evaluation completed     Most recent depression screenings:    01/13/2022   10:42 AM 12/04/2020   10:46 AM  PHQ 2/9 Scores  PHQ - 2 Score 0 0  PHQ- 9 Score 5     {VISON DENTAL STD PSA (Optional):27386}  {History (Optional):23778}  Patient Care Team: Jessup, Joy, NP as PCP - General (Nurse Practitioner)   Outpatient Medications Prior to Visit  Medication Sig   fluticasone (FLONASE) 50 MCG/ACT nasal spray Place 2 sprays into both nostrils in the morning and at bedtime. After 7 days, reduce to once daily.   norgestimate-ethinyl estradiol (SPRINTEC 28) 0.25-35 MG-MCG tablet Take 1 tablet by mouth daily.   Nystatin POWD Apply liberally to affected area 2 times per day   spironolactone (ALDACTONE) 100 MG tablet Take 1 tablet (100 mg total) by mouth daily.   No facility-administered medications prior to visit.    ROS        Objective:     There were no vitals taken for this visit. {Vitals History (Optional):23777}  Physical Exam   No results found for any visits on 02/18/22. {Show previous labs (optional):23779}    Assessment & Plan:    Routine Health Maintenance and Physical Exam  Immunization History  Administered Date(s) Administered   DTaP 07/22/1999, 09/17/1999,  11/26/1999, 08/11/2000, 02/25/2004   Hepatitis A 12/22/2007, 12/27/2008   Hepatitis B 05/09/1999, 06/16/1999, 11/26/1999   HiB (PRP-OMP) 07/22/1999, 09/17/1999, 11/26/1999, 08/11/2000   IPV 07/22/1999, 09/17/1999, 05/16/2000, 02/25/2004   Influenza,inj,Quad PF,6+ Mos 03/29/2014   Influenza-Unspecified 06/28/2012   MMR 05/16/2001, 02/25/2004   Meningococcal Polysaccharide 12/27/2011   Pneumococcal Conjugate-13 08/11/2000   Pneumococcal-Unspecified 11/26/1999, 02/09/2000   Tdap 12/27/2011   Varicella 05/16/2000, 12/22/2007    Health Maintenance  Topic Date Due   HIV Screening  Never done   Hepatitis C Screening  Never done   INFLUENZA VACCINE  02/16/2022   PAP-Cervical Cytology Screening  02/18/2022 (Originally 05/07/2020)   PAP SMEAR-Modifier  02/18/2022 (Originally 05/07/2020)   TETANUS/TDAP  02/18/2022 (Originally 12/26/2021)   HPV VACCINES  Discontinued   COVID-19 Vaccine  Discontinued    Discussed health benefits of physical activity, and encouraged her to engage in regular exercise appropriate for her age and condition.  Problem List Items Addressed This Visit   None Visit Diagnoses     Annual physical exam    -  Primary   Cervical cancer screening       Need for Tdap vaccination          No follow-ups on file.     Joy Jessup, NP   

## 2021-07-30 ENCOUNTER — Telehealth: Payer: Self-pay

## 2021-07-30 DIAGNOSIS — F325 Major depressive disorder, single episode, in full remission: Secondary | ICD-10-CM

## 2021-07-30 DIAGNOSIS — M51369 Other intervertebral disc degeneration, lumbar region without mention of lumbar back pain or lower extremity pain: Secondary | ICD-10-CM

## 2021-07-30 DIAGNOSIS — E039 Hypothyroidism, unspecified: Secondary | ICD-10-CM

## 2021-07-30 DIAGNOSIS — M5136 Other intervertebral disc degeneration, lumbar region: Secondary | ICD-10-CM

## 2021-07-30 DIAGNOSIS — G4700 Insomnia, unspecified: Secondary | ICD-10-CM

## 2021-07-30 MED ORDER — TRAZODONE HCL 50 MG PO TABS: 50.0000 mg | ORAL_TABLET | Freq: Every evening | ORAL | 5 refills | Status: AC | PRN

## 2021-07-30 MED ORDER — GABAPENTIN 300 MG PO CAPS: 300.0000 mg | ORAL_CAPSULE | Freq: Three times a day (TID) | ORAL | 5 refills | Status: AC

## 2021-07-30 MED ORDER — NAPROXEN 500 MG PO TABS: 500.0000 mg | ORAL_TABLET | Freq: Two times a day (BID) | ORAL | 5 refills | Status: DC

## 2021-07-30 MED ORDER — LEVOTHYROXINE SODIUM 75 MCG PO TABS: 75.0000 ug | ORAL_TABLET | Freq: Every day | ORAL | 1 refills | Status: AC

## 2021-07-30 MED ORDER — FLUOXETINE HCL 10 MG PO CAPS: 10.0000 mg | ORAL_CAPSULE | Freq: Every day | ORAL | 1 refills | Status: AC

## 2021-07-30 NOTE — Telephone Encounter (Signed)
High risk or very high risk warning populated when attempting to refill medication. RX request sent to PCP for review and approval if warranted.   

## 2021-07-30 NOTE — Telephone Encounter (Signed)
Message left on clinical intake voicemail:   Patient called stating he needs a refill on all of his medications because he has been out for a couple of months due to the pharmacy sending them to the wrong provider.  Please confirm ok to fill all  Last OV was in October 2022, next appointment is due in April 2023

## 2021-07-30 NOTE — Telephone Encounter (Signed)
Yes okay to refill  

## 2021-10-19 ENCOUNTER — Encounter: Payer: Self-pay | Admitting: Nurse Practitioner

## 2021-10-19 ENCOUNTER — Encounter: Payer: Medicare Other | Admitting: Nurse Practitioner

## 2021-10-20 NOTE — Progress Notes (Signed)
This encounter was created in error - please disregard.

## 2021-10-22 ENCOUNTER — Telehealth: Payer: Self-pay

## 2021-10-22 ENCOUNTER — Encounter: Payer: Medicare Other | Admitting: Nurse Practitioner

## 2021-10-22 NOTE — Telephone Encounter (Signed)
I made 3 unsuccessful attempts to contact patient to start AWV. Phone with to voicemail each time.I left message for patient to call office. ? ?1st-3:25 pm ?2nd-3:40 pm ?3rd-3:50 pm ?

## 2021-10-23 ENCOUNTER — Encounter: Payer: Medicare Other | Admitting: Nurse Practitioner

## 2021-11-06 ENCOUNTER — Other Ambulatory Visit: Payer: Self-pay | Admitting: Nurse Practitioner

## 2021-11-06 DIAGNOSIS — G2581 Restless legs syndrome: Secondary | ICD-10-CM

## 2021-12-07 ENCOUNTER — Other Ambulatory Visit: Payer: Self-pay | Admitting: Nurse Practitioner

## 2021-12-07 DIAGNOSIS — M109 Gout, unspecified: Secondary | ICD-10-CM

## 2022-05-12 ENCOUNTER — Other Ambulatory Visit: Payer: Self-pay | Admitting: Nurse Practitioner

## 2022-05-12 DIAGNOSIS — M109 Gout, unspecified: Secondary | ICD-10-CM

## 2022-05-12 NOTE — Telephone Encounter (Addendum)
Called patient, his number is no longer in service. I called his brother Jeneen Rinks and ask him to call us back with Marquelle's new phone #. Once we get in touch with patient, please make him an appointment around 10-20-22. (6 months per Janett Billow) When the appointment is made, prescription can be refilled with 1 refill.(This will last him till April.)

## 2022-07-18 ENCOUNTER — Ambulatory Visit
Admission: EM | Admit: 2022-07-18 | Discharge: 2022-07-18 | Disposition: A | Discharge status: Home / Self Care | Payer: 59 | Attending: Family Medicine | Admitting: Family Medicine

## 2022-07-18 ENCOUNTER — Other Ambulatory Visit: Payer: Self-pay

## 2022-07-18 ENCOUNTER — Encounter: Payer: Self-pay | Admitting: Emergency Medicine

## 2022-07-18 DIAGNOSIS — R509 Fever, unspecified: Secondary | ICD-10-CM | POA: Diagnosis not present

## 2022-07-18 DIAGNOSIS — Z20828 Contact with and (suspected) exposure to other viral communicable diseases: Secondary | ICD-10-CM

## 2022-07-18 DIAGNOSIS — J069 Acute upper respiratory infection, unspecified: Secondary | ICD-10-CM | POA: Diagnosis not present

## 2022-07-18 DIAGNOSIS — M549 Dorsalgia, unspecified: Secondary | ICD-10-CM

## 2022-07-18 MED ORDER — ONDANSETRON 4 MG PO TBDP: 4.0000 mg | ORAL_TABLET | Freq: Three times a day (TID) | ORAL | 0 refills | Status: AC | PRN

## 2022-07-18 MED ORDER — FLUTICASONE PROPIONATE 50 MCG/ACT NA SUSP: 1.0000 | Freq: Two times a day (BID) | NASAL | 2 refills | Status: DC

## 2022-07-18 MED ORDER — BENZONATATE 100 MG PO CAPS: 100.0000 mg | ORAL_CAPSULE | Freq: Three times a day (TID) | ORAL | 0 refills | Status: AC

## 2022-07-18 MED ORDER — FLUTICASONE PROPIONATE 50 MCG/ACT NA SUSP: 1.0000 | Freq: Two times a day (BID) | NASAL | 2 refills | Status: AC

## 2022-07-18 MED ORDER — OSELTAMIVIR PHOSPHATE 75 MG PO CAPS: 75.0000 mg | ORAL_CAPSULE | Freq: Two times a day (BID) | ORAL | 0 refills | Status: DC

## 2022-07-18 MED ORDER — BENZONATATE 100 MG PO CAPS: 100.0000 mg | ORAL_CAPSULE | Freq: Three times a day (TID) | ORAL | 0 refills | Status: DC

## 2022-07-18 MED ORDER — OSELTAMIVIR PHOSPHATE 75 MG PO CAPS: 75.0000 mg | ORAL_CAPSULE | Freq: Two times a day (BID) | ORAL | 0 refills | Status: AC

## 2022-07-18 NOTE — ED Triage Notes (Signed)
Started yesterday with weakness, shaky, cough, cough is non-productive.  Has vomited and has had diarrhea, and chills/hot spells.  Patient has had tylenol.  Patient reports a 100.0 fever.    Church members have had flu

## 2022-07-18 NOTE — Discharge Instructions (Signed)
You may take over-the-counter cough and congestion medications, plain Mucinex, ibuprofen and Tylenol as needed in addition to what I have sent you.

## 2022-07-18 NOTE — ED Triage Notes (Signed)
Left back pain after slipping on ramp friday

## 2022-07-18 NOTE — ED Provider Notes (Signed)
[facility]    CSN: [id number] Arrival date & time: 07/18/22  1503      History   Chief Complaint Chief Complaint  Patient presents with   Weakness    HPI Jared Beck is a 61 y.o. male.   Patient presenting today with 1 day history of fever, chills, body aches, weakness, fatigue, cough, nasal congestion.  Denies chest pain, shortness of breath, abdominal pain.  So far trying Tylenol with mild relief of the fever.  Multiple exposures at church to influenza.  He is also having some left upper back soreness after tripping on a ramp outside of his house and falling backward onto his back 4 days ago.  Denies bruising, shortness of breath, decreased range of motion, radiation of pain.  Has not taken anything for this so far.    Past Medical History:  Diagnosis Date   Anxiety    Arthritis    Arthritis    Complication of anesthesia    DDD (degenerative disc disease), lumbar    Depression    GERD (gastroesophageal reflux disease)    occ   Gout    PONV (postoperative nausea and vomiting)    nausea    Patient Active Problem List   Diagnosis Date Noted   Major depression, recurrent (HCC) 05/09/2018   Wound dehiscence 12/21/2016   S/P lumbar fusion 05/11/2016   Leg pain, bilateral 03/26/2016   Chronic gout 03/12/2016   DDD (degenerative disc disease), lumbar 03/12/2016   Lumbar facet arthropathy 03/12/2016   Radiculopathy, lumbar region 03/12/2016   Rheumatoid arteritis (HCC) 03/12/2016   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   Mixed hyperlipidemia 05/16/2015   Osteoarthritis of spine with radiculopathy, lumbar region 05/16/2015   Restless leg syndrome 05/16/2015   Total bilirubin, elevated 05/16/2015   S/P shoulder replacement 12/13/2014   Presence of artificial shoulder joint 12/13/2014   History of gout 07/26/2014   Primary osteoarthritis of both shoulders 07/26/2014   Osteoarthritis of ankle 03/26/2014   Osteoarthritis, hand 03/26/2014    Complete rupture of rotator cuff 04/27/2013   Pain in joint, shoulder region 04/27/2013   Muscle weakness (generalized) 04/27/2013   Allergy to penicillin 02/13/2013    Past Surgical History:  Procedure Laterality Date   APPENDECTOMY  2293   BACK SURGERY     CHOLECYSTECTOMY  10   HERNIA REPAIR  93   umbilical, groin as child   REVERSE SHOULDER ARTHROPLASTY Left 12/13/2014   Procedure: LEFT REVERSE TOTAL SHOULDER ARTHROPLASTY;  Surgeon: Beverely LowSteve Norris, MD;  Location: [facility];  Service: Orthopedics;  Laterality: Left;   rotator cuff surgery Left 14       Home Medications    Prior to Admission medications   Medication Sig Start Date End Date Taking? Authorizing Provider  ondansetron (ZOFRAN-ODT) 4 MG disintegrating tablet Take 1 tablet (4 mg total) by mouth every 8 (eight) hours as needed for nausea or vomiting. 07/18/22  Yes Particia NearingLane, Deshonna Trnka Elizabeth, PA-C  allopurinol (ZYLOPRIM) 300 MG tablet TAKE 1 TABLET BY MOUTH ONCE DAILY. 05/13/22   Sharon SellerEubanks, Jessica K, NP  benzonatate (TESSALON) 100 MG capsule Take 1 capsule (100 mg total) by mouth every 8 (eight) hours. 07/18/22   Particia NearingLane, Ismerai Bin Elizabeth, PA-C  Cholecalciferol (VITAMIN D3) 5000 units CAPS Take 5,000 Units by mouth daily. Take two daily    [provider]  FLUoxetine (PROZAC) 10 MG capsule Take 1 capsule (10 mg total) by mouth daily. For mood control Patient not taking: Reported on 07/18/2022 07/30/21  Sharon Seller, NP  fluticasone (FLONASE) 50 MCG/ACT nasal spray Place 1 spray into both nostrils 2 (two) times daily. 07/18/22   Particia Nearing, PA-C  gabapentin (NEURONTIN) 300 MG capsule Take 1 capsule (300 mg total) by mouth 3 (three) times daily. 07/30/21   Sharon Seller, NP  levothyroxine (SYNTHROID) 75 MCG tablet Take 1 tablet (75 mcg total) by mouth daily. 07/30/21   Sharon Seller, NP  naproxen (NAPROSYN) 500 MG tablet Take 1 tablet (500 mg total) by mouth 2 (two) times daily. 07/30/21   Sharon Seller, NP  oseltamivir (TAMIFLU) 75 MG capsule Take 1 capsule (75 mg total) by mouth every 12 (twelve) hours. 07/18/22   Particia Nearing, PA-C  pramipexole (MIRAPEX) 0.5 MG tablet TAKE (1) TABLET BY MOUTH AT BEDTIME. Patient not taking: Reported on 07/18/2022 11/06/21   Sharon Seller, NP  traZODone (DESYREL) 50 MG tablet Take 1 tablet (50 mg total) by mouth at bedtime as needed for sleep. 07/30/21   Sharon Seller, NP    Family History Family History  Problem Relation Age of Onset   Hypertension Mother    Cancer Mother 66       lung   Stroke Father    Hypertension Father    Cancer Father 48       brain and spine   Heart disease Father    Arrhythmia Brother        Needs pacer   Stroke Brother    Heart attack Brother    Heart attack Brother    Stroke Brother    Cancer Brother     Social History Social History   Tobacco Use   Smoking status: Never   Smokeless tobacco: Never  Vaping Use   Vaping Use: Never used  Substance Use Topics   Alcohol use: Never   Drug use: Never     Allergies   Cephalexin, Penicillin g, Hydrocodone, and Vicodin [hydrocodone-acetaminophen]   Review of Systems Review of Systems Per HPI  Physical Exam Triage Vital Signs ED Triage Vitals  Enc Vitals Group     BP 07/18/22 1515 126/78     Pulse Rate 07/18/22 1515 76     Resp 07/18/22 1515 (!) 24     Temp 07/18/22 1515 (!) 97.4 F (36.3 C)     Temp Source 07/18/22 1515 Oral     SpO2 07/18/22 1515 98 %     Weight --      Height --      Head Circumference --      Peak Flow --      Pain Score 07/18/22 1518 7     Pain Loc --      Pain Edu? --      Excl. in GC? --    No data found.  Updated Vital Signs BP 126/78 (BP Location: Right Arm)   Pulse 76   Temp (!) 97.4 F (36.3 C) (Oral)   Resp (!) 24   SpO2 98%   Visual Acuity Right Eye Distance:   Left Eye Distance:   Bilateral Distance:    Right Eye Near:   Left Eye Near:    Bilateral Near:     Physical  Exam Vitals and nursing note reviewed.  Constitutional:      Appearance: He is well-developed.  HENT:     Head: Atraumatic.     Right Ear: External ear normal.     Left Ear: External ear normal.  Mouth/Throat:     Pharynx: No oropharyngeal exudate.  Eyes:     Conjunctiva/sclera: Conjunctivae normal.     Pupils: Pupils are equal, round, and reactive to light.  Cardiovascular:     Rate and Rhythm: Normal rate and regular rhythm.  Pulmonary:     Effort: Pulmonary effort is normal. No respiratory distress.     Breath sounds: No wheezing or rales.  Musculoskeletal:        General: Tenderness present. No swelling or deformity. Normal range of motion.     Cervical back: Normal range of motion and neck supple.     Comments: Mild to moderate tenderness to palpation diffusely to the left upper back in the area of impact from the fall.  No bony deformity palpable, chest rise symmetric bilaterally, no bruising or swelling  Lymphadenopathy:     Cervical: No cervical adenopathy.  Skin:    General: Skin is warm and dry.     Findings: No bruising.  Neurological:     Mental Status: He is alert and oriented to person, place, and time.  Psychiatric:        Behavior: Behavior normal.      UC Treatments / Results  Labs (all labs ordered are listed, but only abnormal results are displayed) Labs Reviewed - No data to display  EKG   Radiology No results found.  Procedures Procedures (including critical care time)  Medications Ordered in UC Medications - No data to display  Initial Impression / Assessment and Plan / UC Course  I have reviewed the triage vital signs and the nursing notes.  Pertinent labs & imaging results that were available during my care of the patient were reviewed by me and considered in my medical decision making (see chart for details).     Consistent with influenza based on symptoms and exposures.  Treat with Tamiflu, Tessalon, Flonase, Zofran for nausea  and vomiting.  No evidence of bony injury to the back from the fall so x-ray imaging deferred with shared decision making.  Discussed over-the-counter pain relievers, heat, massage, lidocaine patches as needed.  Return for worsening symptoms.  Final Clinical Impressions(s) / UC Diagnoses   Final diagnoses:  Viral URI with cough  Fever, unspecified  Upper back pain on left side  Exposure to influenza     Discharge Instructions      You may take over-the-counter cough and congestion medications, plain Mucinex, ibuprofen and Tylenol as needed in addition to what I have sent you.    ED Prescriptions     Medication Sig Dispense Auth. Provider   oseltamivir (TAMIFLU) 75 MG capsule  (Status: Discontinued) Take 1 capsule (75 mg total) by mouth every 12 (twelve) hours. 10 capsule Particia Nearing, PA-C   benzonatate (TESSALON) 100 MG capsule  (Status: Discontinued) Take 1 capsule (100 mg total) by mouth every 8 (eight) hours. 21 capsule Particia Nearing, PA-C   fluticasone [facility]) 50 MCG/ACT nasal spray  (Status: Discontinued) Place 1 spray into both nostrils 2 (two) times daily. 16 g Particia Nearing, [place]   oseltamivir (TAMIFLU) 75 MG capsule Take 1 capsule (75 mg total) by mouth every 12 (twelve) hours. 10 capsule Particia Nearing, PA-C   fluticasone [facility]) 50 MCG/ACT nasal spray Place 1 spray into both nostrils 2 (two) times daily. 16 g Particia Nearing, PA-C   benzonatate (TESSALON) 100 MG capsule Take 1 capsule (100 mg total) by mouth every 8 (eight) hours. 21 capsule Particia Nearing, [place]  ondansetron (ZOFRAN-ODT) 4 MG disintegrating tablet Take 1 tablet (4 mg total) by mouth every 8 (eight) hours as needed for nausea or vomiting. 20 tablet Particia Nearing, [place]      PDMP not reviewed this encounter.   Particia Nearing, [place] 07/18/22 1605

## 2022-10-11 ENCOUNTER — Other Ambulatory Visit: Payer: Self-pay | Admitting: Nurse Practitioner

## 2022-10-11 DIAGNOSIS — M5136 Other intervertebral disc degeneration, lumbar region: Secondary | ICD-10-CM

## 2022-10-11 NOTE — Telephone Encounter (Signed)
Pended Rx and sent to [facility] for approval.  Patient needs an appointment before anymore Future refills.

## 2022-10-27 ENCOUNTER — Emergency Department (HOSPITAL_COMMUNITY)
Admission: EM | Admit: 2022-10-27 | Discharge: 2022-10-27 | Disposition: A | Discharge status: Home / Self Care | Payer: 59 | Attending: Emergency Medicine | Admitting: Emergency Medicine

## 2022-10-27 ENCOUNTER — Other Ambulatory Visit: Payer: Self-pay

## 2022-10-27 ENCOUNTER — Emergency Department (HOSPITAL_COMMUNITY): Payer: 59

## 2022-10-27 ENCOUNTER — Ambulatory Visit
Admission: EM | Admit: 2022-10-27 | Discharge: 2022-10-27 | Disposition: A | Discharge status: Home / Self Care | Payer: 59 | Attending: Family Medicine | Admitting: Family Medicine

## 2022-10-27 DIAGNOSIS — R1030 Lower abdominal pain, unspecified: Secondary | ICD-10-CM

## 2022-10-27 DIAGNOSIS — R110 Nausea: Secondary | ICD-10-CM | POA: Diagnosis not present

## 2022-10-27 DIAGNOSIS — R1013 Epigastric pain: Secondary | ICD-10-CM | POA: Diagnosis present

## 2022-10-27 DIAGNOSIS — R1084 Generalized abdominal pain: Secondary | ICD-10-CM | POA: Insufficient documentation

## 2022-10-27 DIAGNOSIS — R102 Pelvic and perineal pain: Secondary | ICD-10-CM

## 2022-10-27 DIAGNOSIS — Z8719 Personal history of other diseases of the digestive system: Secondary | ICD-10-CM

## 2022-10-27 LAB — POCT FASTING CBG KUC MANUAL ENTRY: POCT Glucose (KUC): 81 mg/dL (ref 70–99)

## 2022-10-27 LAB — COMPREHENSIVE METABOLIC PANEL
ALT: 27 U/L (ref 0–44)
AST: 42 U/L — ABNORMAL HIGH (ref 15–41)
Albumin: 4.3 g/dL (ref 3.5–5.0)
Alkaline Phosphatase: 81 U/L (ref 38–126)
Anion gap: 10 (ref 5–15)
BUN: 17 mg/dL (ref 6–20)
CO2: 26 mmol/L (ref 22–32)
Calcium: 9.6 mg/dL (ref 8.9–10.3)
Chloride: 99 mmol/L (ref 98–111)
Creatinine, Ser: 1 mg/dL (ref 0.61–1.24)
GFR, Estimated: >60 mL/min (ref >60)
Glucose, Bld: 89 mg/dL (ref 70–99)
Potassium: 3.9 mmol/L (ref 3.5–5.1)
Sodium: 135 mmol/L (ref 135–145)
Total Bilirubin: 4.2 mg/dL — ABNORMAL HIGH (ref 0.3–1.2)
Total Protein: 7.6 g/dL (ref 6.5–8.1)

## 2022-10-27 LAB — POCT URINALYSIS DIP (MANUAL ENTRY)
Blood, UA: NEGATIVE
Glucose, UA: NEGATIVE mg/dL
Ketones, POC UA: NEGATIVE mg/dL
Leukocytes, UA: NEGATIVE
Nitrite, UA: NEGATIVE
Protein Ur, POC: NEGATIVE mg/dL
Spec Grav, UA: >=1.03 — AB (ref 1.010–1.025)
Urobilinogen, UA: 0.2 E.U./dL
pH, UA: 5.5 (ref 5.0–8.0)

## 2022-10-27 LAB — CBC WITH DIFFERENTIAL/PLATELET
Abs Immature Granulocytes: 0.01 10*3/uL (ref 0.00–0.07)
Basophils Absolute: 0.1 10*3/uL (ref 0.0–0.1)
Basophils Relative: 1 %
Eosinophils Absolute: 0.1 10*3/uL (ref 0.0–0.5)
Eosinophils Relative: 2 %
HCT: 43.5 % (ref 39.0–52.0)
Hemoglobin: 14.9 g/dL (ref 13.0–17.0)
Immature Granulocytes: 0 %
Lymphocytes Relative: 42 %
Lymphs Abs: 2.1 10*3/uL (ref 0.7–4.0)
MCH: 29.4 pg (ref 26.0–34.0)
MCHC: 34.3 g/dL (ref 30.0–36.0)
MCV: 86 fL (ref 80.0–100.0)
Monocytes Absolute: 0.5 10*3/uL (ref 0.1–1.0)
Monocytes Relative: 10 %
Neutro Abs: 2.2 10*3/uL (ref 1.7–7.7)
Neutrophils Relative %: 45 %
Platelets: 257 10*3/uL (ref 150–400)
RBC: 5.06 MIL/uL (ref 4.22–5.81)
RDW: 12.8 % (ref 11.5–15.5)
WBC: 4.9 10*3/uL (ref 4.0–10.5)
nRBC: 0 % (ref 0.0–0.2)

## 2022-10-27 LAB — URINALYSIS, ROUTINE W REFLEX MICROSCOPIC
Bilirubin Urine: NEGATIVE
Glucose, UA: NEGATIVE mg/dL
Hgb urine dipstick: NEGATIVE
Ketones, ur: NEGATIVE mg/dL
Leukocytes,Ua: NEGATIVE
Nitrite: NEGATIVE
Protein, ur: NEGATIVE mg/dL
Specific Gravity, Urine: 1.019 (ref 1.005–1.030)
pH: 5 (ref 5.0–8.0)

## 2022-10-27 LAB — LIPASE, BLOOD: Lipase: 44 U/L (ref 11–51)

## 2022-10-27 MED ORDER — ONDANSETRON HCL 4 MG/2ML IJ SOLN
4.0000 mg | Freq: Once | INTRAMUSCULAR | Status: AC
  Administered 2022-10-27: 4 mg via INTRAVENOUS
  Filled 2022-10-27: qty 2

## 2022-10-27 MED ORDER — TAMSULOSIN HCL 0.4 MG PO CAPS: 0.4000 mg | ORAL_CAPSULE | Freq: Every day | ORAL | 0 refills | Status: AC

## 2022-10-27 MED ORDER — CIPROFLOXACIN HCL 500 MG PO TABS: 500.0000 mg | ORAL_TABLET | Freq: Two times a day (BID) | ORAL | 0 refills | Status: AC

## 2022-10-27 MED ORDER — IOHEXOL 300 MG/ML  SOLN
100.0000 mL | Freq: Once | INTRAMUSCULAR | Status: AC | PRN
  Administered 2022-10-27: 100 mL via INTRAVENOUS

## 2022-10-27 MED ORDER — SODIUM CHLORIDE 0.9 % IV SOLN: INTRAVENOUS | Status: DC

## 2022-10-27 NOTE — ED Triage Notes (Signed)
Pt reports lower sharp abdominal pain x 1 day. Pt says the pain makes him dizzy. Could not drive himself here today due to pain and dizziness.   Pt denies any urinary sxs or any injury. States he woke up yesterday morning like this.

## 2022-10-27 NOTE — Discharge Instructions (Signed)
As discussed, today's evaluation has been most consistent with pain from an enlarged or inflamed prostate.  Please be sure to follow-up with your urologist.  You are starting medication that should assist with pain control.  In addition, today CT scan showed items that require follow-up with your primary care physician.  Please take this paperwork, with the included interpretation of the CT scan to your next primary care visit.  PG:[id number]:  1. No acute abnormality.  2. 2.2 cm soft tissue mass abutting the tail of the pancreas,  located between the stomach, spleen and upper pole of the left  kidney. This has a density similar to the spleen on all sequences  and is most likely a splenule. This could be confirmed with an  elective nuclear medicine technetium 29m sulfur colloid scan.  3. Colonic diverticulosis.  4. Moderately enlarged and heterogeneous prostate gland.  5. Mild diffuse bladder wall thickening, most likely due to chronic  bladder outlet obstruction by the moderately enlarged prostate  gland.  6. Calcific coronary artery and aortic atherosclerosis.   Aortic Atherosclerosis (ICD10-I70.0).

## 2022-10-27 NOTE — ED Provider Notes (Signed)
[place] EMERGENCY DEPARTMENT AT [facility] Provider Note   CSN: [id number] Arrival date & time: 10/27/22  1227     History  Chief Complaint  Patient presents with   Abdominal Pain    Jared Beck is a 62 y.o. male.  HPI Patient presents from urgent care where he initially went due to abdominal pain, nausea. Pain is in the lower abdomen, though he also states he has some in the midline upper abdomen. Onset was yesterday.  Since that time he had nausea, no actual vomiting, or diarrhea. No fever. No chest pain, dyspnea. He has a history of prior abdominal surgery including hernia repair.     Home Medications Prior to Admission medications   Medication Sig Start Date End Date Taking? Authorizing Provider  ciprofloxacin (CIPRO) 500 MG tablet Take 1 tablet (500 mg total) by mouth every 12 (twelve) hours. 10/27/22  Yes Gerhard Munch, MD  tamsulosin (FLOMAX) 0.4 MG CAPS capsule Take 1 capsule (0.4 mg total) by mouth daily. 10/27/22  Yes Gerhard Munch, MD  allopurinol (ZYLOPRIM) 300 MG tablet TAKE 1 TABLET BY MOUTH ONCE DAILY. 05/13/22   Sharon Seller, NP  benzonatate (TESSALON) 100 MG capsule Take 1 capsule (100 mg total) by mouth every 8 (eight) hours. 07/18/22   Particia Nearing, PA-C  Cholecalciferol (VITAMIN D3) 5000 units CAPS Take 5,000 Units by mouth daily. Take two daily    [provider]  FLUoxetine (PROZAC) 10 MG capsule Take 1 capsule (10 mg total) by mouth daily. For mood control Patient not taking: Reported on 07/18/2022 07/30/21   Sharon Seller, NP  fluticasone [facility]) 50 MCG/ACT nasal spray Place 1 spray into both nostrils 2 (two) times daily. 07/18/22   Particia Nearing, PA-C  gabapentin (NEURONTIN) 300 MG capsule Take 1 capsule (300 mg total) by mouth 3 (three) times daily. 07/30/21   Sharon Seller, NP  levothyroxine (SYNTHROID) 75 MCG tablet Take 1 tablet (75 mcg total) by mouth daily. 07/30/21   Sharon Seller, NP  naproxen (NAPROSYN) 500 MG tablet Take 1 tablet (500 mg total) by mouth 2 (two) times daily. 07/30/21   Sharon Seller, NP  ondansetron (ZOFRAN-ODT) 4 MG disintegrating tablet Take 1 tablet (4 mg total) by mouth every 8 (eight) hours as needed for nausea or vomiting. 07/18/22   Particia Nearing, PA-C  oseltamivir (TAMIFLU) 75 MG capsule Take 1 capsule (75 mg total) by mouth every 12 (twelve) hours. 07/18/22   Particia Nearing, PA-C  pramipexole (MIRAPEX) 0.5 MG tablet TAKE (1) TABLET BY MOUTH AT BEDTIME. Patient not taking: Reported on 07/18/2022 11/06/21   Sharon Seller, NP  traZODone (DESYREL) 50 MG tablet Take 1 tablet (50 mg total) by mouth at bedtime as needed for sleep. 07/30/21   Sharon Seller, NP      Allergies    Cephalexin, Penicillin g, Hydrocodone, and Vicodin [hydrocodone-acetaminophen]    Review of Systems   Review of Systems  All other systems reviewed and are negative.   Physical Exam Updated Vital Signs BP (!) 144/88   Pulse 80   Temp 97.7 F (36.5 C) (Oral)   Resp 18   Wt 76.7 kg   SpO2 100%   BMI 26.48 kg/m  Physical Exam Vitals and nursing note reviewed.  Constitutional:      General: He is not in acute distress.    Appearance: He is well-developed.  HENT:     Head: Normocephalic and atraumatic.  Eyes:     Conjunctiva/sclera: Conjunctivae normal.  Cardiovascular:     Rate and Rhythm: Normal rate and regular rhythm.  Pulmonary:     Effort: Pulmonary effort is normal. No respiratory distress.     Breath sounds: No stridor.  Abdominal:     General: There is no distension.     Tenderness: There is generalized abdominal tenderness and tenderness in the epigastric area and suprapubic area.  Skin:    General: Skin is warm and dry.  Neurological:     Mental Status: He is alert and oriented to person, place, and time.     ED Results / Procedures / Treatments   Labs (all labs ordered are listed, but only abnormal results  are displayed) Labs Reviewed  COMPREHENSIVE METABOLIC PANEL - Abnormal; Notable for the following components:      Result Value   AST 42 (*)    Total Bilirubin 4.2 (*)    All other components within normal limits  LIPASE, BLOOD  CBC WITH DIFFERENTIAL/PLATELET  URINALYSIS, ROUTINE W REFLEX MICROSCOPIC    EKG None  Radiology CT ABDOMEN PELVIS W CONTRAST  Result Date: 10/27/2022 CLINICAL DATA:  Acute, non localized lower abdominal pain for 1 day. EXAM: CT ABDOMEN AND PELVIS WITH CONTRAST TECHNIQUE: Multidetector CT imaging of the abdomen and pelvis was performed using the standard protocol following bolus administration of intravenous contrast. RADIATION DOSE REDUCTION: This exam was performed according to the departmental dose-optimization program which includes automated exposure control, adjustment of the mA and/or kV according to patient size and/or use of iterative reconstruction technique. CONTRAST:  100mL OMNIPAQUE IOHEXOL 300 MG/ML  SOLN COMPARISON:  None Available. FINDINGS: Lower chest: Atheromatous calcifications, including the coronary arteries and aorta. Hepatobiliary: Cholecystectomy clips. Intrahepatic and extrahepatic biliary ductal dilatation. The common duct measures 1.7 cm in maximum diameter. This tapers in the pancreatic head with no visible obstructing stone or mass. Pancreas: Unremarkable. No pancreatic ductal dilatation or surrounding inflammatory changes. Oval soft tissue mass abutting the tail of the pancreas, located between the stomach, spleen and upper pole of the left kidney. This has a density similar to the spleen on all the sequences and measures 2.2 x 1.7 cm on image number 16/2. No similar finding described on the previous report. Spleen: Normal in size without focal abnormality. Adrenals/Urinary Tract: Mild diffuse bladder wall thickening unremarkable adrenal glands, kidneys and ureters. Stomach/Bowel: Multiple colonic diverticula without evidence of diverticulitis.  Surgically absent appendix. Unremarkable stomach and small bowel. Vascular/Lymphatic: No significant vascular findings are present. No enlarged abdominal or pelvic lymph nodes. Reproductive: Moderately enlarged and heterogeneous prostate gland. Other: No abdominal wall hernia or abnormality. No abdominopelvic ascites. Musculoskeletal: Pedicle screw and rod fixation from the T10 to the S1 level. Solid bone fusion at multiple levels. Kyphoplasty material in the T9 and T10 vertebral bodies. Degenerative changes throughout the lumbar and lower thoracic spine. Minimal right hip degenerative changes. IMPRESSION: 1. No acute abnormality. 2. 2.2 cm soft tissue mass abutting the tail of the pancreas, located between the stomach, spleen and upper pole of the left kidney. 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 This could be confirmed with an elective nuclear medicine technetium 5827m sulfur colloid scan. 3. Colonic diverticulosis. 4. Moderately enlarged and heterogeneous prostate gland. 5. Mild diffuse bladder wall thickening, most likely due to chronic bladder outlet obstruction by the moderately enlarged prostate gland. 6. Calcific coronary artery and aortic atherosclerosis. Aortic Atherosclerosis (ICD10-I70.0). Electronically  Signed   By: Beckie Salts M.D.   On: 10/27/2022 14:41    Procedures Procedures    Medications Ordered in ED Medications  0.9 %  sodium chloride infusion ( Intravenous New Bag/Given 10/27/22 1319)  ondansetron (ZOFRAN) injection 4 mg (4 mg Intravenous Given 10/27/22 1319)  iohexol (OMNIPAQUE) 300 MG/ML solution 100 mL (100 mLs Intravenous Contrast Given 10/27/22 1410)    ED Course/ Medical Decision Making/ A&P                             Medical Decision Making Adult male with history of multiple abdominal surgery presents with abdominal pain, nausea.  Differential includes scar tissue, obstruction, infection, less likely G you as the  patient has no urinary complaints, nor flank pain. Patient is afebrile, not hypotensive, little early evidence for bacteremia, sepsis. On cardiac monitor the patient is sinus rhythm, 80 normal Pulse ox 99% room air normal CT, labs, fluids ordered.  Amount and/or Complexity of Data Reviewed External Data Reviewed: notes. Labs: ordered. Radiology: ordered and independent interpretation performed. Decision-making details documented in ED Course. ECG/medicine tests: ordered and independent interpretation performed. Decision-making details documented in ED Course.  Risk Prescription drug management. Decision regarding hospitalization.   3:28 PM On repeat exam the patient is awake, alert,.  Hemodynamically unremarkable.  I discussed the CT which I reviewed, labs, urinalysis.  Some suspicion for prostatitis given his substantial pain with palpation, though prostatomegaly would be another possibility. No evidence for obstruction, bacteremia, sepsis.  No evidence for hernia complication. Patient will follow-up with urology after initiation of Flomax, Cipro.        Final Clinical Impression(s) / ED Diagnoses Final diagnoses:  Abdominal pain, suprapubic    Rx / DC Orders ED Discharge Orders          Ordered    ciprofloxacin (CIPRO) 500 MG tablet  Every 12 hours        10/27/22 1528    tamsulosin (FLOMAX) 0.4 MG CAPS capsule  Daily        10/27/22 1528              Gerhard Munch, MD 10/27/22 1528

## 2022-10-27 NOTE — ED Triage Notes (Signed)
Lower abd pain x 1 day. Denies urinary symptoms. Denies n/v/d. Pt states hx of hernias and feels similar but last hernia repair was 2010. Pt seen at [facility] today and advised to come to ER for evaluation.
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
## 2022-10-27 NOTE — ED Notes (Signed)
Pt verbalizes understanding that he has been discharge from UC to go to [facility] ER.

## 2022-10-27 NOTE — ED Notes (Signed)
Patient is being discharged from the Urgent Care and sent to the Emergency Department via personal vehicle . Per Mardella Layman, patient is in need of higher level of care due to abdominal pain. Patient is aware and verbalizes understanding of plan of care.  Vitals:   10/27/22 1130  BP: 96/69  Pulse: 81  Resp: 18  Temp: 97.9 F (36.6 C)  SpO2: 97%

## 2022-11-02 ENCOUNTER — Ambulatory Visit (INDEPENDENT_AMBULATORY_CARE_PROVIDER_SITE_OTHER): Payer: 59 | Admitting: Urology

## 2022-11-02 ENCOUNTER — Encounter: Payer: Self-pay | Admitting: Urology

## 2022-11-02 VITALS — BP 128/83 | HR 71

## 2022-11-02 DIAGNOSIS — N4289 Other specified disorders of prostate: Secondary | ICD-10-CM

## 2022-11-02 DIAGNOSIS — R109 Unspecified abdominal pain: Secondary | ICD-10-CM

## 2022-11-02 DIAGNOSIS — R351 Nocturia: Secondary | ICD-10-CM

## 2022-11-02 DIAGNOSIS — N401 Enlarged prostate with lower urinary tract symptoms: Secondary | ICD-10-CM | POA: Diagnosis not present

## 2022-11-02 DIAGNOSIS — R972 Elevated prostate specific antigen [PSA]: Secondary | ICD-10-CM | POA: Diagnosis not present

## 2022-11-02 LAB — URINALYSIS, ROUTINE W REFLEX MICROSCOPIC
Bilirubin, UA: NEGATIVE
Glucose, UA: NEGATIVE
Ketones, UA: NEGATIVE
Leukocytes,UA: NEGATIVE
Nitrite, UA: NEGATIVE
Protein,UA: NEGATIVE
RBC, UA: NEGATIVE
Specific Gravity, UA: 1.025 (ref 1.005–1.030)
Urobilinogen, Ur: 0.2 mg/dL (ref 0.2–1.0)
pH, UA: 5.5 (ref 5.0–7.5)

## 2022-11-02 NOTE — Progress Notes (Signed)
H&P  Chief Complaint: Abdominal pain  History of Present Illness: 62 year old male with history of prior ultrasound and biopsy of the prostate by Dr. Berneice Heinrich in 2021 presents at this time for follow-up of recent emergency room visit a week ago.  He had abdominal pain, in the periumbilical area for about a day before presentation to the emergency room last Wednesday.  No associated nausea, vomiting, change in lower urinary tract pattern.  No lateralizing flank pain.  CT revealed a large prostate with thick-walled bladder.  He has not had gross hematuria.  His urinalysis at that time was normal.  Prostate volume at the time of his prostate biopsy in 2021 was 40 mL.  Current IPSS 5, quality-of-life score 1.  He is on tamsulosin.  Still having some pain in the periumbilical area, no real change in urinary pattern.  At the time of his prostate biopsy his PSA was 4.1.  There is a family history of prostate cancer.  He has not been followed for his PSA or prostate since that time.  Recent CT findings:  IMPRESSION: 1. No acute abnormality. 2. 2.2 cm soft tissue mass abutting the tail of the pancreas, located between the stomach, spleen and upper pole of the left kidney. 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 This could be confirmed with an elective nuclear medicine technetium 58m sulfur colloid scan. 3. Colonic diverticulosis. 4. Moderately enlarged and heterogeneous prostate gland. 5. Mild diffuse bladder wall thickening, most likely due to chronic bladder outlet obstruction by the moderately enlarged prostate gland. 6. Calcific coronary artery and aortic atherosclerosis.   Aortic Atherosclerosis (ICD10-I70.0).    Past Medical History:  Diagnosis Date   Anxiety    Arthritis    Arthritis    Complication of anesthesia    DDD (degenerative disc disease), lumbar    Depression    GERD (gastroesophageal reflux disease)    occ   Gout     PONV (postoperative nausea and vomiting)    nausea    Past Surgical History:  Procedure Laterality Date   APPENDECTOMY  58   BACK SURGERY     CHOLECYSTECTOMY  12   HERNIA REPAIR  89   umbilical, groin as child   REVERSE SHOULDER ARTHROPLASTY Left 12/13/2014   Procedure: LEFT REVERSE TOTAL SHOULDER ARTHROPLASTY;  Surgeon: Beverely Low, MD;  Location: [facility] OR;  Service: Orthopedics;  Laterality: Left;   rotator cuff surgery Left 16    Home Medications:  Allergies as of 11/02/2022       Reactions   Cephalexin Itching, Swelling   Penicillin G Anaphylaxis   Hydrocodone Itching   Other reaction(s): Itching   Vicodin [hydrocodone-acetaminophen] Itching        Medication List        Accurate as of November 02, 2022  3:16 PM. If you have any questions, ask your nurse or doctor.          allopurinol 300 MG tablet Commonly known as: ZYLOPRIM TAKE 1 TABLET BY MOUTH ONCE DAILY.   atorvastatin 20 MG tablet Commonly known as: LIPITOR Take 20 mg by mouth daily.   benzonatate 100 MG capsule Commonly known as: TESSALON Take 1 capsule (100 mg total) by mouth every 8 (eight) hours.   ciprofloxacin 500 MG tablet Commonly known as: CIPRO Take 1 tablet (500 mg total) by mouth every 12 (twelve) hours.   FLUoxetine 10 MG capsule Commonly known as: PROZAC Take 1 capsule (  10 mg total) by mouth daily. For mood control   fluticasone 50 MCG/ACT nasal spray Commonly known as: FLONASE Place 1 spray into both nostrils 2 (two) times daily.   gabapentin 300 MG capsule Commonly known as: NEURONTIN Take 1 capsule (300 mg total) by mouth 3 (three) times daily.   levothyroxine 75 MCG tablet Commonly known as: SYNTHROID Take 1 tablet (75 mcg total) by mouth daily.   naproxen 500 MG tablet Commonly known as: NAPROSYN Take 1 tablet (500 mg total) by mouth 2 (two) times daily.   ondansetron 4 MG disintegrating tablet Commonly known as: ZOFRAN-ODT Take 1 tablet (4 mg total) by mouth every 8  (eight) hours as needed for nausea or vomiting.   oseltamivir 75 MG capsule Commonly known as: TAMIFLU Take 1 capsule (75 mg total) by mouth every 12 (twelve) hours.   pramipexole 0.5 MG tablet Commonly known as: MIRAPEX TAKE (1) TABLET BY MOUTH AT BEDTIME.   tamsulosin 0.4 MG Caps capsule Commonly known as: FLOMAX Take 1 capsule (0.4 mg total) by mouth daily.   traZODone 50 MG tablet Commonly known as: DESYREL Take 1 tablet (50 mg total) by mouth at bedtime as needed for sleep.   Vitamin D3 125 MCG (5000 UT) Caps Take 5,000 Units by mouth daily. Take two daily   Vitamin D3 50 MCG Caps Take by mouth.        Allergies:  Allergies  Allergen Reactions   Cephalexin Itching and Swelling   Penicillin G Anaphylaxis   Hydrocodone Itching    Other reaction(s): Itching   Vicodin [Hydrocodone-Acetaminophen] Itching    Family History  Problem Relation Age of Onset   Hypertension Mother    Cancer Mother 55       lung   Stroke Father    Hypertension Father    Cancer Father 28       brain and spine   Heart disease Father    Arrhythmia Brother        Needs pacer   Stroke Brother    Heart attack Brother    Heart attack Brother    Stroke Brother    Cancer Brother     Social History:  reports that he has never smoked. He has never used smokeless tobacco. He reports that he does not drink alcohol and does not use drugs.  ROS: A complete review of systems was performed.  All systems are negative except for pertinent findings as noted.  Physical Exam:  Vital signs in last 24 hours: BP 128/83   Pulse 71  Constitutional:  Alert and oriented, No acute distress Cardiovascular: Regular rate  Respiratory: Normal respiratory effort GI: Abdomen is soft, nontender, nondistended, no abdominal masses. No CVAT.  There are no midline or inguinal hernias. Genitourinary: Normal male phallus, testes are descended bilaterally and non-tender and without masses, scrotum is normal in  appearance without lesions or masses, perineum is normal on inspection.  Normal anal sphincter tone.  Prostate 50 g, symmetric, nonnodular nontender. Lymphatic: No lymphadenopathy Neurologic: Grossly intact, no focal deficits Psychiatric: Normal mood and affect  I have reviewed prior pt notes  I have reviewed notes from referring/previous physicians  I have reviewed urinalysis results  I have independently reviewed prior imaging--prostate ultrasound and CT images reviewed.  I have reviewed prior PSA results     Impression/Assessment:  1.  Abdominal pain.  I do not think that this is urologic in nature  2.  History of elevated PSA, biopsy was negative in  2021, inadequate follow-up since that time  3.  BPH, symptoms fairly good on tamsulosin  Plan:  1.  I will check PSA today  2.  I reassured the patient that I do not think he has significant urologic causes for his abdominal pain  3.  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  I will have him come back here for recheck in 6 months if PSA normal

## 2022-11-03 LAB — PSA: Prostate Specific Ag, Serum: 3.2 ng/mL (ref 0.0–4.0)

## 2022-11-10 ENCOUNTER — Telehealth: Payer: Self-pay

## 2022-11-10 NOTE — Telephone Encounter (Signed)
Tried calling patient with no answer, left voiced message for return call.

## 2022-11-10 NOTE — Telephone Encounter (Signed)
Patient is aware that his PSA is 3.2 lower than at the time of biopsy, good new. Patient voiced understanding

## 2022-11-10 NOTE — Telephone Encounter (Signed)
-----   Message from Marcine Matar, MD sent at 11/09/2022  8:32 AM EDT ----- Notify patient that his PSA is 3.2, lower than at the time of biopsy, good news. ----- Message ----- From: Troy Sine, CMA Sent: 11/08/2022   8:21 AM EDT To: Marcine Matar, MD  Please review

## 2022-11-10 NOTE — Telephone Encounter (Signed)
-----   Message from Stephen Dahlstedt, MD sent at 11/09/2022  8:32 AM EDT ----- Notify patient that his PSA is 3.2, lower than at the time of biopsy, good news. ----- Message ----- From: Kaelynn Igo R, CMA Sent: 11/08/2022   8:21 AM EDT To: Stephen Dahlstedt, MD  Please review  

## 2022-11-15 ENCOUNTER — Other Ambulatory Visit: Payer: Self-pay | Admitting: Nurse Practitioner

## 2022-11-15 DIAGNOSIS — M5136 Other intervertebral disc degeneration, lumbar region: Secondary | ICD-10-CM

## 2022-12-10 ENCOUNTER — Other Ambulatory Visit: Payer: Self-pay | Admitting: Nurse Practitioner

## 2022-12-10 DIAGNOSIS — G2581 Restless legs syndrome: Secondary | ICD-10-CM

## 2022-12-15 ENCOUNTER — Emergency Department (HOSPITAL_COMMUNITY): Payer: 59

## 2022-12-15 ENCOUNTER — Emergency Department (HOSPITAL_COMMUNITY)
Admission: EM | Admit: 2022-12-15 | Discharge: 2022-12-15 | Disposition: A | Discharge status: Home / Self Care | Payer: 59 | Attending: Emergency Medicine | Admitting: Emergency Medicine

## 2022-12-15 ENCOUNTER — Other Ambulatory Visit: Payer: Self-pay

## 2022-12-15 DIAGNOSIS — R0789 Other chest pain: Secondary | ICD-10-CM | POA: Diagnosis present

## 2022-12-15 LAB — BASIC METABOLIC PANEL
Anion gap: 12 (ref 5–15)
BUN: 9 mg/dL (ref 6–20)
CO2: 21 mmol/L — ABNORMAL LOW (ref 22–32)
Calcium: 8.8 mg/dL — ABNORMAL LOW (ref 8.9–10.3)
Chloride: 99 mmol/L (ref 98–111)
Creatinine, Ser: 0.76 mg/dL (ref 0.61–1.24)
GFR, Estimated: >60 mL/min (ref >60)
Glucose, Bld: 82 mg/dL (ref 70–99)
Potassium: 4.1 mmol/L (ref 3.5–5.1)
Sodium: 132 mmol/L — ABNORMAL LOW (ref 135–145)

## 2022-12-15 LAB — CBC
HCT: 39.2 % (ref 39.0–52.0)
Hemoglobin: 13.6 g/dL (ref 13.0–17.0)
MCH: 29.4 pg (ref 26.0–34.0)
MCHC: 34.7 g/dL (ref 30.0–36.0)
MCV: 84.8 fL (ref 80.0–100.0)
Platelets: 205 10*3/uL (ref 150–400)
RBC: 4.62 MIL/uL (ref 4.22–5.81)
RDW: 12.2 % (ref 11.5–15.5)
WBC: 5.1 10*3/uL (ref 4.0–10.5)
nRBC: 0 % (ref 0.0–0.2)

## 2022-12-15 LAB — TROPONIN I (HIGH SENSITIVITY)
Troponin I (High Sensitivity): 4 ng/L (ref <18)
Troponin I (High Sensitivity): 4 ng/L (ref <18)

## 2022-12-15 MED ORDER — MORPHINE SULFATE (PF) 4 MG/ML IV SOLN
4.0000 mg | Freq: Once | INTRAVENOUS | Status: AC
  Administered 2022-12-15: 4 mg via INTRAVENOUS
  Filled 2022-12-15: qty 1

## 2022-12-15 MED ORDER — OXYCODONE-ACETAMINOPHEN 5-325 MG PO TABS: 1.0000 | ORAL_TABLET | Freq: Four times a day (QID) | ORAL | 0 refills | Status: AC | PRN

## 2022-12-15 MED ORDER — ONDANSETRON HCL 4 MG/2ML IJ SOLN
4.0000 mg | Freq: Once | INTRAMUSCULAR | Status: AC
  Administered 2022-12-15: 4 mg via INTRAVENOUS
  Filled 2022-12-15: qty 2

## 2022-12-15 MED ORDER — KETOROLAC TROMETHAMINE 30 MG/ML IJ SOLN
30.0000 mg | Freq: Once | INTRAMUSCULAR | Status: AC
  Administered 2022-12-15: 30 mg via INTRAVENOUS
  Filled 2022-12-15: qty 1

## 2022-12-15 MED ORDER — IOHEXOL 350 MG/ML SOLN
100.0000 mL | Freq: Once | INTRAVENOUS | Status: AC | PRN
  Administered 2022-12-15: 100 mL via INTRAVENOUS

## 2022-12-15 MED ORDER — SODIUM CHLORIDE 0.9 % IV BOLUS
1000.0000 mL | Freq: Once | INTRAVENOUS | Status: AC
  Administered 2022-12-15: 1000 mL via INTRAVENOUS

## 2022-12-15 NOTE — ED Provider Notes (Signed)
[place] EMERGENCY DEPARTMENT AT [facility] Provider Note   CSN: [id number] Arrival date & time: 12/15/22  0430     History  Chief Complaint  Patient presents with   Chest Pain    Jared Beck is a 62 y.o. male.  Patient is a 62 year old male presenting with complaints of chest pain.  He states this started approximately 1 hour prior to presentation.  He has a history of arthritis with shoulder replacement, gout, hyperlipidemia.  He describes a sharp pain to the left anterior chest wall that is worse when he moves or breathes.  He denies any shortness of breath, diaphoresis, or radiation to the arm or jaw.  The history is provided by the patient.       Home Medications Prior to Admission medications   Medication Sig Start Date End Date Taking? Authorizing Provider  allopurinol (ZYLOPRIM) 300 MG tablet TAKE 1 TABLET BY MOUTH ONCE DAILY. 05/13/22   Sharon Seller, NP  atorvastatin (LIPITOR) 20 MG tablet Take 20 mg by mouth daily.    [provider]  benzonatate (TESSALON) 100 MG capsule Take 1 capsule (100 mg total) by mouth every 8 (eight) hours. 07/18/22   Particia Nearing, PA-C  Cholecalciferol (VITAMIN D3) 50 MCG CAPS Take by mouth.    [provider]  Cholecalciferol (VITAMIN D3) 5000 units CAPS Take 5,000 Units by mouth daily. Take two daily    [provider]  ciprofloxacin (CIPRO) 500 MG tablet Take 1 tablet (500 mg total) by mouth every 12 (twelve) hours. 10/27/22   Gerhard Munch, MD  FLUoxetine (PROZAC) 10 MG capsule Take 1 capsule (10 mg total) by mouth daily. For mood control Patient not taking: Reported on 07/18/2022 07/30/21   Sharon Seller, NP  fluticasone [facility]) 50 MCG/ACT nasal spray Place 1 spray into both nostrils 2 (two) times daily. 07/18/22   Particia Nearing, PA-C  gabapentin (NEURONTIN) 300 MG capsule Take 1 capsule (300 mg total) by mouth 3 (three) times daily. 07/30/21   Sharon Seller,  NP  levothyroxine (SYNTHROID) 75 MCG tablet Take 1 tablet (75 mcg total) by mouth daily. 07/30/21   Sharon Seller, NP  naproxen (NAPROSYN) 500 MG tablet Take 1 tablet (500 mg total) by mouth 2 (two) times daily. 07/30/21   Sharon Seller, NP  ondansetron (ZOFRAN-ODT) 4 MG disintegrating tablet Take 1 tablet (4 mg total) by mouth every 8 (eight) hours as needed for nausea or vomiting. 07/18/22   Particia Nearing, PA-C  oseltamivir (TAMIFLU) 75 MG capsule Take 1 capsule (75 mg total) by mouth every 12 (twelve) hours. 07/18/22   Particia Nearing, PA-C  pramipexole (MIRAPEX) 0.5 MG tablet TAKE (1) TABLET BY MOUTH AT BEDTIME. Patient not taking: Reported on 07/18/2022 11/06/21   Sharon Seller, NP  tamsulosin (FLOMAX) 0.4 MG CAPS capsule Take 1 capsule (0.4 mg total) by mouth daily. 10/27/22   Gerhard Munch, MD  traZODone (DESYREL) 50 MG tablet Take 1 tablet (50 mg total) by mouth at bedtime as needed for sleep. 07/30/21   Sharon Seller, NP      Allergies    Cephalexin, Penicillin g, Hydrocodone, and Vicodin [hydrocodone-acetaminophen]    Review of Systems   Review of Systems  All other systems reviewed and are negative.   Physical Exam Updated Vital Signs BP 137/87   Pulse 76   Temp 97.8 F (36.6 C) (Oral)   Resp 17   Ht 5\' 7"  (1.702  m)   Wt 73.9 kg   SpO2 100%   BMI 25.53 kg/m  Physical Exam Vitals and nursing note reviewed.  Constitutional:      General: He is not in acute distress.    Appearance: He is well-developed. He is not diaphoretic.  HENT:     Head: Normocephalic and atraumatic.  Cardiovascular:     Rate and Rhythm: Normal rate and regular rhythm.     Heart sounds: No murmur heard.    No friction rub.  Pulmonary:     Effort: Pulmonary effort is normal. No respiratory distress.     Breath sounds: Normal breath sounds. No wheezing or rales.  Abdominal:     General: Bowel sounds are normal. There is no distension.     Palpations: Abdomen  is soft.     Tenderness: There is no abdominal tenderness.  Musculoskeletal:        General: Normal range of motion.     Cervical back: Normal range of motion and neck supple.  Skin:    General: Skin is warm and dry.  Neurological:     Mental Status: He is alert and oriented to person, place, and time.     Coordination: Coordination normal.     ED Results / Procedures / Treatments   Labs (all labs ordered are listed, but only abnormal results are displayed) Labs Reviewed  BASIC METABOLIC PANEL  CBC  TROPONIN I (HIGH SENSITIVITY)    EKG None  Radiology No results found.  Procedures Procedures  {Document cardiac monitor, telemetry assessment procedure when appropriate:1}  Medications Ordered in ED Medications  morphine (PF) 4 MG/ML injection 4 mg (has no administration in time range)  ondansetron (ZOFRAN) injection 4 mg (has no administration in time range)  sodium chloride 0.9 % bolus 1,000 mL (has no administration in time range)    ED Course/ Medical Decision Making/ A&P   {   Click here for ABCD2, HEART and other calculatorsREFRESH Note before signing :1}                          Medical Decision Making Amount and/or Complexity of Data Reviewed Labs: ordered. Radiology: ordered.  Risk Prescription drug management.   ***  {Document critical care time when appropriate:1} {Document review of labs and clinical decision tools ie heart score, Chads2Vasc2 etc:1}  {Document your independent review of radiology images, and any outside records:1} {Document your discussion with family members, caretakers, and with consultants:1} {Document social determinants of health affecting pt's care:1} {Document your decision making why or why not admission, treatments were needed:1} Final Clinical Impression(s) / ED Diagnoses Final diagnoses:  None    Rx / DC Orders ED Discharge Orders     None

## 2022-12-15 NOTE — ED Notes (Signed)
Pt in bed, sig other at bedside, pt states that he has decreased pain and is ready to go home, pt verbalized understanding d/c and follow up, pt from dpt.

## 2022-12-15 NOTE — ED Notes (Signed)
Patient transported to CT 

## 2022-12-15 NOTE — ED Triage Notes (Addendum)
Pt c/o chest pain that radiates up left side of neck and numbness to left arm.   Pt states he is weaker than normal, more so on left side than right.  Dr Judd Lien made aware.

## 2022-12-15 NOTE — Discharge Instructions (Signed)
Take ibuprofen 600 mg 3 times daily for the next 5 days.  Begin taking Percocet as prescribed as needed for pain not relieved with ibuprofen.  Rest.  Follow-up with your primary doctor if symptoms are not improving in the next few days, and return to the ER if symptoms significantly worsen or change.

## 2023-01-11 ENCOUNTER — Other Ambulatory Visit: Payer: Self-pay | Admitting: Nurse Practitioner

## 2023-01-11 DIAGNOSIS — G2581 Restless legs syndrome: Secondary | ICD-10-CM

## 2023-02-14 ENCOUNTER — Other Ambulatory Visit: Payer: Self-pay | Admitting: Nurse Practitioner

## 2023-02-14 DIAGNOSIS — G2581 Restless legs syndrome: Secondary | ICD-10-CM

## 2023-03-21 IMAGING — CT CT CERVICAL SPINE W/O CM
3 of 4 series · 13 of 33 positions shown, 16 images · non-contrast
Comparison: None.

CLINICAL DATA: Chronic neck pain, unable to turn head, no specific
injury

EXAM:
CT CERVICAL SPINE WITHOUT CONTRAST
TECHNIQUE: Multidetector CT imaging of the cervical spine was performed without
intravenous contrast. Multiplanar CT image reconstructions were also
generated.

[Series 5: sag bone · sagittal · 0.26mm/px · 5 of 61 slices shown, 6 images]
[im 21/61  bone]
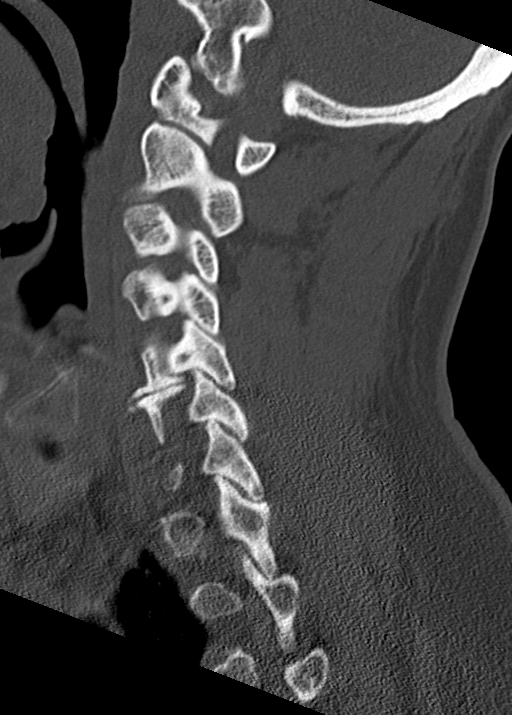
[im 26/61  bone]
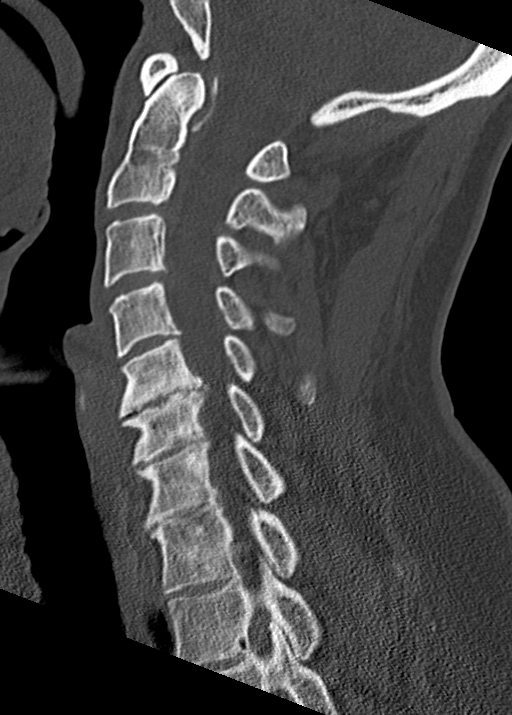
[im 31/61  soft-tissue]
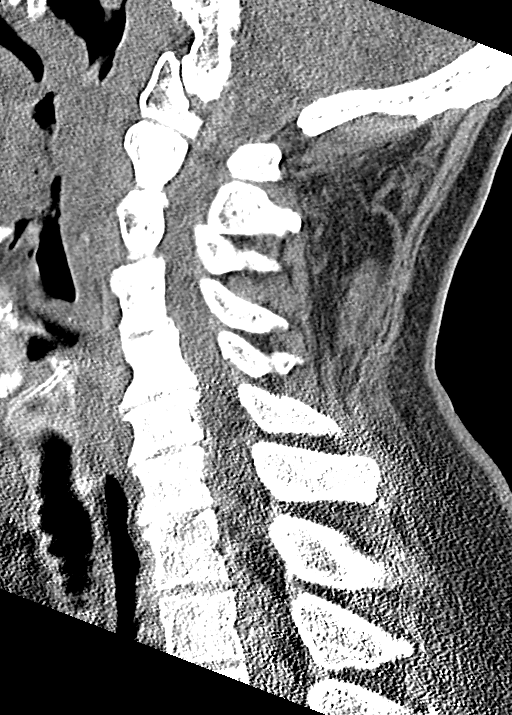
[im 31/61  bone]
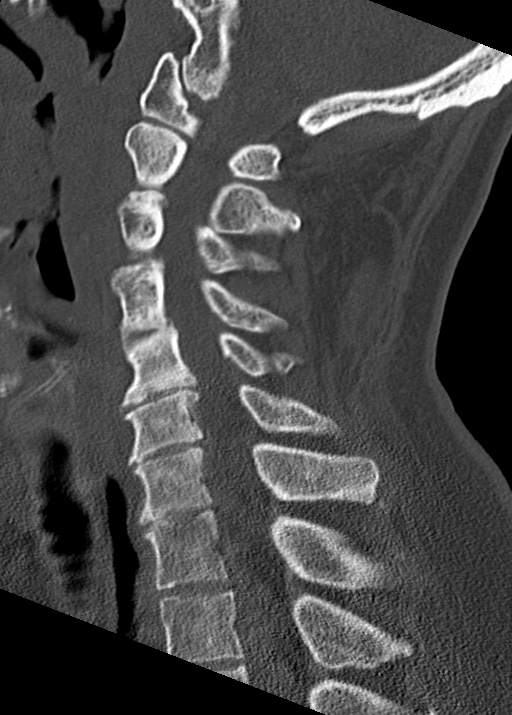
[im 36/61  bone]
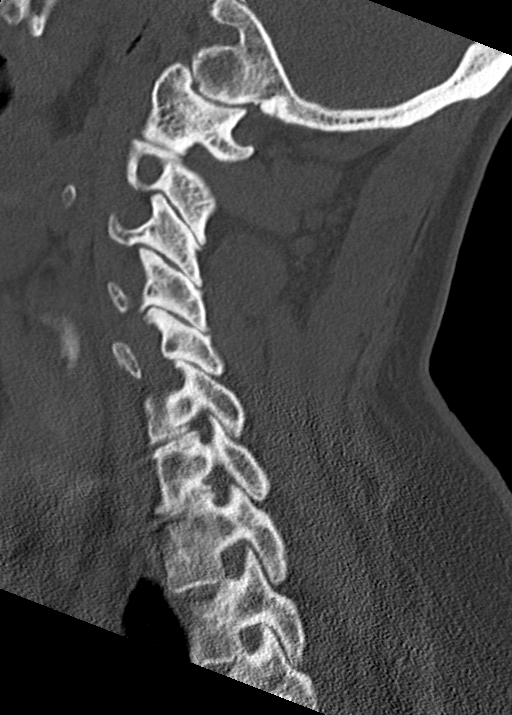
[im 41/61  bone]
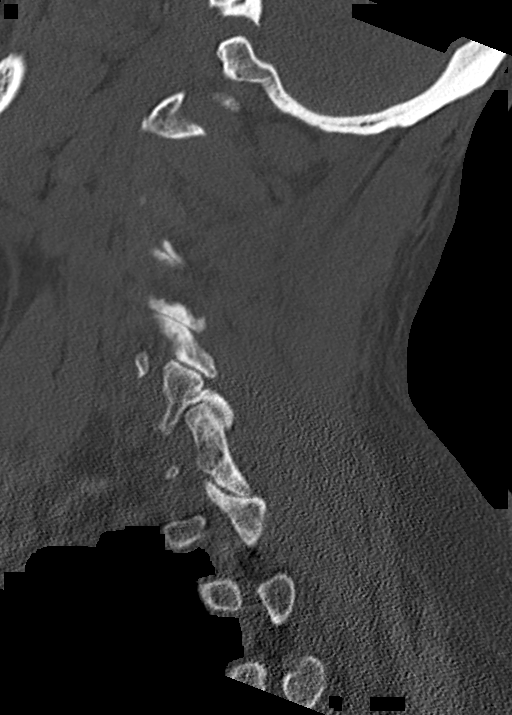

[Series 6: cor bone · coronal · 0.24mm/px · 3 of 61 slices shown]
[im 13/61  bone]
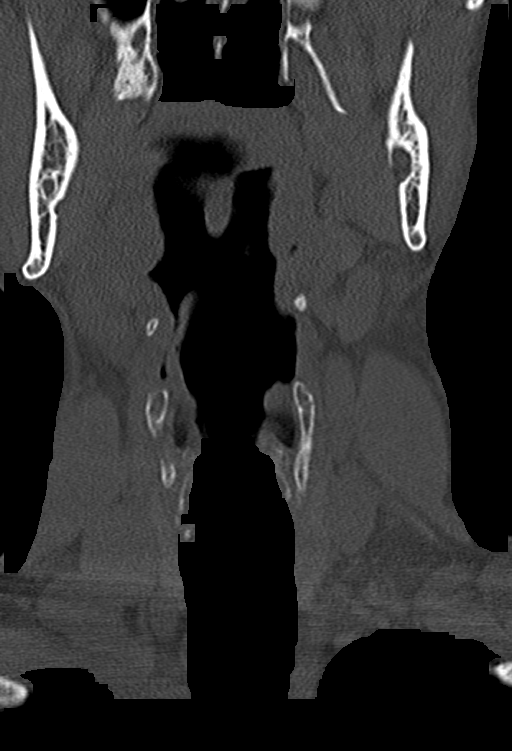
[im 25/61  bone]
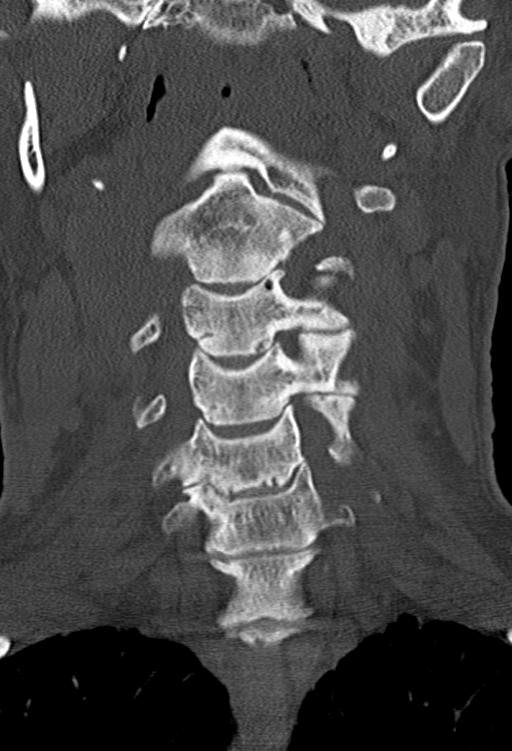
[im 37/61  bone]
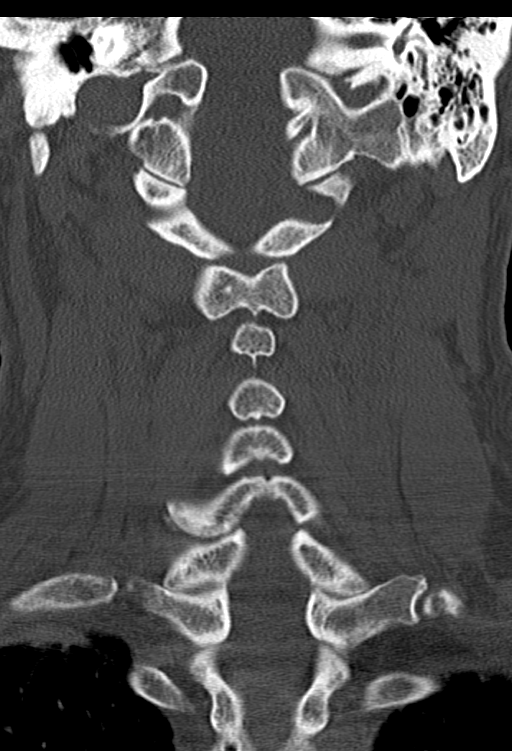

[Series 7: orthogonal axials · axial · 0.21mm/px · z∈[+1462,+1557]mm · 5 of 88 slices shown, 7 images]
[im 15/88  soft-tissue]
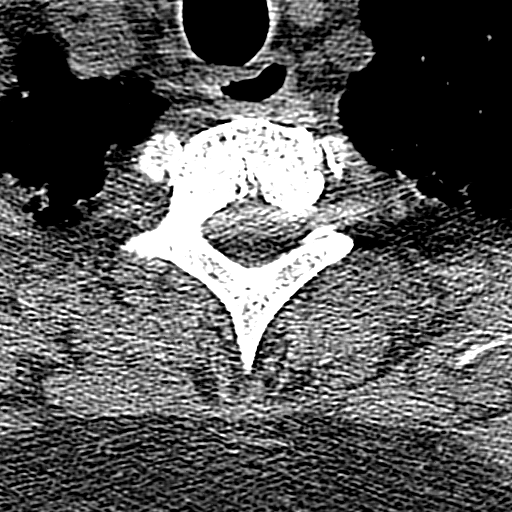
[im 15/88  bone]
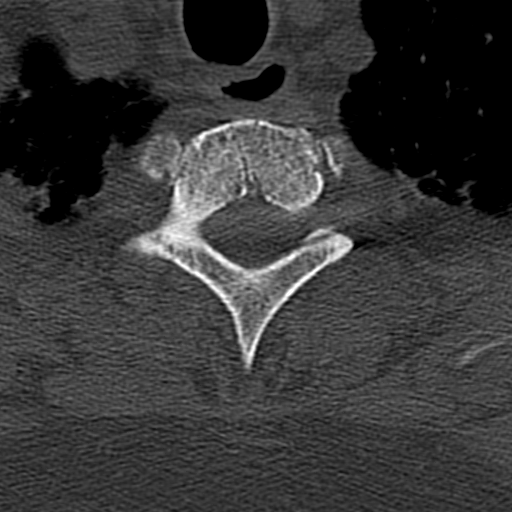
[im 30/88  bone]
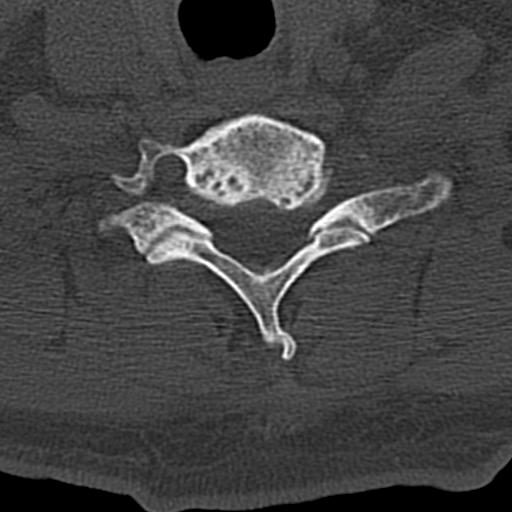
[im 44/88  bone]
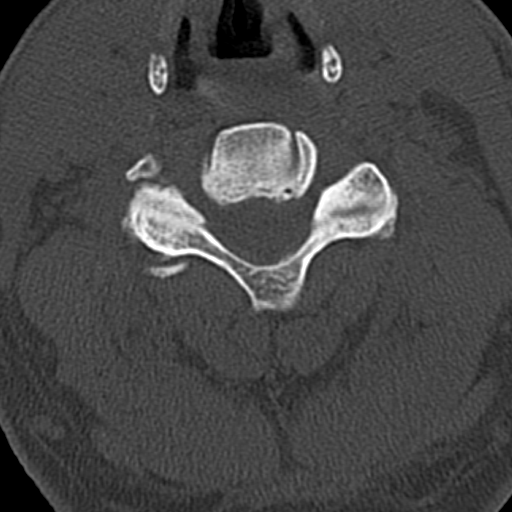
[im 59/88  bone]
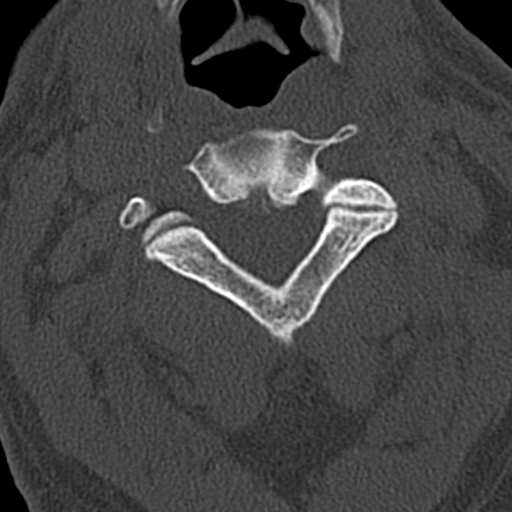
[im 73/88  soft-tissue]
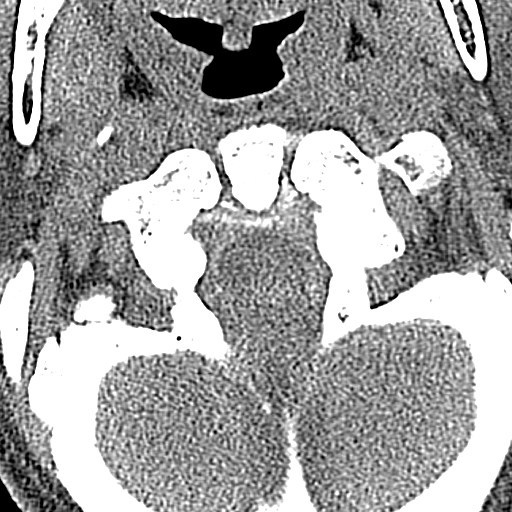
[im 73/88  bone]
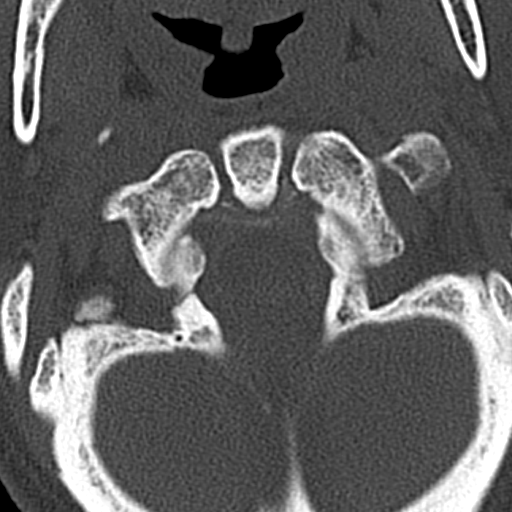

[13 of 33 positions shown; findings below may reference images not displayed]

FINDINGS: Alignment: Normal.

Skull base and vertebrae: No acute fracture. No primary bone lesion
or focal pathologic process.

Soft tissues and spinal canal: No prevertebral fluid or swelling. No
visible canal hematoma.

Disc levels: Mild to moderate multilevel disc space height loss and
osteophytosis throughout the cervical spine, worst at C5 through C7.

Upper chest: Negative.

Other: None.
IMPRESSION: 1. No fracture or static subluxation of the cervical spine.
2. Mild to moderate multilevel disc space height loss and
osteophytosis throughout the cervical spine, worst at C5 through C7.
Cervical disc and neural foraminal pathology may be further
evaluated by MRI if indicated by neurologically localizing signs and
symptoms.

## 2023-04-13 ENCOUNTER — Other Ambulatory Visit: Payer: Self-pay | Admitting: Nurse Practitioner

## 2023-04-13 DIAGNOSIS — G2581 Restless legs syndrome: Secondary | ICD-10-CM

## 2023-05-10 ENCOUNTER — Ambulatory Visit: Payer: 59 | Admitting: Urology

## 2023-05-12 ENCOUNTER — Other Ambulatory Visit: Payer: Self-pay | Admitting: Nurse Practitioner

## 2023-05-12 DIAGNOSIS — M51369 Other intervertebral disc degeneration, lumbar region without mention of lumbar back pain or lower extremity pain: Secondary | ICD-10-CM

## 2023-05-12 DIAGNOSIS — G2581 Restless legs syndrome: Secondary | ICD-10-CM

## 2023-08-01 DIAGNOSIS — H9209 Otalgia, unspecified ear: Secondary | ICD-10-CM | POA: Diagnosis not present

## 2023-08-01 DIAGNOSIS — H6692 Otitis media, unspecified, left ear: Secondary | ICD-10-CM | POA: Diagnosis not present

## 2023-08-01 DIAGNOSIS — Z6822 Body mass index (BMI) 22.0-22.9, adult: Secondary | ICD-10-CM | POA: Diagnosis not present

## 2023-08-16 DIAGNOSIS — F5101 Primary insomnia: Secondary | ICD-10-CM | POA: Diagnosis not present

## 2023-08-16 DIAGNOSIS — M150 Primary generalized (osteo)arthritis: Secondary | ICD-10-CM | POA: Diagnosis not present

## 2023-08-16 DIAGNOSIS — Z7689 Persons encountering health services in other specified circumstances: Secondary | ICD-10-CM | POA: Diagnosis not present

## 2023-08-16 DIAGNOSIS — Z6823 Body mass index (BMI) 23.0-23.9, adult: Secondary | ICD-10-CM | POA: Diagnosis not present

## 2023-08-26 DIAGNOSIS — M545 Low back pain, unspecified: Secondary | ICD-10-CM | POA: Diagnosis not present

## 2023-08-26 DIAGNOSIS — Z880 Allergy status to penicillin: Secondary | ICD-10-CM | POA: Diagnosis not present

## 2023-08-26 DIAGNOSIS — Z20822 Contact with and (suspected) exposure to covid-19: Secondary | ICD-10-CM | POA: Diagnosis not present

## 2023-08-26 DIAGNOSIS — Z743 Need for continuous supervision: Secondary | ICD-10-CM | POA: Diagnosis not present

## 2023-08-26 DIAGNOSIS — Z885 Allergy status to narcotic agent status: Secondary | ICD-10-CM | POA: Diagnosis not present

## 2023-08-26 DIAGNOSIS — M5430 Sciatica, unspecified side: Secondary | ICD-10-CM | POA: Diagnosis not present

## 2023-08-26 DIAGNOSIS — M5431 Sciatica, right side: Secondary | ICD-10-CM | POA: Diagnosis not present

## 2023-08-26 DIAGNOSIS — K219 Gastro-esophageal reflux disease without esophagitis: Secondary | ICD-10-CM | POA: Diagnosis not present

## 2023-08-26 DIAGNOSIS — R531 Weakness: Secondary | ICD-10-CM | POA: Diagnosis not present

## 2023-10-04 DIAGNOSIS — M79605 Pain in left leg: Secondary | ICD-10-CM | POA: Diagnosis not present

## 2023-10-04 DIAGNOSIS — Z743 Need for continuous supervision: Secondary | ICD-10-CM | POA: Diagnosis not present

## 2023-10-04 DIAGNOSIS — M79606 Pain in leg, unspecified: Secondary | ICD-10-CM | POA: Diagnosis not present

## 2023-10-04 DIAGNOSIS — M109 Gout, unspecified: Secondary | ICD-10-CM | POA: Diagnosis not present

## 2023-10-04 DIAGNOSIS — M545 Low back pain, unspecified: Secondary | ICD-10-CM | POA: Diagnosis not present

## 2023-10-04 DIAGNOSIS — R600 Localized edema: Secondary | ICD-10-CM | POA: Diagnosis not present

## 2023-10-04 DIAGNOSIS — R6889 Other general symptoms and signs: Secondary | ICD-10-CM | POA: Diagnosis not present

## 2023-10-04 DIAGNOSIS — Z885 Allergy status to narcotic agent status: Secondary | ICD-10-CM | POA: Diagnosis not present

## 2023-10-04 DIAGNOSIS — R7989 Other specified abnormal findings of blood chemistry: Secondary | ICD-10-CM | POA: Diagnosis not present

## 2023-10-04 DIAGNOSIS — Z881 Allergy status to other antibiotic agents status: Secondary | ICD-10-CM | POA: Diagnosis not present

## 2023-10-04 DIAGNOSIS — M1380 Other specified arthritis, unspecified site: Secondary | ICD-10-CM | POA: Diagnosis not present

## 2023-10-04 DIAGNOSIS — R609 Edema, unspecified: Secondary | ICD-10-CM | POA: Diagnosis not present

## 2023-10-04 DIAGNOSIS — M7989 Other specified soft tissue disorders: Secondary | ICD-10-CM | POA: Diagnosis not present

## 2023-10-04 DIAGNOSIS — R2243 Localized swelling, mass and lump, lower limb, bilateral: Secondary | ICD-10-CM | POA: Diagnosis not present

## 2023-10-04 DIAGNOSIS — Z880 Allergy status to penicillin: Secondary | ICD-10-CM | POA: Diagnosis not present

## 2023-10-05 ENCOUNTER — Encounter (HOSPITAL_COMMUNITY): Payer: Self-pay

## 2023-10-05 DIAGNOSIS — M79605 Pain in left leg: Secondary | ICD-10-CM | POA: Diagnosis not present

## 2023-10-05 DIAGNOSIS — Z79899 Other long term (current) drug therapy: Secondary | ICD-10-CM | POA: Diagnosis not present

## 2023-10-05 DIAGNOSIS — R600 Localized edema: Secondary | ICD-10-CM | POA: Diagnosis not present

## 2023-10-05 DIAGNOSIS — I739 Peripheral vascular disease, unspecified: Secondary | ICD-10-CM | POA: Diagnosis not present

## 2023-10-05 DIAGNOSIS — M79604 Pain in right leg: Secondary | ICD-10-CM | POA: Diagnosis not present

## 2023-10-05 DIAGNOSIS — I708 Atherosclerosis of other arteries: Secondary | ICD-10-CM | POA: Diagnosis not present

## 2023-10-05 DIAGNOSIS — I70202 Unspecified atherosclerosis of native arteries of extremities, left leg: Secondary | ICD-10-CM | POA: Diagnosis not present

## 2023-10-05 DIAGNOSIS — K219 Gastro-esophageal reflux disease without esophagitis: Secondary | ICD-10-CM | POA: Diagnosis not present

## 2023-10-05 DIAGNOSIS — M7989 Other specified soft tissue disorders: Secondary | ICD-10-CM | POA: Diagnosis not present

## 2023-10-05 DIAGNOSIS — G8929 Other chronic pain: Secondary | ICD-10-CM | POA: Diagnosis not present

## 2023-10-06 ENCOUNTER — Telehealth: Payer: Self-pay | Admitting: Surgery

## 2023-10-26 DIAGNOSIS — I739 Peripheral vascular disease, unspecified: Secondary | ICD-10-CM | POA: Diagnosis not present

## 2023-10-26 DIAGNOSIS — M79675 Pain in left toe(s): Secondary | ICD-10-CM | POA: Diagnosis not present

## 2023-10-26 DIAGNOSIS — M79672 Pain in left foot: Secondary | ICD-10-CM | POA: Diagnosis not present

## 2023-10-26 DIAGNOSIS — L110 Acquired keratosis follicularis: Secondary | ICD-10-CM | POA: Diagnosis not present

## 2023-10-26 DIAGNOSIS — M79671 Pain in right foot: Secondary | ICD-10-CM | POA: Diagnosis not present

## 2023-10-26 DIAGNOSIS — M79674 Pain in right toe(s): Secondary | ICD-10-CM | POA: Diagnosis not present

## 2023-11-02 DIAGNOSIS — W1839XA Other fall on same level, initial encounter: Secondary | ICD-10-CM | POA: Diagnosis not present

## 2023-11-02 DIAGNOSIS — M109 Gout, unspecified: Secondary | ICD-10-CM | POA: Diagnosis not present

## 2023-11-02 DIAGNOSIS — M1990 Unspecified osteoarthritis, unspecified site: Secondary | ICD-10-CM | POA: Diagnosis not present

## 2023-11-02 DIAGNOSIS — S8391XA Sprain of unspecified site of right knee, initial encounter: Secondary | ICD-10-CM | POA: Diagnosis not present

## 2023-11-03 NOTE — Telephone Encounter (Signed)
Pt appointment scheduled.

## 2023-11-04 DIAGNOSIS — S8391XA Sprain of unspecified site of right knee, initial encounter: Secondary | ICD-10-CM | POA: Diagnosis not present

## 2023-11-04 DIAGNOSIS — M10071 Idiopathic gout, right ankle and foot: Secondary | ICD-10-CM | POA: Diagnosis not present

## 2023-11-04 DIAGNOSIS — X58XXXA Exposure to other specified factors, initial encounter: Secondary | ICD-10-CM | POA: Diagnosis not present

## 2023-11-04 DIAGNOSIS — M109 Gout, unspecified: Secondary | ICD-10-CM | POA: Diagnosis not present

## 2023-11-04 DIAGNOSIS — M19071 Primary osteoarthritis, right ankle and foot: Secondary | ICD-10-CM | POA: Diagnosis not present

## 2023-11-04 DIAGNOSIS — M1711 Unilateral primary osteoarthritis, right knee: Secondary | ICD-10-CM | POA: Diagnosis not present

## 2023-11-04 DIAGNOSIS — M25571 Pain in right ankle and joints of right foot: Secondary | ICD-10-CM | POA: Diagnosis not present

## 2023-11-04 DIAGNOSIS — M11261 Other chondrocalcinosis, right knee: Secondary | ICD-10-CM | POA: Diagnosis not present

## 2023-11-04 DIAGNOSIS — M25561 Pain in right knee: Secondary | ICD-10-CM | POA: Diagnosis not present

## 2023-11-08 ENCOUNTER — Ambulatory Visit: Admitting: Vascular Surgery

## 2023-11-10 DIAGNOSIS — M19072 Primary osteoarthritis, left ankle and foot: Secondary | ICD-10-CM | POA: Diagnosis not present

## 2023-11-10 DIAGNOSIS — E782 Mixed hyperlipidemia: Secondary | ICD-10-CM | POA: Diagnosis not present

## 2023-11-10 DIAGNOSIS — E038 Other specified hypothyroidism: Secondary | ICD-10-CM | POA: Diagnosis not present

## 2023-11-10 DIAGNOSIS — D518 Other vitamin B12 deficiency anemias: Secondary | ICD-10-CM | POA: Diagnosis not present

## 2023-11-10 DIAGNOSIS — R0602 Shortness of breath: Secondary | ICD-10-CM | POA: Diagnosis not present

## 2023-11-10 DIAGNOSIS — Z79899 Other long term (current) drug therapy: Secondary | ICD-10-CM | POA: Diagnosis not present

## 2023-11-10 DIAGNOSIS — M10279 Drug-induced gout, unspecified ankle and foot: Secondary | ICD-10-CM | POA: Diagnosis not present

## 2023-12-06 ENCOUNTER — Ambulatory Visit: Admitting: Vascular Surgery

## 2023-12-08 ENCOUNTER — Ambulatory Visit: Admitting: Vascular Surgery

## 2024-01-31 DIAGNOSIS — E039 Hypothyroidism, unspecified: Secondary | ICD-10-CM | POA: Diagnosis not present

## 2024-01-31 DIAGNOSIS — E782 Mixed hyperlipidemia: Secondary | ICD-10-CM | POA: Diagnosis not present

## 2024-01-31 DIAGNOSIS — Z6823 Body mass index (BMI) 23.0-23.9, adult: Secondary | ICD-10-CM | POA: Diagnosis not present

## 2024-01-31 DIAGNOSIS — Z1211 Encounter for screening for malignant neoplasm of colon: Secondary | ICD-10-CM | POA: Diagnosis not present

## 2024-01-31 DIAGNOSIS — F5101 Primary insomnia: Secondary | ICD-10-CM | POA: Diagnosis not present

## 2024-01-31 DIAGNOSIS — M1A0790 Idiopathic chronic gout, unspecified ankle and foot, without tophus (tophi): Secondary | ICD-10-CM | POA: Diagnosis not present

## 2024-01-31 DIAGNOSIS — M150 Primary generalized (osteo)arthritis: Secondary | ICD-10-CM | POA: Diagnosis not present

## 2024-01-31 DIAGNOSIS — Z1212 Encounter for screening for malignant neoplasm of rectum: Secondary | ICD-10-CM | POA: Diagnosis not present

## 2024-02-05 DIAGNOSIS — Z1212 Encounter for screening for malignant neoplasm of rectum: Secondary | ICD-10-CM | POA: Diagnosis not present

## 2024-02-05 DIAGNOSIS — Z1211 Encounter for screening for malignant neoplasm of colon: Secondary | ICD-10-CM | POA: Diagnosis not present

## 2024-02-11 DIAGNOSIS — M1000 Idiopathic gout, unspecified site: Secondary | ICD-10-CM | POA: Diagnosis not present

## 2024-02-11 DIAGNOSIS — M79671 Pain in right foot: Secondary | ICD-10-CM | POA: Diagnosis not present

## 2024-02-11 DIAGNOSIS — Z6823 Body mass index (BMI) 23.0-23.9, adult: Secondary | ICD-10-CM | POA: Diagnosis not present

## 2024-02-11 LAB — COLOGUARD: COLOGUARD: NEGATIVE

## 2024-02-28 ENCOUNTER — Emergency Department (HOSPITAL_COMMUNITY)

## 2024-02-28 ENCOUNTER — Emergency Department (HOSPITAL_COMMUNITY)
Admission: EM | Admit: 2024-02-28 | Discharge: 2024-02-28 | Disposition: A | Discharge status: Home / Self Care | Attending: Emergency Medicine | Admitting: Emergency Medicine

## 2024-02-28 DIAGNOSIS — M546 Pain in thoracic spine: Secondary | ICD-10-CM | POA: Diagnosis not present

## 2024-02-28 DIAGNOSIS — W19XXXA Unspecified fall, initial encounter: Secondary | ICD-10-CM | POA: Insufficient documentation

## 2024-02-28 DIAGNOSIS — M549 Dorsalgia, unspecified: Secondary | ICD-10-CM | POA: Diagnosis not present

## 2024-02-28 DIAGNOSIS — M51369 Other intervertebral disc degeneration, lumbar region without mention of lumbar back pain or lower extremity pain: Secondary | ICD-10-CM

## 2024-02-28 DIAGNOSIS — M545 Low back pain, unspecified: Secondary | ICD-10-CM | POA: Diagnosis not present

## 2024-02-28 DIAGNOSIS — Z981 Arthrodesis status: Secondary | ICD-10-CM | POA: Diagnosis not present

## 2024-02-28 DIAGNOSIS — M5134 Other intervertebral disc degeneration, thoracic region: Secondary | ICD-10-CM | POA: Diagnosis not present

## 2024-02-28 DIAGNOSIS — M4804 Spinal stenosis, thoracic region: Secondary | ICD-10-CM | POA: Diagnosis not present

## 2024-02-28 MED ORDER — NAPROXEN 500 MG PO TABS
500.0000 mg | ORAL_TABLET | Freq: Two times a day (BID) | ORAL | 0 refills | Status: AC
Start: 2024-02-28 — End: ?

## 2024-02-28 MED ORDER — LIDOCAINE 5 % EX PTCH
1.0000 | MEDICATED_PATCH | CUTANEOUS | Status: DC
  Administered 2024-02-28 (×2): 1 via TRANSDERMAL
  Filled 2024-02-28: qty 1

## 2024-02-28 MED ORDER — KETOROLAC TROMETHAMINE 15 MG/ML IJ SOLN
15.0000 mg | Freq: Once | INTRAMUSCULAR | Status: AC
  Administered 2024-02-28 (×2): 15 mg via INTRAMUSCULAR
  Filled 2024-02-28: qty 1

## 2024-02-28 MED ORDER — LIDOCAINE 5 % EX PTCH: 1.0000 | MEDICATED_PATCH | CUTANEOUS | 0 refills | Status: AC

## 2024-02-28 MED ORDER — METHOCARBAMOL 500 MG PO TABS: 500.0000 mg | ORAL_TABLET | Freq: Four times a day (QID) | ORAL | 0 refills | Status: AC | PRN

## 2024-02-28 NOTE — ED Provider Notes (Signed)
 [place] EMERGENCY DEPARTMENT AT [facility] Provider Note   CSN: [id number] Arrival date & time: 02/28/24  2109     Patient presents with: Back Pain and Fall   Jared Beck is a 63 y.o. male.  He has a history of rheumatoid arthritis, degenerative disc disease of the lumbar spine, restless leg syndrome. He is in the ER today complaining of back pain from his low back to his upper back x 3 days after falling down.  [place] him and bumped him, causing him to lose his balance and fell backwards.  Denies head injury or loss of consciousness, denies neck pain. Reports his pain is gradually worsened, it is worse with changing position and with movement.  Denies fever or chills, denies saddle anesthesia or paresthesia, no bowel or bladder incontinence.  He reports the pain is gradually worsened over the past several days and today has been the worst.  He is taking Tylenol  with some mild relief.  He denies any chest pain, shortness of breath, or other associated symptoms.    Back Pain Fall       Prior to Admission medications   Medication Sig Start Date End Date Taking? Authorizing Provider  allopurinol  (ZYLOPRIM ) 300 MG tablet TAKE 1 TABLET BY MOUTH ONCE DAILY. 05/13/22   Eubanks, Jessica K, NP  atorvastatin (LIPITOR) 20 MG tablet Take 20 mg by mouth daily.    [provider]  benzonatate  (TESSALON ) 100 MG capsule Take 1 capsule (100 mg total) by mouth every 8 (eight) hours. 07/18/22   Stuart Vernell Norris, PA-C  Cholecalciferol  (VITAMIN D3) 50 MCG CAPS Take by mouth.    [provider]  Cholecalciferol  (VITAMIN D3) 5000 units CAPS Take 5,000 Units by mouth daily. Take two daily    [provider]  ciprofloxacin  (CIPRO ) 500 MG tablet Take 1 tablet (500 mg total) by mouth every 12 (twelve) hours. 10/27/22   Garrick Charleston, MD  FLUoxetine  (PROZAC ) 10 MG capsule Take 1 capsule (10 mg total) by  mouth daily. For mood control Patient not taking: Reported on 07/18/2022 07/30/21   Caro Harlene POUR, NP  fluticasone  (FLONASE ) 50 MCG/ACT nasal spray Place 1 spray into both nostrils 2 (two) times daily. 07/18/22   Stuart Vernell Norris, PA-C  gabapentin  (NEURONTIN ) 300 MG capsule Take 1 capsule (300 mg total) by mouth 3 (three) times daily. 07/30/21   Caro Harlene POUR, NP  levothyroxine  (SYNTHROID ) 75 MCG tablet Take 1 tablet (75 mcg total) by mouth daily. 07/30/21   Caro Harlene POUR, NP  naproxen  (NAPROSYN ) 500 MG tablet Take 1 tablet (500 mg total) by mouth 2 (two) times daily. 07/30/21   Caro Harlene POUR, NP  ondansetron  (ZOFRAN -ODT) 4 MG disintegrating tablet Take 1 tablet (4 mg total) by mouth every 8 (eight) hours as needed for nausea or vomiting. 07/18/22   Stuart Vernell Norris, PA-C  oseltamivir  (TAMIFLU ) 75 MG capsule Take 1 capsule (75 mg total) by mouth every 12 (twelve) hours. 07/18/22   Stuart Vernell Norris, PA-C  oxyCODONE -acetaminophen  (PERCOCET) 5-325 MG tablet Take 1-2 tablets by mouth every 6 (six) hours as needed. 12/15/22   Geroldine Berg, MD  pramipexole  (MIRAPEX ) 0.5 MG tablet TAKE (1) TABLET BY MOUTH AT BEDTIME. Patient not taking: Reported on 07/18/2022 11/06/21   Caro Harlene POUR, NP  tamsulosin  (FLOMAX ) 0.4 MG CAPS capsule Take 1 capsule (0.4 mg total) by mouth daily. 10/27/22   Garrick Charleston, MD  traZODone  (DESYREL ) 50 MG tablet Take 1 tablet (50 mg total) by mouth at bedtime as needed for sleep. 07/30/21   Eubanks, Jessica K, NP    Allergies: Cephalexin, Penicillin g, Hydrocodone , and Vicodin [hydrocodone -acetaminophen ]    Review of Systems  Musculoskeletal:  Positive for back pain.    Updated Vital Signs BP 98/72 (BP Location: Right Arm)   Pulse (!) 102   Temp 98.2 F (36.8 C) (Oral)   Resp 17   Ht 5' 7 (1.702 m)   Wt 69.4 kg   SpO2 100%   BMI 23.96 kg/m   Physical Exam Vitals and nursing note reviewed.  Constitutional:      General: He is  not in acute distress.    Appearance: He is well-developed.  HENT:     Head: Normocephalic and atraumatic.  Eyes:     Conjunctiva/sclera: Conjunctivae normal.  Cardiovascular:     Rate and Rhythm: Normal rate and regular rhythm.     Heart sounds: No murmur heard. Pulmonary:     Effort: Pulmonary effort is normal. No respiratory distress.     Breath sounds: Normal breath sounds.  Abdominal:     Palpations: Abdomen is soft.     Tenderness: There is no abdominal tenderness.  Musculoskeletal:        General: No swelling.     Cervical back: Neck supple.       Back:  Skin:    General: Skin is warm and dry.     Capillary Refill: Capillary refill takes less than 2 seconds.  Neurological:     Mental Status: He is alert.     GCS: GCS eye subscore is 4. GCS verbal subscore is 5. GCS motor subscore is 6.     Sensory: Sensation is intact.     Motor: Motor function is intact.     Deep Tendon Reflexes:     Reflex Scores:      Patellar reflexes are 2+ on the right side and 2+ on the left side.    Comments: Strength 5 out of 5 in bilateral lower extremities.  Sensation intact to light touch.  Psychiatric:        Mood and Affect: Mood normal.     (all labs ordered are listed, but only abnormal results are displayed) Labs Reviewed - No data to display  EKG: None  Radiology: No results found.   Procedures   Medications Ordered in the ED - No data to display                                  Medical Decision Making Differential diagnosis includes but limited to contusion, fracture, sprain, strain, HNP, other  ED course: Patient is here for gradually worsening diffuse back pain after a fall several days ago.  He has no other complaints and he did not strike his head.  He is not on blood thinners.  He has no neurologic complaints and has no signs or symptoms of cord compression or infection.    I ordered x-rays of the lumbar spine and thoracic spine which show post vertebroplasty  changes but no acute fracture or traumatic malalignment.  I agree with radiology reading.    Amount and/or Complexity of Data Reviewed Radiology: ordered.  Risk Prescription drug management.        Final diagnoses:  None    ED Discharge Orders     None  Suellen Sherran DELENA DEVONNA 02/28/24 2301    Cleotilde Rogue, MD 03/01/24 [phone]

## 2024-02-28 NOTE — ED Notes (Signed)
 AVS provided by edp was reviewed with the pt. Pt verbalized understanding with no additional questions at this time. Pt verified pharmacy. Pt going home with brother.

## 2024-02-28 NOTE — Discharge Instructions (Addendum)
 It was a pleasure taking care of you today.  You are seen in the ER for worsening back pain after a recent fall.  Fortunately your x-ray did not show any broken bones.  We are treating you with muscle relaxers and anti-inflammatory pain medicine.  He can continue taking over-the-counter Tylenol .  Follow-up with your PCP.  Come back to the ER for new or worsening symptoms.

## 2024-02-28 NOTE — ED Triage Notes (Signed)
 Pt comes in with back pain from a fall 3-4 days ago. [place] the pt and the pt fell backwards landing flat on his back.Pt had back surgery back in 2018. Pt is able to walk into triage with little issues. Pt is A&Ox4.

## 2024-03-10 DIAGNOSIS — I959 Hypotension, unspecified: Secondary | ICD-10-CM | POA: Diagnosis not present

## 2024-03-10 DIAGNOSIS — E860 Dehydration: Secondary | ICD-10-CM | POA: Diagnosis not present

## 2024-03-10 DIAGNOSIS — R55 Syncope and collapse: Secondary | ICD-10-CM | POA: Diagnosis not present

## 2024-03-10 DIAGNOSIS — I2699 Other pulmonary embolism without acute cor pulmonale: Secondary | ICD-10-CM | POA: Diagnosis not present

## 2024-03-10 DIAGNOSIS — R0602 Shortness of breath: Secondary | ICD-10-CM | POA: Diagnosis not present

## 2024-03-14 DIAGNOSIS — I9589 Other hypotension: Secondary | ICD-10-CM | POA: Diagnosis not present

## 2024-03-14 DIAGNOSIS — E860 Dehydration: Secondary | ICD-10-CM | POA: Diagnosis not present

## 2024-03-14 DIAGNOSIS — Z6824 Body mass index (BMI) 24.0-24.9, adult: Secondary | ICD-10-CM | POA: Diagnosis not present

## 2024-04-09 DIAGNOSIS — Z6823 Body mass index (BMI) 23.0-23.9, adult: Secondary | ICD-10-CM | POA: Diagnosis not present

## 2024-04-09 DIAGNOSIS — I9589 Other hypotension: Secondary | ICD-10-CM | POA: Diagnosis not present

## 2024-04-30 DIAGNOSIS — Z0000 Encounter for general adult medical examination without abnormal findings: Secondary | ICD-10-CM | POA: Diagnosis not present

## 2024-04-30 DIAGNOSIS — Z23 Encounter for immunization: Secondary | ICD-10-CM | POA: Diagnosis not present

## 2024-04-30 DIAGNOSIS — E782 Mixed hyperlipidemia: Secondary | ICD-10-CM | POA: Diagnosis not present

## 2024-04-30 DIAGNOSIS — E039 Hypothyroidism, unspecified: Secondary | ICD-10-CM | POA: Diagnosis not present

## 2024-04-30 DIAGNOSIS — Z6823 Body mass index (BMI) 23.0-23.9, adult: Secondary | ICD-10-CM | POA: Diagnosis not present

## 2024-04-30 DIAGNOSIS — M150 Primary generalized (osteo)arthritis: Secondary | ICD-10-CM | POA: Diagnosis not present

## 2024-04-30 DIAGNOSIS — F5101 Primary insomnia: Secondary | ICD-10-CM | POA: Diagnosis not present
